# Patient Record
Sex: Female | Born: 1935
Health system: Southern US, Community
[De-identification: ages and names within clinical notes are randomized; demographics above are authoritative.]

## PROBLEM LIST (undated history)

## (undated) DIAGNOSIS — F329 Major depressive disorder, single episode, unspecified: Secondary | ICD-10-CM

## (undated) DIAGNOSIS — M1990 Unspecified osteoarthritis, unspecified site: Secondary | ICD-10-CM

## (undated) DIAGNOSIS — F32A Depression, unspecified: Secondary | ICD-10-CM

## (undated) HISTORY — DX: Depression, unspecified: F32.A

## (undated) HISTORY — PX: BREAST LUMPECTOMY: SHX2

## (undated) HISTORY — PX: ABDOMINAL HYSTERECTOMY: SHX81

## (undated) HISTORY — PX: OTHER SURGICAL HISTORY: SHX169

## (undated) HISTORY — DX: Major depressive disorder, single episode, unspecified: F32.9

## (undated) HISTORY — PX: EYE SURGERY: SHX253

---

## 2006-05-11 DIAGNOSIS — M47819 Spondylosis without myelopathy or radiculopathy, site unspecified: Secondary | ICD-10-CM | POA: Insufficient documentation

## 2007-04-04 ENCOUNTER — Ambulatory Visit: Payer: Self-pay | Admitting: Pain Medicine

## 2007-04-12 ENCOUNTER — Ambulatory Visit: Payer: Self-pay | Admitting: Pain Medicine

## 2007-08-26 ENCOUNTER — Ambulatory Visit: Payer: Self-pay | Admitting: Physician Assistant

## 2008-06-25 ENCOUNTER — Ambulatory Visit: Payer: Self-pay | Admitting: General Practice

## 2008-06-25 ENCOUNTER — Other Ambulatory Visit: Payer: Self-pay

## 2008-07-11 ENCOUNTER — Inpatient Hospital Stay: Payer: Self-pay | Admitting: General Practice

## 2008-07-18 ENCOUNTER — Ambulatory Visit: Payer: Self-pay | Admitting: General Practice

## 2009-03-26 ENCOUNTER — Ambulatory Visit: Payer: Self-pay | Admitting: General Practice

## 2009-04-08 ENCOUNTER — Inpatient Hospital Stay: Payer: Self-pay | Admitting: General Practice

## 2009-08-02 HISTORY — PX: KNEE SURGERY: SHX244

## 2009-12-19 ENCOUNTER — Ambulatory Visit: Payer: Self-pay | Admitting: Internal Medicine

## 2009-12-19 ENCOUNTER — Emergency Department: Payer: Self-pay | Admitting: Emergency Medicine

## 2009-12-25 ENCOUNTER — Ambulatory Visit: Payer: Self-pay | Admitting: Otolaryngology

## 2010-07-16 IMAGING — CT CT MAXILLOFACIAL WITHOUT CONTRAST
1 series · 16 of 30 positions shown, 20 images · non-contrast
Comparison: none

REASON FOR EXAM: s/p fall ecchymosis of right orbital area
COMMENTS:

[Series 2: facial 3.0 h60f · axial · 0.31mm/px · z∈[+1036,+1184]mm · 16 of 53 slices shown, 20 images]
[im 2/53  brain]
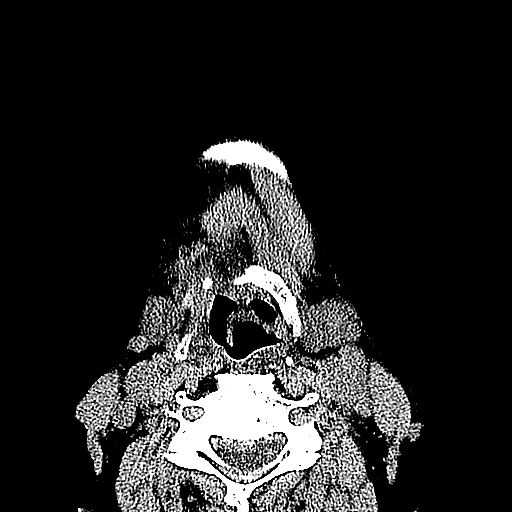
[im 2/53  bone]
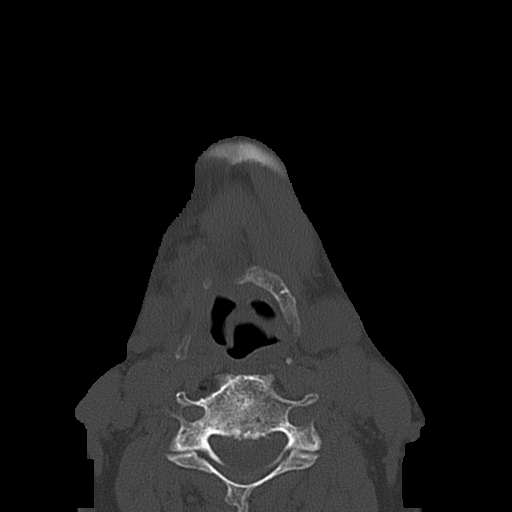
[im 6/53  bone]
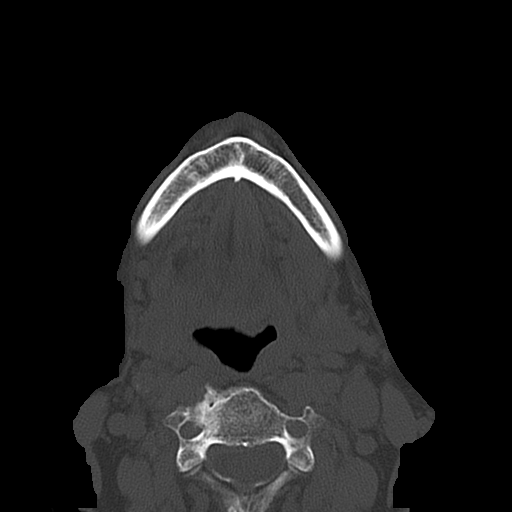
[im 9/53  bone]
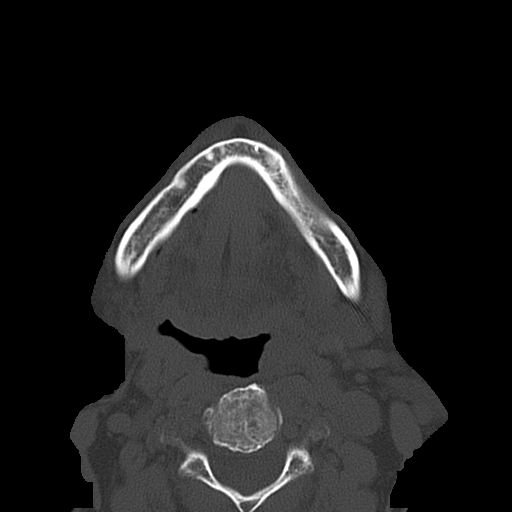
[im 13/53  bone]
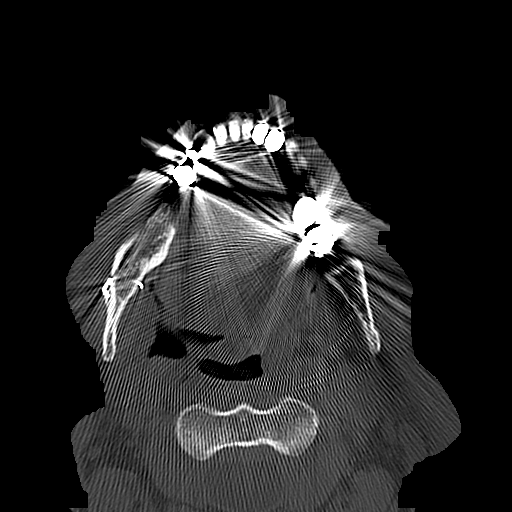
[im 15/53  brain]
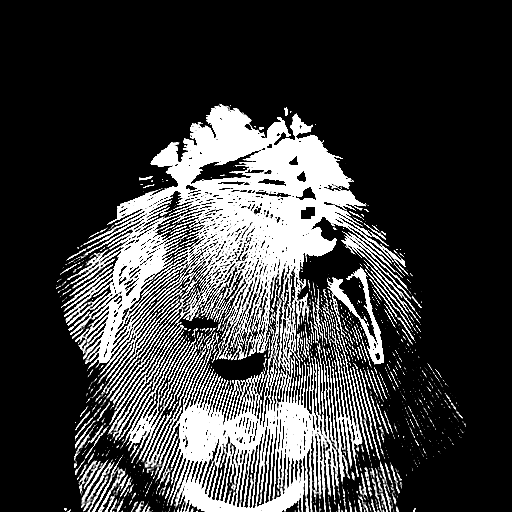
[im 15/53  bone]
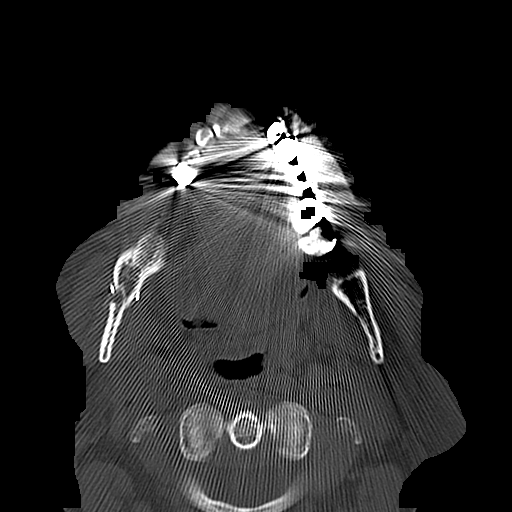
[im 18/53  bone]
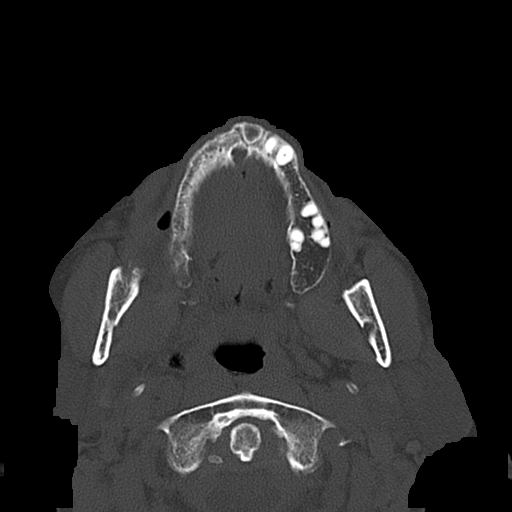
[im 22/53  bone]
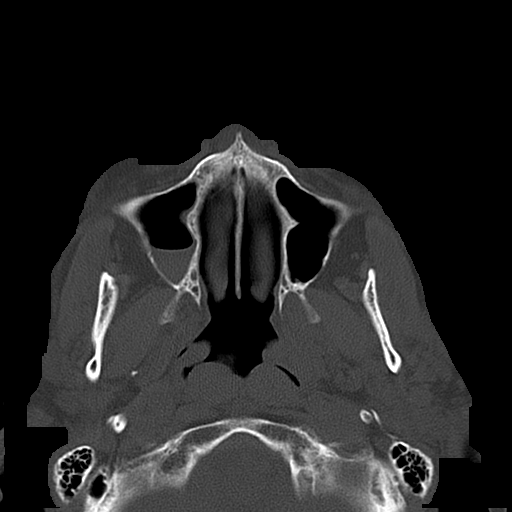
[im 26/53  bone]
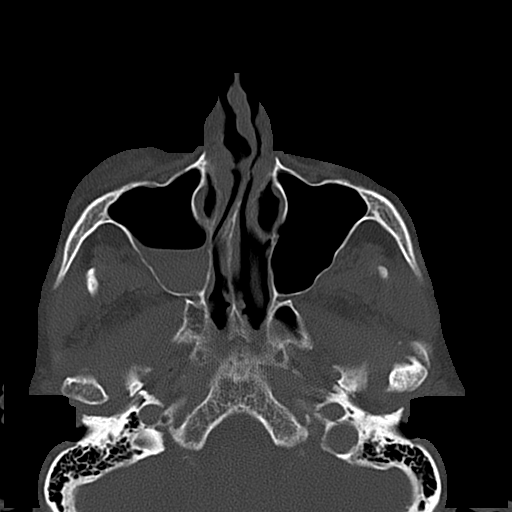
[im 27/53  brain]
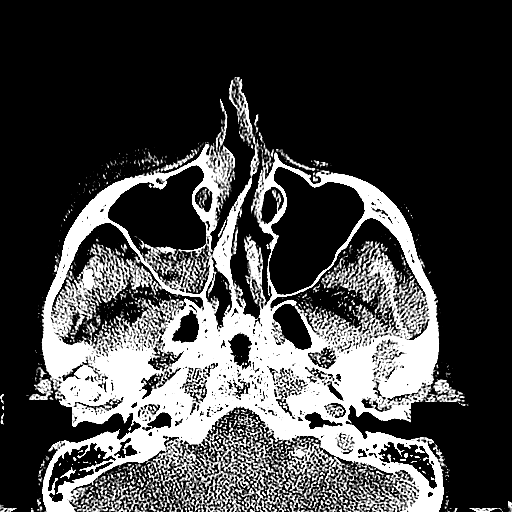
[im 27/53  bone]
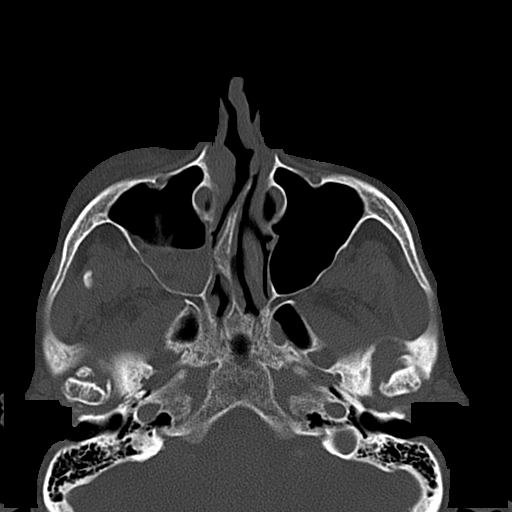
[im 31/53  bone]
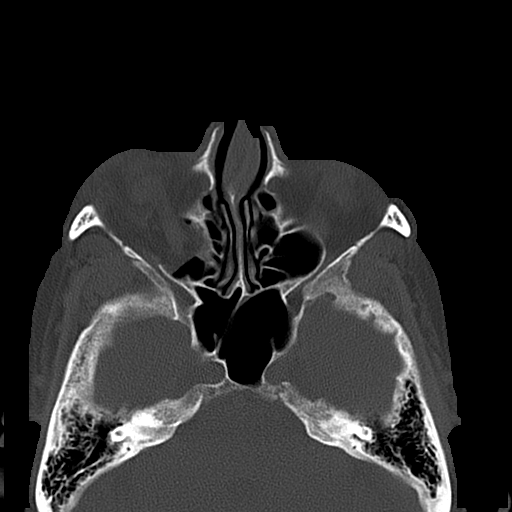
[im 35/53  bone]
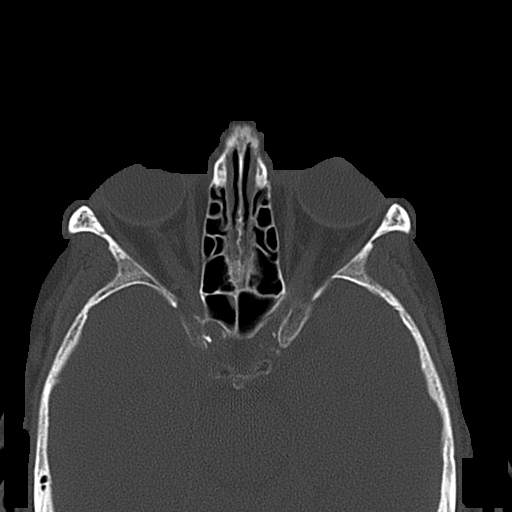
[im 38/53  bone]
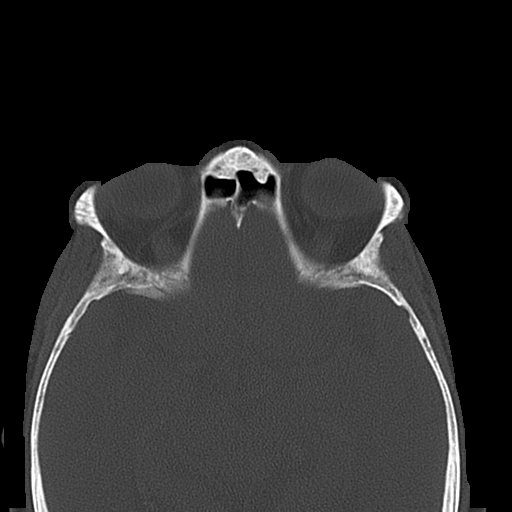
[im 40/53  brain]
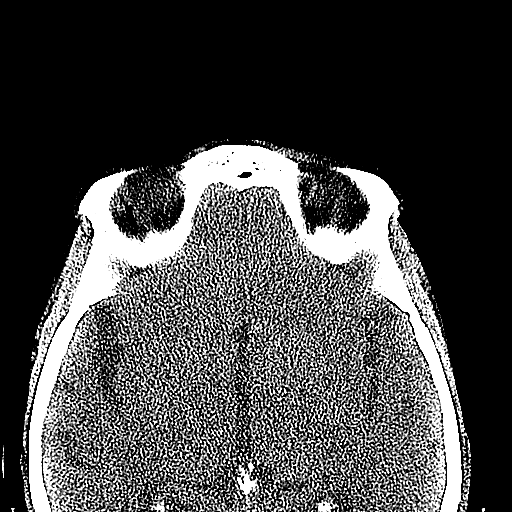
[im 40/53  bone]
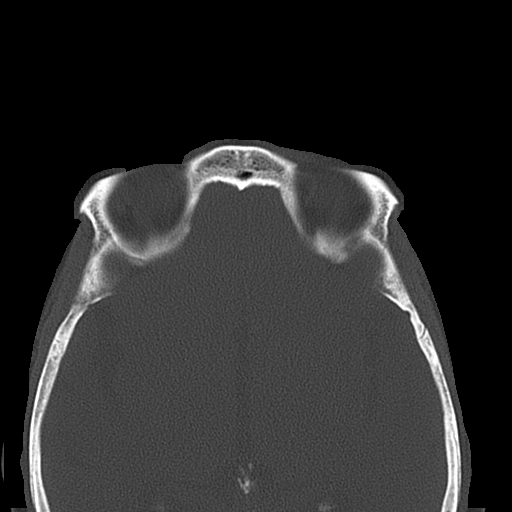
[im 44/53  bone]
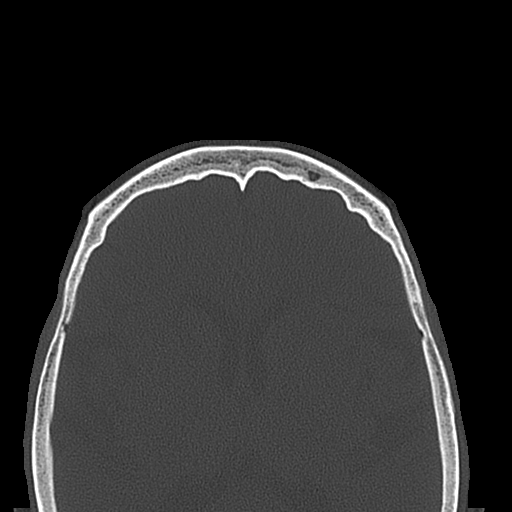
[im 47/53  bone]
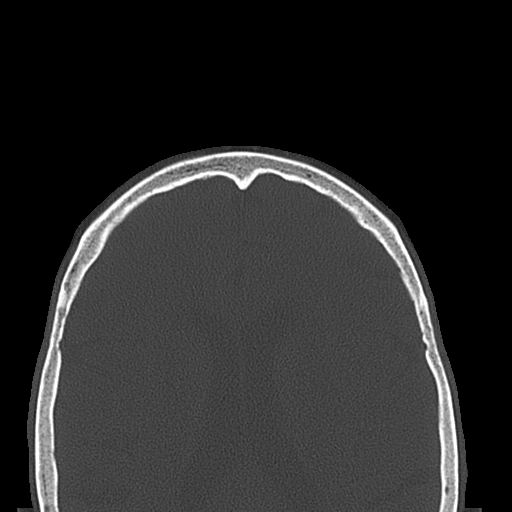
[im 51/53  bone]
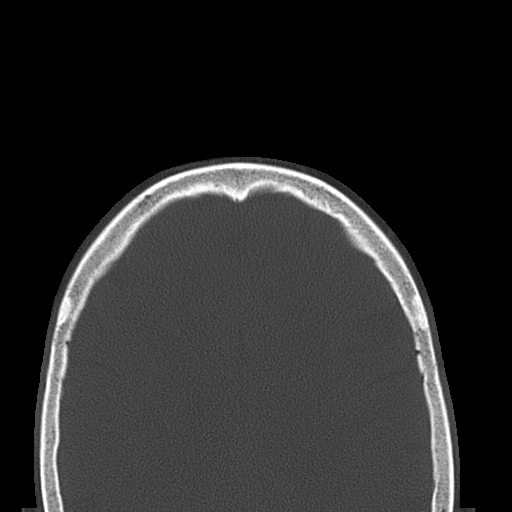

[16 of 30 positions shown; findings below may reference images not displayed]

PROCEDURE:     CT  - CT MAXILLOFACIAL AREA WO  - December 19, 2009  [DATE]

RESULT:     Multislice helical acquisition through the maxillofacial
structures is reconstructed in the axial and coronal planes at bone window
settings at 3 mm slice thickness. There is no previous exam for comparison.

There is an air-fluid level in the right maxillary sinus there is a
depressed orbital floor fracture on the right without evidence of
impingement. The orbital walls are otherwise intact. The sinuses are
otherwise normally formed. The pterygoid plates are intact. The mandible is
intact.
IMPRESSION: Right orbital floor fracture with depressed fragments. No
definite impingement is seen area air-fluid level is seen in the right
maxillary sinus.

## 2010-10-31 ENCOUNTER — Ambulatory Visit: Payer: Self-pay | Admitting: Internal Medicine

## 2010-11-19 ENCOUNTER — Ambulatory Visit: Payer: Self-pay | Admitting: Internal Medicine

## 2010-12-03 ENCOUNTER — Ambulatory Visit: Payer: Self-pay | Admitting: Internal Medicine

## 2011-01-01 ENCOUNTER — Ambulatory Visit: Payer: Self-pay | Admitting: Internal Medicine

## 2011-05-01 ENCOUNTER — Other Ambulatory Visit: Payer: Self-pay | Admitting: Orthopedic Surgery

## 2011-05-01 ENCOUNTER — Other Ambulatory Visit (HOSPITAL_COMMUNITY): Payer: Self-pay | Admitting: Orthopedic Surgery

## 2011-05-01 ENCOUNTER — Encounter (HOSPITAL_COMMUNITY): Payer: Medicare Other

## 2011-05-01 ENCOUNTER — Ambulatory Visit (HOSPITAL_COMMUNITY)
Admission: RE | Admit: 2011-05-01 | Discharge: 2011-05-01 | Disposition: A | Discharge status: Home / Self Care | Payer: Medicare Other | Source: Ambulatory Visit | Attending: Orthopedic Surgery | Admitting: Orthopedic Surgery

## 2011-05-01 DIAGNOSIS — M1610 Unilateral primary osteoarthritis, unspecified hip: Secondary | ICD-10-CM | POA: Insufficient documentation

## 2011-05-01 DIAGNOSIS — M25559 Pain in unspecified hip: Secondary | ICD-10-CM | POA: Insufficient documentation

## 2011-05-01 DIAGNOSIS — Z01818 Encounter for other preprocedural examination: Secondary | ICD-10-CM

## 2011-05-01 DIAGNOSIS — M169 Osteoarthritis of hip, unspecified: Secondary | ICD-10-CM | POA: Insufficient documentation

## 2011-05-01 DIAGNOSIS — Z01812 Encounter for preprocedural laboratory examination: Secondary | ICD-10-CM | POA: Insufficient documentation

## 2011-05-01 DIAGNOSIS — M79609 Pain in unspecified limb: Secondary | ICD-10-CM | POA: Insufficient documentation

## 2011-05-01 LAB — COMPREHENSIVE METABOLIC PANEL
AST: 17 U/L (ref 0–37)
Albumin: 3.9 g/dL (ref 3.5–5.2)
BUN: 18 mg/dL (ref 6–23)
Calcium: 9.1 mg/dL (ref 8.4–10.5)
Creatinine, Ser: 0.73 mg/dL (ref 0.50–1.10)
Total Bilirubin: 1.7 mg/dL — ABNORMAL HIGH (ref 0.3–1.2)
Total Protein: 6 g/dL (ref 6.0–8.3)

## 2011-05-01 LAB — URINALYSIS, ROUTINE W REFLEX MICROSCOPIC
Bilirubin Urine: NEGATIVE
Leukocytes, UA: NEGATIVE
Nitrite: NEGATIVE
Specific Gravity, Urine: 1.026 (ref 1.005–1.030)
Urobilinogen, UA: 0.2 mg/dL (ref 0.0–1.0)
pH: 6 (ref 5.0–8.0)

## 2011-05-01 LAB — PROTIME-INR
INR: 1.06 (ref 0.00–1.49)
Prothrombin Time: 14 seconds (ref 11.6–15.2)

## 2011-05-01 LAB — CBC
MCV: 98.7 fL (ref 78.0–100.0)
Platelets: 155 10*3/uL (ref 150–400)
RBC: 4.71 MIL/uL (ref 3.87–5.11)
RDW: 12.7 % (ref 11.5–15.5)
WBC: 3.8 10*3/uL — ABNORMAL LOW (ref 4.0–10.5)

## 2011-05-01 LAB — SURGICAL PCR SCREEN
MRSA, PCR: NEGATIVE
Staphylococcus aureus: POSITIVE — AB

## 2011-05-01 LAB — APTT: aPTT: 32 seconds (ref 24–37)

## 2011-05-13 ENCOUNTER — Inpatient Hospital Stay (HOSPITAL_COMMUNITY)
Admission: RE | Admit: 2011-05-13 | Discharge: 2011-05-16 | DRG: 470 | Disposition: A | Discharge status: Skilled Nursing Facility | Payer: Medicare Other | Source: Ambulatory Visit | Attending: Orthopedic Surgery | Admitting: Orthopedic Surgery

## 2011-05-13 DIAGNOSIS — M810 Age-related osteoporosis without current pathological fracture: Secondary | ICD-10-CM | POA: Diagnosis present

## 2011-05-13 DIAGNOSIS — Z96659 Presence of unspecified artificial knee joint: Secondary | ICD-10-CM

## 2011-05-13 DIAGNOSIS — H9190 Unspecified hearing loss, unspecified ear: Secondary | ICD-10-CM | POA: Diagnosis present

## 2011-05-13 DIAGNOSIS — K219 Gastro-esophageal reflux disease without esophagitis: Secondary | ICD-10-CM | POA: Diagnosis present

## 2011-05-13 DIAGNOSIS — M1610 Unilateral primary osteoarthritis, unspecified hip: Principal | ICD-10-CM | POA: Diagnosis present

## 2011-05-13 DIAGNOSIS — M169 Osteoarthritis of hip, unspecified: Principal | ICD-10-CM | POA: Diagnosis present

## 2011-05-13 DIAGNOSIS — Z01812 Encounter for preprocedural laboratory examination: Secondary | ICD-10-CM

## 2011-05-13 DIAGNOSIS — Z96649 Presence of unspecified artificial hip joint: Secondary | ICD-10-CM

## 2011-05-13 HISTORY — PX: HIP SURGERY: SHX245

## 2011-05-13 LAB — TYPE AND SCREEN
ABO/RH(D): O POS
Antibody Screen: NEGATIVE

## 2011-05-14 LAB — BASIC METABOLIC PANEL
CO2: 29 mEq/L (ref 19–32)
Calcium: 8.1 mg/dL — ABNORMAL LOW (ref 8.4–10.5)
Creatinine, Ser: 0.81 mg/dL (ref 0.50–1.10)
GFR calc Af Amer: >60 mL/min (ref >60)

## 2011-05-14 LAB — CBC
MCH: 33.4 pg (ref 26.0–34.0)
MCV: 98.7 fL (ref 78.0–100.0)
Platelets: 112 10*3/uL — ABNORMAL LOW (ref 150–400)
RDW: 12.5 % (ref 11.5–15.5)

## 2011-05-15 ENCOUNTER — Encounter: Payer: Self-pay | Admitting: Internal Medicine

## 2011-05-15 LAB — CBC
MCHC: 34.6 g/dL (ref 30.0–36.0)
MCV: 98.6 fL (ref 78.0–100.0)
Platelets: 114 10*3/uL — ABNORMAL LOW (ref 150–400)
RDW: 12.9 % (ref 11.5–15.5)
WBC: 3.2 10*3/uL — ABNORMAL LOW (ref 4.0–10.5)

## 2011-05-15 LAB — BASIC METABOLIC PANEL
Chloride: 107 mEq/L (ref 96–112)
Creatinine, Ser: 0.71 mg/dL (ref 0.50–1.10)
GFR calc Af Amer: >60 mL/min (ref >60)
GFR calc non Af Amer: >60 mL/min (ref >60)

## 2011-05-16 LAB — CBC
HCT: 28 % — ABNORMAL LOW (ref 36.0–46.0)
Platelets: 113 10*3/uL — ABNORMAL LOW (ref 150–400)
RDW: 12.9 % (ref 11.5–15.5)
WBC: 3.5 10*3/uL — ABNORMAL LOW (ref 4.0–10.5)

## 2011-05-16 LAB — BASIC METABOLIC PANEL
BUN: 11 mg/dL (ref 6–23)
Chloride: 108 mEq/L (ref 96–112)
GFR calc Af Amer: >60 mL/min (ref >60)
GFR calc non Af Amer: >60 mL/min (ref >60)
Potassium: 4.2 mEq/L (ref 3.5–5.1)
Sodium: 139 mEq/L (ref 135–145)

## 2011-05-18 NOTE — Discharge Summary (Signed)
Angela Young, Angela Young                ACCOUNT NO.:  [id number]  MEDICAL RECORD NO.:  [id number]  LOCATION:  1611                         FACILITY:  [facility]  PHYSICIAN:  Ollen Gross, M.D.    DATE OF BIRTH:  1931-07-05  DATE OF ADMISSION:  05/13/2011 DATE OF DISCHARGE:                        DISCHARGE SUMMARY - REFERRING   TENTATIVE DATE OF DISCHARGE:  May 16, 2011.  ADMITTING DIAGNOSES: 1. Osteoarthritis, right hip. 2. Impaired hearing. 3. Hiatal hernia. 4. Acid reflux. 5. Osteoporosis. 6. Postmenopausal.  DISCHARGE DIAGNOSES: 1. Osteoarthritis, right hip, status post right total hip replacement     arthroplasty. 2. Mild postop hypokalemia. 3. Impaired hearing. 4. Hiatal hernia. 5. Acid reflux. 6. Osteoporosis. 7. Postmenopausal.  PROCEDURE:  May 13, 2011, right total hip.  Surgeon, Dr. Lequita Halt. Assistant, Alexzandrew L. Perkins, P.A.C.  Surgery performed under anesthesia.  CONSULTS:  None.  BRIEF HISTORY:  [name] is a 77 year old female with end-stage arthritis of the right hip with progressive worsening pain and dysfunction.  She failed nonoperative management, now presents for total hip arthroplasty.  LABORATORY DATA:  CBC on admission showed hemoglobin of 15.3, hematocrit of 46.5 white cell count 30.8, platelets 155.  PT/INR 14.0 and 1.06 with PTT of 32.  Chem panel on admission, slightly elevated total bili of 1.7.  Remaining Chem panel all within normal limits.  Preop UA negative. Blood group type O+.  Nasal swabs were positive staph aureus but negative for MRSA.  Serial CBCs were followed.  Hemoglobin dropped down to 10.6 on postop day #2, last noted H and H was 10.1 and 29.2.  Please note, final CBC is pending on the tentative date of discharge.  Serial BMETs were followed for 48 hours.  Potassium dropped from 4.1 to 3.4. She was given oral potassium supplements.  Please note final BMET is pending and will be done on the tentative date of  discharge.  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  EKG, fax copy dated February 26, 2011, sinus rhythm, borderline, abnormal changes possibly due to myocardial ischemia.  HOSPITAL COURSE:  The patient was admitted to [facility], taken to the operating room, underwent the above-stated procedure without complications.  The patient tolerated the procedure well, later transferred to the recovery room on orthopedic floor, started on p.o. and IV analgesia pain control following surgery, 24 hours postop IV antibiotics.  She was started on Xarelto for DVT prophylaxis.  She was seen on rounds on the morning of day #1 and her daughter and granddaughter were both in the room with her.  She was doing pretty well.  She initially wanted look into a skilled facility, possibly [facility]	[place], so we got social work involved.  We started getting her up, partial weightbearing 25-50% to the right lower extremity.  Hemovac drain placed on the surgery was pulled.  Hemoglobin looked good.  She was diuresing fluids very well.  By day #2, she had already walked over 90 feet with therapy and progressing very well.  We had not heard about the bed yet but tentatively she might be ready to go on Saturday  which would be postop day #3 and on tomorrow but we would have to find out if the bed was available and if the facility would accept discharges over the weekend; if not, she would stay through the weekend receiving therapy each day which she was progressing very well with at this point and possibly either transferred out on Saturday or Monday.  TENTATIVE DISCHARGE PLAN: 1. Possible discharge tomorrow on Saturday, May 16, 2011, depending     upon bed availability and facility ability to accept transfers on     Saturday. 2. Discharge diagnoses, please see above. 3. Discharge meds.  Current medications at  time of dictation: 1. Xarelto 10 mg p.o. daily for 3 weeks, then discontinue the Xarelto. 2. Colace 100 mg p.o. b.i.d. 3. OxyIR 10 mg to 20 mg p.o. q.4-6 h. as needed for severe pain. 4. Robaxin 500 mg p.o. q.6 h. p.r.n. spasm. 5. Benadryl 25 mg p.o. every 6 hours p.r.n. itching. 6. Zofran 4 mg p.o. q.6 h p.r.n. nausea. 7. Tylenol 325 one or two every 4 to 6 hours as needed for mild pain,     temperature or headache. 8. Pepcid AC 10 mg p.o. daily p.r.n. acid reflux/heartburn.  DIET:  As tolerated.  ACTIVITY:  She is partial weightbearing, 25-50% to the right lower extremity.  Hip precautions, total hip protocol.  PT and OT for gait training, ambulation, ADLs, range of motion and strengthening exercises along with hip precautions.  FOLLOW-UP:  Two weeks in the office of Dr. Lequita Halt.  Please contact the office at [phone] to help arrange appointment follow-up care of this patient.  DISPOSITION:  Pending at time of dictation.  CONDITION ON DISCHARGE:  Pending at time of dictation but she will be transferred over either on Saturday or Monday to skilled facility if improved.     Alexzandrew L. Julien Girt, P.A.C.   ______________________________ Ollen Gross, M.D.    ALP/MEDQ  D:  05/15/2011  T:  05/15/2011  Job:  [id number]  Electronically Signed by Patrica Duel P.A.C. on 05/15/2011 09:38:24 AM Electronically Signed by Ollen Gross M.D. on 05/18/2011 06:51:59 AM

## 2011-05-18 NOTE — Op Note (Signed)
Angela Young, Angela Young                ACCOUNT NO.:  [id number]  MEDICAL RECORD NO.:  [id number]  LOCATION:  0004                         FACILITY:  [facility]  PHYSICIAN:  Ollen Gross, M.D.    DATE OF BIRTH:  January 19, 1931  DATE OF PROCEDURE:  05/13/2011 DATE OF DISCHARGE:                              OPERATIVE REPORT   PREOPERATIVE DIAGNOSIS:  Osteoarthritis, right hip.  POSTOPERATIVE DIAGNOSIS:  Osteoarthritis, right hip.  PROCEDURE:  Right total hip arthroplasty.  SURGEON:  Ollen Gross, M.D.  ASSISTANT:  Alexzandrew L. Perkins, P.A.C.  ANESTHESIA:  General.  ESTIMATED BLOOD LOSS:  300.  DRAIN:  Hemovac x1.  COMPLICATIONS:  None.  CONDITION:  Stable to recovery.  BRIEF CLINICAL NOTE:  Ms. Dieterich is a 77 year old female with end-stage arthritis of the right hip with progressively worsening pain and dysfunction.  She has failed postoperative management and presents for total hip arthroplasty.  PROCEDURE IN DETAIL:  After successful administration of general anesthetic, the patient was placed in the left lateral decubitus position with the right side up and held with the hip positioner.  Right lower extremity was isolated from perineum with plastic drapes and prepped and draped in the usual sterile fashion.  Short posterolateral incision was made with a 10 blade through subcutaneous tissue to the fascia lata which was incised in line with the skin incision.  Sciatic nerve was palpated and protected and short rotators and capsule isolated off the femur.  Hip was dislocated and the center of femoral head is marked.  Trial prosthesis was placed such that the center of the trial head corresponds to the center of native femoral head.  Osteotomy line was marked on femoral neck and osteotomy made with an oscillating saw.  The femoral retractors were placed around the proximal femur for access to the canal.  Canal finder was passed into the canal and the canal was thoroughly  irrigated to remove fatty contents.  The lateralizing reamer was placed and then we began broaching all the way up to size 7 for Summit cemented stem.  The calcar planer was then passed over the resection level to make a smooth transition.  The femur was then retracted anteriorly to gain acetabular exposure.  Acetabular retractors were placed and labrum and osteophytes removed.  Reaming was performed up to 55 mm for placement of 56 mm pinnacle acetabular shell.  The shell was impacted in anatomic position.  It had excellent purchase, we did not need any additional dome screws.  The apex hole eliminator was then placed and then the 36 mm neutral +4 marathon liner was placed.  The trial stem was placed which is the 7 and we started with a high offset neck and 36 +1.5 head.  With the high offset, the soft tissues were way too tight fit, so had to go with standard offset which was placed with the 36 +1.5 head.  The hip was reduced with excellent soft tissue tension and excellent stability.  She had full extension, full external rotation, 70 degrees flexion, 40 degrees adduction, 90 degrees internal rotation, 90 degrees of flexion and 70 degrees of internal rotation. Placing the right leg on top of the  left, leg lengths were equal.  Hip was then dislocated and trial was removed.  I sized with a cement restrictor and size 5 was most appropriate.  The size 5 cement restrictor was placed to appropriate depth in the femoral canal.  The canal was then prepared with pulsatile lavage.  While this was occurring, the cement was mixed.  Once ready for implantation the canal was dried out, the cement injected into the femoral canal and pressurized.  The size 7 standard offset Summit cemented stem was then impacted into the femoral canal and cemented around 15-20 degrees of anteversion, matching the patient's native anatomy.  Once cement had fully hardened, then the 36+ 1.5 head was placed on to the  femoral neck and the hip was reduced with the same stability parameters.  The wound was copiously irrigated with saline solution and the short rotators and capsule reattached to the femur through drill holes with Ethibond suture.  Fascia lata was closed over Hemovac drain with interrupted #1 Vicryl, subcu closed with #1 and #2-0 Vicryl and subcuticular running 4- 0 Monocryl.  The catheter for Marcaine pain pump was placed and the pump initiated.  The incisions were cleaned and dried and Steri-Strips and a bulky sterile dressing were applied.  She was placed into a knee immobilizer, awakened and transported to recovery in stable condition.     Ollen Gross, M.D.     FA/MEDQ  D:  05/13/2011  T:  05/13/2011  Job:  [id number]  Electronically Signed by Ollen Gross M.D. on 05/18/2011 06:51:57 AM

## 2011-05-18 NOTE — H&P (Signed)
NAMEWANIYA, HOGLUND                ACCOUNT NO.:  [id number]  MEDICAL RECORD NO.:  [id number]  LOCATION:                                 FACILITY:  PHYSICIAN:  Ollen Gross, M.D.    DATE OF BIRTH:  26-Feb-1931  DATE OF ADMISSION:  05/13/2011 DATE OF DISCHARGE:                             HISTORY & PHYSICAL   CHIEF COMPLAINT:  Right hip pain.  HISTORY OF PRESENT ILLNESS:  [name] is a 77 year old female who was seen in evaluation by Dr. Lequita Halt for ongoing right hip pain.  It started in the groin, it is radiating down towards the knee.  She has had progressive pain and limiting function due to the hip.  She has been seen in the office where x-rays showed advanced end-stage arthritis of the hip with some collapse of the femoral head, some bone-on-bone changes.  She does have a left hip with a well-placed AML stem.  It is felt she would benefit from undergoing right hip replacement.  She has been seen preoperatively by Dr. Lady Gary and felt to be at low-risk from cardiac standpoint for upcoming surgery.  ALLERGIES:  ETHER.  CURRENT MEDICATIONS:  Oxycodone and "energy vitamin."  PAST MEDICAL HISTORY: 1. Impaired hearing. 2. Hiatal hernia. 3. Acid reflux. 4. Osteoporosis. 5. Postmenopausal.  PAST SURGICAL HISTORY:  She has had left total hip in 2009, left total knee in 2010, hysterectomy, hemorrhoidectomy, cyst excision from breast, right eye surgery, and a tumor removal from her throat.  FAMILY HISTORY:  Father with bone cancer.  Mother with a history of hip fracture.  SOCIAL HISTORY:  Divorced, retired Associate Professor, past smoker.  No alcohol.  She lives alone.  She does want to look into a rehab facility.  REVIEW OF SYSTEMS:  GENERAL:  No fevers, chills or night sweats.  NEURO: Little bit of double vision and insomnia.  No seizures, syncope, or paralysis.  RESPIRATORY:  No shortness breath, productive cough, or hemoptysis.  CARDIOVASCULAR:  No chest pain, no orthopnea.   GI:  No nausea, vomiting, diarrhea, or constipation.  GU:  No dysuria, hematuria, or discharge.  MUSCULOSKELETAL: Hip pain.  PHYSICAL EXAMINATION:  VITAL SIGNS:  Pulse 68, respirations 14, blood pressure 132/78. GENERAL:  A 77 year old white female, well nourished, well developed, short stature, alert, oriented and cooperative.  She is accompanied by her granddaughter. HEENT:  Normocephalic, atraumatic.  Pupils round and reactive.  EOMs intact.  Never wore glasses.  Does have upper and lower dentures. NECK:  Supple. CHEST:  Clear. HEART:  Regular rate and rhythm without murmur. ABDOMEN:  Soft, nontender.  Bowel sounds present. RECTAL:  Not done, not pertinent to present illness. BREASTS:  Not done, not pertinent to present illness. GENITALIA:  Not done, not pertinent to present illness. EXTREMITIES:  Right hip flexion 90, 0 internal rotation, 20 degrees external rotation, about 20 degrees abduction.  IMPRESSION:  Osteoarthritis, right hip.  PLAN:  The patient will be admitted to [facility] to undergo right total hip replacement arthroplasty.  Surgery will be performed by Dr. Ollen Gross.     Alexzandrew L. Julien Girt, P.A.C.   ______________________________ Ollen Gross, M.D.    ALP/MEDQ  D:  05/10/2011  T:  05/10/2011  Job:  [id number]  cc:   Lorie Phenix, MD Fax: [phone]  Harold Hedge, MD Fax: [phone]  Electronically Signed by Patrica Duel P.A.C. on 05/14/2011 10:43:47 AM Electronically Signed by Ollen Gross M.D. on 05/18/2011 06:51:55 AM

## 2011-06-03 ENCOUNTER — Encounter: Payer: Self-pay | Admitting: Internal Medicine

## 2011-11-26 IMAGING — CR DG HIP COMPLETE 2+V*R*
3 series · 3 of 3 positions shown · non-contrast
Comparison: None.

CLINICAL DATA: Right hip and leg pain.  Preoperative evaluation.

RIGHT HIP - COMPLETE 2+ VIEW

[t pelvis a.p.]
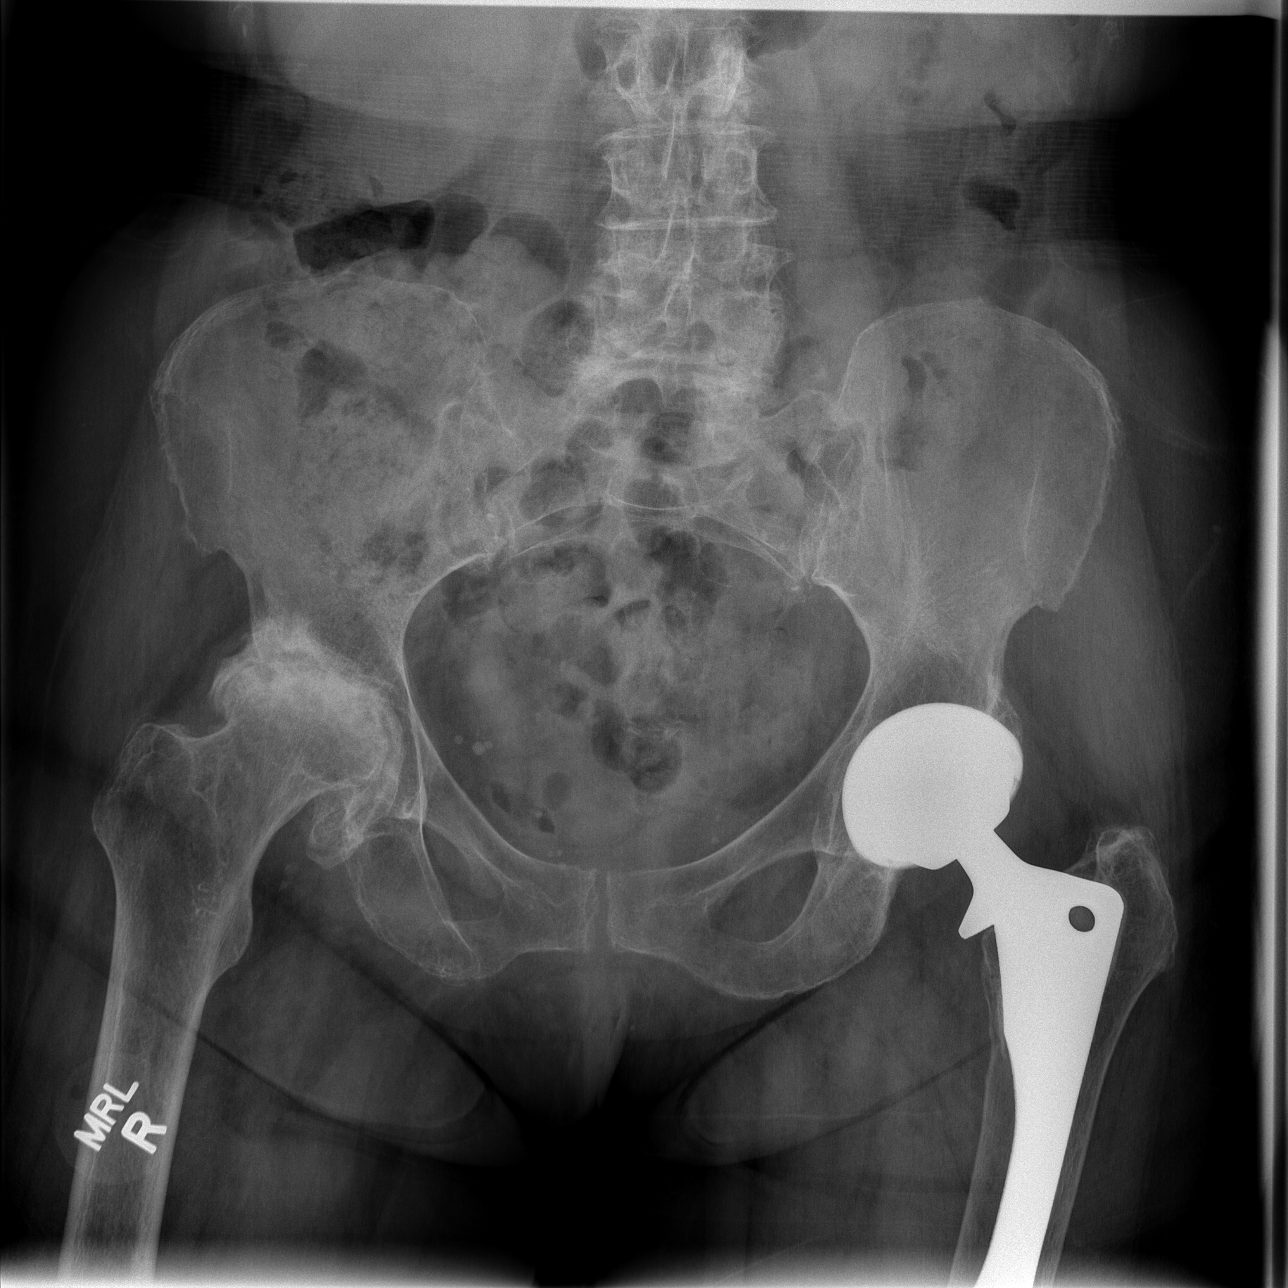

[t hip ap right]
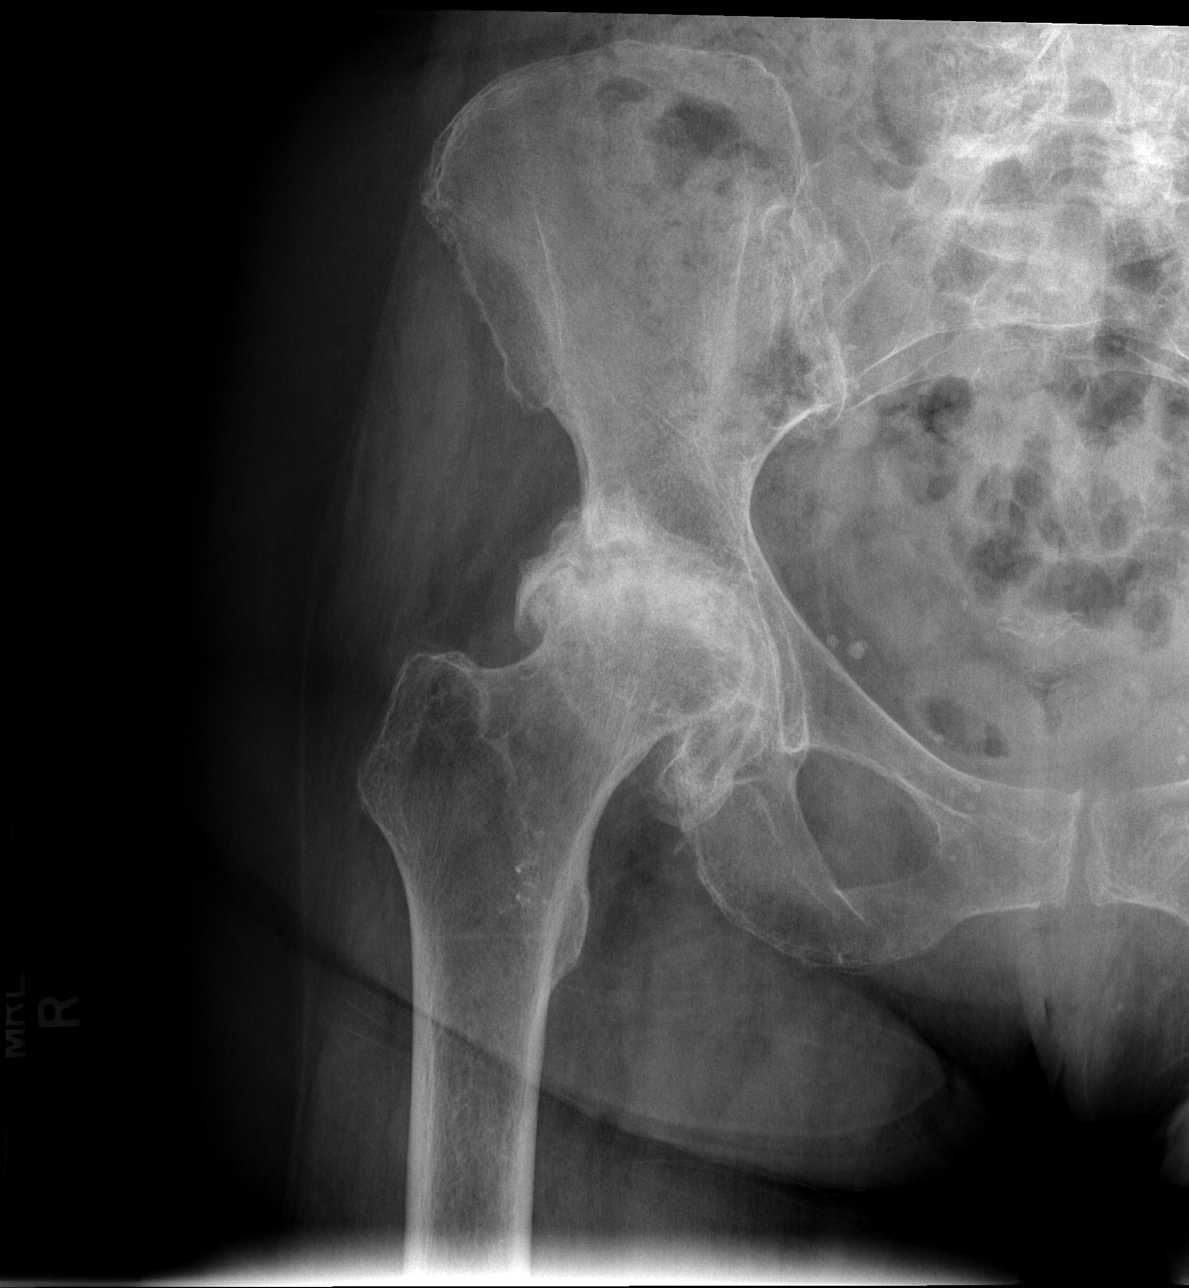

[t hip frog leg right]
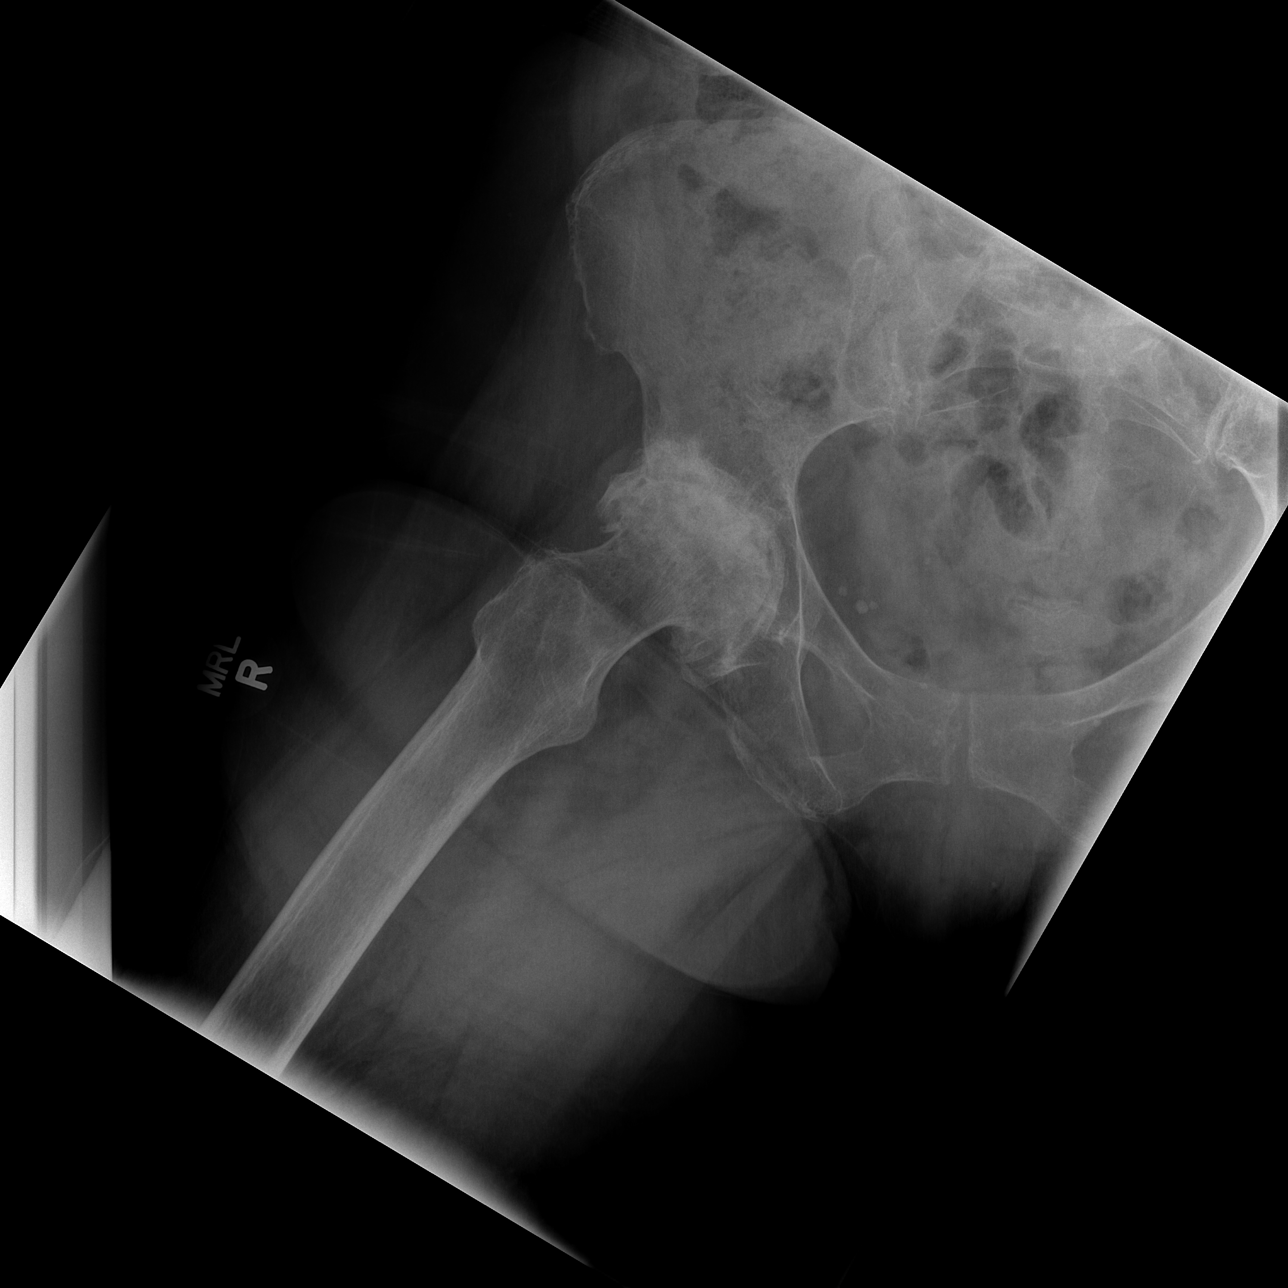

[3 of 3 positions shown; findings below may reference images not displayed]

FINDINGS: Severe right hip superior joint space narrowing with
extensive subarticular sclerosis and lucency with bony remodeling
and flattening of the femoral head. Extensive spur formation.  Left
total hip prosthesis in satisfactory position and alignment.  Lower
lumbar spine degenerative changes and mild scoliosis.
IMPRESSION: Severe right hip degenerative changes with probable underlying
avascular necrosis of the femoral head.

## 2015-04-30 ENCOUNTER — Other Ambulatory Visit: Payer: Self-pay | Admitting: Family Medicine

## 2015-04-30 DIAGNOSIS — M1990 Unspecified osteoarthritis, unspecified site: Secondary | ICD-10-CM | POA: Insufficient documentation

## 2015-04-30 MED ORDER — OXYCODONE HCL 10 MG PO TABS: ORAL_TABLET | ORAL | Status: DC

## 2015-04-30 MED ORDER — OXYCODONE HCL 10 MG PO TABS
ORAL_TABLET | ORAL | Status: DC
Start: 2015-04-30 — End: 2015-08-02

## 2015-04-30 NOTE — Telephone Encounter (Signed)
Patient advised to pick up prescriptions at front desk.

## 2015-04-30 NOTE — Telephone Encounter (Signed)
Pt contacted office for refill request on the following medications:  Oxycodone HCI . Pt is requesting a 90 day supply.  CB#[id number]

## 2015-08-02 ENCOUNTER — Other Ambulatory Visit: Payer: Self-pay | Admitting: Family Medicine

## 2015-08-02 DIAGNOSIS — M1990 Unspecified osteoarthritis, unspecified site: Secondary | ICD-10-CM

## 2015-08-02 MED ORDER — OXYCODONE HCL 10 MG PO TABS: ORAL_TABLET | ORAL | Status: DC

## 2015-08-02 NOTE — Telephone Encounter (Signed)
Pt needs refill on her Oxycodone HCl 10 MG TABS 04/30/15 -- Lorie Phenix, MD 1 tablet four times daily Notes: To be filled after 06/30/2015   Her call back is [phone]  Thanks teri

## 2015-09-09 ENCOUNTER — Encounter: Payer: Self-pay | Admitting: Family Medicine

## 2015-09-09 ENCOUNTER — Other Ambulatory Visit: Payer: Self-pay

## 2015-09-09 ENCOUNTER — Ambulatory Visit (INDEPENDENT_AMBULATORY_CARE_PROVIDER_SITE_OTHER): Payer: Medicare Other | Admitting: Family Medicine

## 2015-09-09 VITALS — BP 144/80 | HR 76 | Temp 97.8°F | Resp 16 | Ht 61.0 in | Wt 137.0 lb

## 2015-09-09 DIAGNOSIS — N6009 Solitary cyst of unspecified breast: Secondary | ICD-10-CM | POA: Insufficient documentation

## 2015-09-09 DIAGNOSIS — R9431 Abnormal electrocardiogram [ECG] [EKG]: Secondary | ICD-10-CM | POA: Insufficient documentation

## 2015-09-09 DIAGNOSIS — J309 Allergic rhinitis, unspecified: Secondary | ICD-10-CM | POA: Insufficient documentation

## 2015-09-09 DIAGNOSIS — Z6826 Body mass index (BMI) 26.0-26.9, adult: Secondary | ICD-10-CM | POA: Insufficient documentation

## 2015-09-09 DIAGNOSIS — F329 Major depressive disorder, single episode, unspecified: Secondary | ICD-10-CM | POA: Diagnosis not present

## 2015-09-09 DIAGNOSIS — IMO0002 Reserved for concepts with insufficient information to code with codable children: Secondary | ICD-10-CM | POA: Insufficient documentation

## 2015-09-09 DIAGNOSIS — K449 Diaphragmatic hernia without obstruction or gangrene: Secondary | ICD-10-CM | POA: Insufficient documentation

## 2015-09-09 DIAGNOSIS — M1990 Unspecified osteoarthritis, unspecified site: Secondary | ICD-10-CM

## 2015-09-09 DIAGNOSIS — E559 Vitamin D deficiency, unspecified: Secondary | ICD-10-CM | POA: Insufficient documentation

## 2015-09-09 DIAGNOSIS — R5382 Chronic fatigue, unspecified: Secondary | ICD-10-CM

## 2015-09-09 DIAGNOSIS — L309 Dermatitis, unspecified: Secondary | ICD-10-CM

## 2015-09-09 DIAGNOSIS — D8989 Other specified disorders involving the immune mechanism, not elsewhere classified: Secondary | ICD-10-CM | POA: Insufficient documentation

## 2015-09-09 DIAGNOSIS — Z862 Personal history of diseases of the blood and blood-forming organs and certain disorders involving the immune mechanism: Secondary | ICD-10-CM | POA: Insufficient documentation

## 2015-09-09 DIAGNOSIS — G9332 Myalgic encephalomyelitis/chronic fatigue syndrome: Secondary | ICD-10-CM | POA: Insufficient documentation

## 2015-09-09 DIAGNOSIS — K219 Gastro-esophageal reflux disease without esophagitis: Secondary | ICD-10-CM | POA: Insufficient documentation

## 2015-09-09 DIAGNOSIS — F32A Depression, unspecified: Secondary | ICD-10-CM

## 2015-09-09 MED ORDER — MIRTAZAPINE 15 MG PO TABS: 15.0000 mg | ORAL_TABLET | Freq: Every day | ORAL | Status: DC

## 2015-09-09 MED ORDER — OXYCODONE HCL 10 MG PO TABS: ORAL_TABLET | ORAL | Status: DC

## 2015-09-09 NOTE — Progress Notes (Signed)
Subjective:    Patient ID: Angela Young, female    DOB: 08/13/1931, 81 y.o.   MRN: [id number]  Joint/Muscle Pain: Patient complains of arthralgias for which has been present for several years. Pain is located in multiple joints, is described as aching, and is severe .  Associated symptoms include: decreased range of motion and instability.  The patient has tried oxycodone 10 mg for pain, with moderate relief.  Related to injury:   No.  Meds help enough for her to do her thing.    Depression        This is a chronic problem.The problem is unchanged.  Associated symptoms include fatigue, helplessness, insomnia, irritable, restlessness, appetite change, body aches and myalgias.  Associated symptoms include no decreased concentration, no hopelessness, no decreased interest, no headaches, no indigestion, not sad and no suicidal ideas.  Treatments tried: Celexa.  Previous treatment provided no relief relief.  Past medical history includes chronic pain.    Also has had to get off of gluten for a skin problem.  Is better but not gone. Dermatologist says she has dermatitis.  Using one percent cortisone over the counter.     Review of Systems  Constitutional: Positive for appetite change and fatigue.  HENT: Positive for trouble swallowing.   Musculoskeletal: Positive for myalgias, arthralgias and gait problem.  Neurological: Negative for headaches.  Psychiatric/Behavioral: Positive for depression. Negative for suicidal ideas and decreased concentration. The patient has insomnia.    BP 144/80 mmHg  Pulse 76  Temp(Src) 97.8 F (36.6 C) (Oral)  Resp 16  Ht  (1.549 m)  Wt 137 lb (62.143 kg)  BMI 25.90 kg/m2   Patient Active Problem List   Diagnosis Date Noted  . Abnormal ECG 09/09/2015  . Allergic rhinitis 09/09/2015  . Benign cyst of breast 09/09/2015  . Body mass index (BMI) of 26.0-26.9 in adult 09/09/2015  . CFIDS (chronic fatigue and immune dysfunction syndrome) 09/09/2015  .  Clinical depression 09/09/2015  . Bladder cystocele 09/09/2015  . Acid reflux 09/09/2015  . 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 Polycythemia 09/09/2015  . Avitaminosis D 09/09/2015  . Chronic osteoarthritis 04/30/2015  . Spondylosis without myelopathy 05/11/2006   No past medical history on file. Current Outpatient Prescriptions on File Prior to Visit  Medication Sig  . Oxycodone HCl 10 MG TABS 1 tablet four times daily   No current facility-administered medications on file prior to visit.   Allergies  Allergen Reactions  . Ether    Depression screen PHQ 2/9 09/09/2015  Decreased Interest 1  Down, Depressed, Hopeless 3  PHQ - 2 Score 4  Altered sleeping 1  Tired, decreased energy 3  Change in appetite 1  Feeling bad or failure about yourself  0  Trouble concentrating 0  Moving slowly or fidgety/restless 0  Suicidal thoughts 0  PHQ-9 Score 9       Objective:   Physical Exam  Constitutional: She is oriented to person, place, and time. She appears well-developed and well-nourished. She is irritable.  Cardiovascular: Normal rate and regular rhythm.   Pulmonary/Chest: Effort normal and breath sounds normal.  Neurological: She is alert and oriented to person, place, and time.  Psychiatric: She has a normal mood and affect. Her behavior is normal. Judgment and thought content normal.   BP 144/80 mmHg  Pulse 76  Temp(Src) 97.8 F (36.6 C) (Oral)  Resp 16  Ht  (1.549 m)  Wt 137 lb (62.143 kg)  BMI 25.90 kg/m2  Assessment & Plan:  1. Dermatitis Continue hydrocortisone as needed.   2. Clinical depression Not controlled but patient is doing the best she can. Will try Mirtazepine. Call if worsens or does not improve.  - mirtazapine (REMERON) 15 MG tablet; Take 1 tablet (15 mg total) by mouth at bedtime.  Dispense: 30 tablet; Refill: 5  3. Chronic osteoarthritis Stable. Continue current medication and plan of care.    - Oxycodone HCl 10 MG TABS; 1 tablet four  times daily  Dispense: 120 tablet; Refill: 0  Lorie PhenixNancy Darlene Bartelt, MD

## 2015-11-27 ENCOUNTER — Other Ambulatory Visit: Payer: Self-pay | Admitting: Family Medicine

## 2015-11-27 DIAGNOSIS — M1990 Unspecified osteoarthritis, unspecified site: Secondary | ICD-10-CM

## 2015-11-27 MED ORDER — OXYCODONE HCL 10 MG PO TABS: ORAL_TABLET | ORAL | Status: DC

## 2015-11-27 NOTE — Telephone Encounter (Signed)
LOV 09/09/2015. Med to be refilled after 12/02/2015. Allene Dillon, CMA

## 2015-11-27 NOTE — Telephone Encounter (Signed)
Pt needs refill Oxycodone HCl 10 MG TABS 09/09/15 -- Lorie Phenix, MD 1 tablet four times daily   Thanks Barth Kirks

## 2015-12-06 ENCOUNTER — Telehealth: Payer: Self-pay | Admitting: Family Medicine

## 2015-12-06 ENCOUNTER — Other Ambulatory Visit: Payer: Self-pay

## 2015-12-06 DIAGNOSIS — M1990 Unspecified osteoarthritis, unspecified site: Secondary | ICD-10-CM

## 2015-12-06 MED ORDER — OXYCODONE HCL 10 MG PO TABS: ORAL_TABLET | ORAL | Status: DC

## 2015-12-06 NOTE — Telephone Encounter (Signed)
Prescriptions are on your desk to sign.  Thanks,   -Vernona Rieger

## 2015-12-06 NOTE — Telephone Encounter (Signed)
Ok to print out two more months. Thanks.

## 2015-12-06 NOTE — Telephone Encounter (Signed)
Signed. Thanks.

## 2015-12-06 NOTE — Telephone Encounter (Signed)
Pt called to pick up her Rx for Oxycodone HCl 10 MG TABS 11/27/15 -- Malva Limes, MD 1 tablet four times daily.  She said she picks up three months at a time and there was only one month ready.  She wants to know if she can get the other two months written before she comes this afternoon to pick up.  Her call back is  [phone]  Thanks Barth Kirks

## 2015-12-13 ENCOUNTER — Ambulatory Visit (INDEPENDENT_AMBULATORY_CARE_PROVIDER_SITE_OTHER): Payer: Medicare Other | Admitting: Family Medicine

## 2015-12-13 ENCOUNTER — Ambulatory Visit: Payer: Medicare Other | Admitting: Family Medicine

## 2015-12-13 ENCOUNTER — Encounter: Payer: Self-pay | Admitting: Family Medicine

## 2015-12-13 VITALS — BP 140/86 | HR 60 | Temp 97.6°F | Resp 16

## 2015-12-13 DIAGNOSIS — R350 Frequency of micturition: Secondary | ICD-10-CM

## 2015-12-13 DIAGNOSIS — R202 Paresthesia of skin: Secondary | ICD-10-CM | POA: Insufficient documentation

## 2015-12-13 DIAGNOSIS — E559 Vitamin D deficiency, unspecified: Secondary | ICD-10-CM | POA: Diagnosis not present

## 2015-12-13 DIAGNOSIS — R5382 Chronic fatigue, unspecified: Secondary | ICD-10-CM | POA: Diagnosis not present

## 2015-12-13 DIAGNOSIS — H9193 Unspecified hearing loss, bilateral: Secondary | ICD-10-CM

## 2015-12-13 LAB — POCT URINALYSIS DIPSTICK
BILIRUBIN UA: NEGATIVE
Blood, UA: NEGATIVE
Glucose, UA: NEGATIVE
KETONES UA: NEGATIVE
Leukocytes, UA: NEGATIVE
Nitrite, UA: NEGATIVE
PH UA: 6
PROTEIN UA: NEGATIVE
SPEC GRAV UA: 1.02
Urobilinogen, UA: 0.2

## 2015-12-13 NOTE — Patient Instructions (Addendum)
Please try over the counter Debrox for ear wax. Try over the counter Loratadine 10 mg one tablet daily for allergies.

## 2015-12-13 NOTE — Progress Notes (Signed)
   Subjective:    Patient ID: Angela Young, female    DOB: 04-06-1931, 82 y.o.   MRN: [id number]  HPI Fatigue: Patient complains of fatigue. Symptoms began several years ago. Sentinal symptom the patient feels fatigue began with: none. Symptoms of her fatigue have been general malaise and lack of interest in usual activities. Patient describes the following psychologic symptoms: depression.  Patient denies fever. Symptoms have gradually worsened. Severity has been struggles to carry out day to day responsibilities.. Previous visits for this problem: none.  Patient reports that several family members have abnormal thyroid levels.  Frequent urination: Patient reports her symptoms are worse when she drinks sweet tea. Patient reports that she does have a prolapse bladder. Patient reports that she also has some foul odor to her urine.   Review of Systems  Constitutional: Positive for activity change, appetite change and fatigue.  Endocrine: Positive for cold intolerance and heat intolerance.  Genitourinary: Positive for frequency.      Blood pressure 140/86, pulse 60, temperature 97.6 F (36.4 C), temperature source Oral, resp. rate 16, SpO2 97 %.  Objective:   Physical Exam  Constitutional: She appears well-developed and well-nourished.  HENT:  Head: Normocephalic and atraumatic.  Right Ear: External ear normal.  Left Ear: External ear normal.  Cardiovascular: Normal rate, regular rhythm, normal heart sounds and intact distal pulses.   Pulmonary/Chest: Effort normal and breath sounds normal.      Assessment & Plan:  1. Chronic fatigue Worsening. F/U pending lab report. - Thyroid Panel With TSH - CBC with Differential/Platelet  2. Paresthesia - Vitamin B12  3. Avitaminosis D - VITAMIN D 25 Hydroxy (Vit-D Deficiency, Fractures)  4. Hard of hearing, bilateral  5. Frequent urination - POCT urinalysis dipstick     Patient seen and examined by Dr. Leo Grosser, and  note scribed by Liz Beach. Dimas, CMA.  I have reviewed the document for accuracy and completeness and I agree with above. Leo Grosser, MD   Lorie Phenix, MD

## 2015-12-14 LAB — CBC WITH DIFFERENTIAL/PLATELET
Basophils Absolute: 0 10*3/uL (ref 0.0–0.2)
Basos: 1 %
EOS (ABSOLUTE): 0 10*3/uL (ref 0.0–0.4)
EOS: 1 %
HEMATOCRIT: 45.2 % (ref 34.0–46.6)
HEMOGLOBIN: 15.3 g/dL (ref 11.1–15.9)
IMMATURE GRANULOCYTES: 0 %
Immature Grans (Abs): 0 10*3/uL (ref 0.0–0.1)
LYMPHS ABS: 1.1 10*3/uL (ref 0.7–3.1)
Lymphs: 32 %
MCH: 32.6 pg (ref 26.6–33.0)
MCHC: 33.8 g/dL (ref 31.5–35.7)
MCV: 96 fL (ref 79–97)
MONOCYTES: 7 %
Monocytes Absolute: 0.3 10*3/uL (ref 0.1–0.9)
NEUTROS PCT: 59 %
Neutrophils Absolute: 2.1 10*3/uL (ref 1.4–7.0)
Platelets: 160 10*3/uL (ref 150–379)
RBC: 4.69 x10E6/uL (ref 3.77–5.28)
RDW: 13.9 % (ref 12.3–15.4)
WBC: 3.6 10*3/uL (ref 3.4–10.8)

## 2015-12-14 LAB — THYROID PANEL WITH TSH
Free Thyroxine Index: 2.5 (ref 1.2–4.9)
T3 Uptake Ratio: 38 % (ref 24–39)
T4, Total: 6.5 ug/dL (ref 4.5–12.0)
TSH: 1.36 u[IU]/mL (ref 0.450–4.500)

## 2015-12-14 LAB — VITAMIN D 25 HYDROXY (VIT D DEFICIENCY, FRACTURES): Vit D, 25-Hydroxy: 18.7 ng/mL — ABNORMAL LOW (ref 30.0–100.0)

## 2015-12-14 LAB — VITAMIN B12: Vitamin B-12: 580 pg/mL (ref 211–946)

## 2015-12-16 ENCOUNTER — Telehealth: Payer: Self-pay

## 2015-12-16 NOTE — Telephone Encounter (Signed)
-----   Message from Lorie Phenix, MD sent at 12/14/2015  7:40 AM EST ----- All labs can back normal except Vitamin D.  May be cause of some of fatigue. Start Vitamin D 2000 iu daily and see if helps. Thanks.

## 2015-12-16 NOTE — Telephone Encounter (Signed)
Pt advised as directed below.   Thanks,   -Kari Montero  

## 2016-02-26 ENCOUNTER — Telehealth: Payer: Self-pay | Admitting: Family Medicine

## 2016-02-26 ENCOUNTER — Other Ambulatory Visit: Payer: Self-pay | Admitting: Family Medicine

## 2016-02-26 DIAGNOSIS — M1990 Unspecified osteoarthritis, unspecified site: Secondary | ICD-10-CM

## 2016-02-26 MED ORDER — OXYCODONE HCL 10 MG PO TABS: ORAL_TABLET | ORAL | Status: DC

## 2016-02-26 NOTE — Telephone Encounter (Signed)
Called but no answer-aa

## 2016-02-26 NOTE — Telephone Encounter (Signed)
Printed out 3 months. Recommend follow up with Dr. Sherrie MustacheFisher in July for refills. Thanks.

## 2016-02-26 NOTE — Telephone Encounter (Signed)
Pt contacted office for refill request on the following medications: Oxycodone HCl 10 MG TABS. Pt would like to pick up 3 hard scripts so it will last for 3 months. Pt stated that she isn't sure when it is due but she doesn't want to run out. Pt stated that she doesn't know when she will run out of the medication. Thanks TNP

## 2016-02-27 NOTE — Telephone Encounter (Signed)
Pt informed. Angela Young, CMA  

## 2016-05-22 ENCOUNTER — Other Ambulatory Visit: Payer: Self-pay | Admitting: Family Medicine

## 2016-05-22 ENCOUNTER — Ambulatory Visit: Payer: Medicare Other | Admitting: Family Medicine

## 2016-05-22 ENCOUNTER — Other Ambulatory Visit: Payer: Self-pay

## 2016-05-22 DIAGNOSIS — M1990 Unspecified osteoarthritis, unspecified site: Secondary | ICD-10-CM

## 2016-05-22 MED ORDER — PREDNISONE 10 MG PO TABS: ORAL_TABLET | ORAL | Status: DC

## 2016-05-22 MED ORDER — OXYCODONE HCL 10 MG PO TABS: ORAL_TABLET | ORAL | Status: DC

## 2016-05-22 NOTE — Telephone Encounter (Signed)
Pt reports she is going to the beach the first weekend in August.  She is going to need a refill before she goes.  I advised her she would need a follow up visit to establish with Dr. Sherrie MustacheFisher she agreed but it looks like is schedule booked.  Please advise.  Thanks,   -Vernona RiegerLaura

## 2016-06-15 ENCOUNTER — Ambulatory Visit (INDEPENDENT_AMBULATORY_CARE_PROVIDER_SITE_OTHER): Payer: Medicare Other | Admitting: Family Medicine

## 2016-06-15 ENCOUNTER — Encounter: Payer: Self-pay | Admitting: Family Medicine

## 2016-06-15 VITALS — BP 128/78 | HR 74 | Temp 97.4°F | Resp 16 | Ht 61.75 in | Wt 134.0 lb

## 2016-06-15 DIAGNOSIS — D8989 Other specified disorders involving the immune mechanism, not elsewhere classified: Secondary | ICD-10-CM

## 2016-06-15 DIAGNOSIS — G9332 Myalgic encephalomyelitis/chronic fatigue syndrome: Secondary | ICD-10-CM

## 2016-06-15 DIAGNOSIS — M1990 Unspecified osteoarthritis, unspecified site: Secondary | ICD-10-CM | POA: Diagnosis not present

## 2016-06-15 DIAGNOSIS — E559 Vitamin D deficiency, unspecified: Secondary | ICD-10-CM | POA: Diagnosis not present

## 2016-06-15 DIAGNOSIS — R5382 Chronic fatigue, unspecified: Secondary | ICD-10-CM | POA: Diagnosis not present

## 2016-06-15 MED ORDER — OXYCODONE HCL 10 MG PO TABS: ORAL_TABLET | ORAL | 0 refills | Status: DC

## 2016-06-15 NOTE — Progress Notes (Signed)
       Patient: Angela DexterGlenda M Young Female    DOB: July 13, 1931   82 y.o.   MRN: [id number] Visit Date: 06/15/2016  Today's Provider: Mila Merryonald Fisher, MD   Chief Complaint  Patient presents with  . Follow-up  . Depression  . Osteoarthritis   Subjective:    HPI  Clinical depression: From 09/09/2015-started mirtazapine (REMERON) 15 MG Take 1 tablet (15 mg total) by mouth at bedtime.   Chronic osteoarthritis: From 09/09/2015-no changes, Continue current medication and plan of care.    Avitaminosis D: From 12/13/2015-started VITAMIN D 2000 units qd.    Allergies  Allergen Reactions  . Ether    No outpatient prescriptions have been marked as taking for the 06/15/16 encounter (Office Visit) with Malva Limesonald E Fisher, MD.    Review of Systems  Constitutional: Negative for appetite change, chills, fatigue and fever.  Respiratory: Negative for chest tightness and shortness of breath.   Cardiovascular: Negative for chest pain and palpitations.  Gastrointestinal: Negative for abdominal pain, nausea and vomiting.  Neurological: Negative for dizziness and weakness.    Social History  Substance Use Topics  . Smoking status: Former Games developermoker  . Smokeless tobacco: Never Used  . Alcohol use No   Objective:   There were no vitals taken for this visit.  Physical Exam      Assessment & Plan:           Mila Merryonald Fisher, MD  [facility]

## 2016-06-15 NOTE — Progress Notes (Signed)
Patient: Angela Young Female    DOB: 1931/10/11   82 y.o.   MRN: [id number] Visit Date: 06/15/2016  Today's Provider: Mila Merryonald Fisher, MD   Chief Complaint  Patient presents with  . Follow-up    Vitamin D deficiency  . Osteoarthritis   Subjective:    HPI   This is a previous patient of Dr. Elease HashimotoMaloney present today as new patient to me to establish care and follow up on chronic medical problems.   Follow up Chronic Osteoarthritis: Patient was last seen for this problem 9 months ago and no changes were made. Patient reports good compliance with treatment, good tolerance and good symptom control.    Follow up Vitamin D Deficiency: Patient was last seen for this problem 6 months ago. Changes made during that visit includes starting Vitamin D 2,000 units daily. Patient comes in today stating she is not taking Vitamin D supplements right now because she feels that she does not need them.  She thinks she may have had a bit more energy when she was taking it.     Allergies  Allergen Reactions  . Ether    Current Meds  Medication Sig  . caffeine 200 MG TABS tablet Take 200 mg by mouth every 4 (four) hours as needed.  . magnesium 30 MG tablet Take 30 mg by mouth 2 (two) times daily.  . Multiple Vitamins-Minerals (ENERGY BOOSTER) PACK Take by mouth.  . Oxycodone HCl 10 MG TABS 1 tablet four times daily    Review of Systems  Constitutional: Positive for fatigue. Negative for appetite change, chills and fever.  Respiratory: Negative for chest tightness and shortness of breath.   Cardiovascular: Negative for chest pain and palpitations.  Gastrointestinal: Negative for abdominal pain, nausea and vomiting.  Musculoskeletal: Positive for arthralgias.  Neurological: Negative for dizziness and weakness.  Psychiatric/Behavioral: Positive for dysphoric mood.    Social History  Substance Use Topics  . Smoking status: Former Games developermoker  . Smokeless tobacco: Never Used  . Alcohol use No     Objective:   BP 128/78 (BP Location: Left Arm, Patient Position: Sitting, Cuff Size: Small)   Pulse 74   Temp 97.4 F (36.3 C) (Oral)   Resp 16   Ht 5' 1.75" (1.568 m)   Wt 134 lb (60.8 kg)   SpO2 96% Comment: room air  BMI 24.71 kg/m   Physical Exam   General Appearance:    Alert, cooperative, no distress  Eyes:    PERRL, conjunctiva/corneas clear, EOM's intact       Lungs:     Clear to auscultation bilaterally, respirations unlabored  Heart:    Regular rate and rhythm  Neurologic:   Awake, alert, oriented x 3. No apparent focal neurological           defect.           Assessment & Plan:     1. Chronic osteoarthritis She feels pain is well controlled current regiment. Refilled x 3 today. She requires quad cane for ambulation, completed Handicap parking forms today.  - Oxycodone HCl 10 MG TABS; 1 tablet four times daily  Dispense: 120 tablet; Refill: 0  2. CFIDS (chronic fatigue and immune dysfunction syndrome) Stable   3. Vitamin D deficiency Is going to start back on supplement vitamin D. Counseled on benfits of BMD for osteoporosis screening, but she declined to have done at this time. Will discuss further at follow up visit.   Follow  up 3 months. Check vit d level at follow up.        Mila Merryonald Fisher, MD  [facility]

## 2016-09-16 ENCOUNTER — Ambulatory Visit: Payer: Medicare Other | Admitting: Family Medicine

## 2016-09-23 ENCOUNTER — Other Ambulatory Visit: Payer: Self-pay | Admitting: Family Medicine

## 2016-09-23 DIAGNOSIS — M1990 Unspecified osteoarthritis, unspecified site: Secondary | ICD-10-CM

## 2016-09-23 NOTE — Telephone Encounter (Signed)
Pt contacted office for refill request on the following medications: Oxycodone HCl 10 MG TABS Pt stated she would like to pick this up the first of the week. Please advise. Thanks TNP

## 2016-09-28 ENCOUNTER — Other Ambulatory Visit: Payer: Self-pay | Admitting: Family Medicine

## 2016-09-28 DIAGNOSIS — M1990 Unspecified osteoarthritis, unspecified site: Secondary | ICD-10-CM

## 2016-09-28 MED ORDER — OXYCODONE HCL 10 MG PO TABS: ORAL_TABLET | ORAL | 0 refills | Status: DC

## 2016-11-09 ENCOUNTER — Ambulatory Visit: Payer: Medicare Other | Admitting: Family Medicine

## 2016-12-23 ENCOUNTER — Other Ambulatory Visit: Payer: Self-pay | Admitting: Family Medicine

## 2016-12-23 DIAGNOSIS — M1990 Unspecified osteoarthritis, unspecified site: Secondary | ICD-10-CM

## 2016-12-23 MED ORDER — OXYCODONE HCL 10 MG PO TABS: ORAL_TABLET | ORAL | 0 refills | Status: DC

## 2016-12-23 NOTE — Telephone Encounter (Signed)
Pt contacted office for refill request on the following medications: Oxycodone HCl 10 MG TABS  Please advise. Thanks TNP

## 2016-12-24 ENCOUNTER — Other Ambulatory Visit: Payer: Self-pay | Admitting: Family Medicine

## 2016-12-24 DIAGNOSIS — M1990 Unspecified osteoarthritis, unspecified site: Secondary | ICD-10-CM

## 2016-12-24 MED ORDER — OXYCODONE HCL 10 MG PO TABS: ORAL_TABLET | ORAL | 0 refills | Status: DC

## 2017-03-02 ENCOUNTER — Ambulatory Visit: Payer: Medicare Other | Admitting: Family Medicine

## 2017-03-03 ENCOUNTER — Encounter: Payer: Medicare Other | Admitting: Family Medicine

## 2017-03-03 ENCOUNTER — Ambulatory Visit: Payer: Medicare Other

## 2017-03-05 ENCOUNTER — Telehealth: Payer: Self-pay | Admitting: Family Medicine

## 2017-03-05 NOTE — Telephone Encounter (Signed)
Called Pt to schedule AWV with [facility] - tah

## 2017-03-26 ENCOUNTER — Other Ambulatory Visit: Payer: Self-pay | Admitting: Family Medicine

## 2017-03-26 DIAGNOSIS — M1990 Unspecified osteoarthritis, unspecified site: Secondary | ICD-10-CM

## 2017-03-26 MED ORDER — OXYCODONE HCL 10 MG PO TABS: ORAL_TABLET | ORAL | 0 refills | Status: DC

## 2017-03-26 NOTE — Telephone Encounter (Signed)
Last fill 12/24/16-aa

## 2017-03-26 NOTE — Telephone Encounter (Signed)
Pt contacted office for refill request on the following medications:  Oxycodone HCl 10 MG TABS.  CB#[phone]/MW

## 2017-03-30 ENCOUNTER — Telehealth: Payer: Self-pay | Admitting: Family Medicine

## 2017-03-30 NOTE — Telephone Encounter (Signed)
Tried calling pt, line was busy. Rx's at front desk.

## 2017-03-30 NOTE — Telephone Encounter (Signed)
Please review. KW 

## 2017-03-30 NOTE — Telephone Encounter (Signed)
These were printed on 03-26-2017

## 2017-03-30 NOTE — Telephone Encounter (Signed)
Pt needs refill   Oxycodone HCl 10 MG TABS   03/26/17 -- Malva LimesFisher, Donald E, MD    1 tablet four times daily     Pt requested 3 month seperate orders.  She needs by tomorrow.  teri

## 2017-03-31 ENCOUNTER — Encounter: Payer: Self-pay | Admitting: Family Medicine

## 2017-04-07 ENCOUNTER — Ambulatory Visit (INDEPENDENT_AMBULATORY_CARE_PROVIDER_SITE_OTHER): Payer: Medicare Other | Admitting: Family Medicine

## 2017-04-07 ENCOUNTER — Encounter: Payer: Self-pay | Admitting: Family Medicine

## 2017-04-07 VITALS — BP 160/90 | HR 82 | Temp 97.4°F | Resp 16 | Ht 61.5 in | Wt 138.0 lb

## 2017-04-07 DIAGNOSIS — Z0000 Encounter for general adult medical examination without abnormal findings: Secondary | ICD-10-CM | POA: Diagnosis not present

## 2017-04-07 DIAGNOSIS — M1990 Unspecified osteoarthritis, unspecified site: Secondary | ICD-10-CM

## 2017-04-07 DIAGNOSIS — E559 Vitamin D deficiency, unspecified: Secondary | ICD-10-CM

## 2017-04-07 DIAGNOSIS — M47819 Spondylosis without myelopathy or radiculopathy, site unspecified: Secondary | ICD-10-CM

## 2017-04-07 DIAGNOSIS — D649 Anemia, unspecified: Secondary | ICD-10-CM

## 2017-04-07 MED ORDER — OXYCODONE HCL 10 MG PO TABS: ORAL_TABLET | ORAL | 0 refills | Status: DC

## 2017-04-07 NOTE — Patient Instructions (Signed)
. .  I recommend that you get the Prevnar-13 vaccine to protect yourself from certain strains of pneumonia. Please call our office at [phone] at your earliest convenience to schedule this vaccine.    

## 2017-04-07 NOTE — Progress Notes (Signed)
Patient: Angela Young, Female    DOB: [birth year], 83 y.o.   MRN: [id number] Visit Date: 04/07/2017  Today's Provider: Mila Merry, MD   Chief Complaint  Patient presents with  . Medicare Wellness  . Osteoarthritis  . Depression   Subjective:    Annual wellness visit Angela Young is a 83 y.o. female. She feels fairly well. She reports exercising daily. She reports she is sleeping poorly.  ----------------------------------------------------------- Follow up of Chronic Osteoarthritis:  Patient was last seen for this problem 10 months ago and no changes were made.   Follow up of Chronic Fatigue syndrome:  Patient was last seen for this problem 10 months ago and no changes were made.   Follow up of Depression:  Patient was last seen for this problem more than 1 year ago by Dr. Elease Hashimoto. Management during that visit includes starting Mirtazepine.   Follow up of Vitamin D Deficiency:  Patient was last seen for this problem 10 months ago. Management during that visit includes starting Vitamin D supplements.   Review of Systems  Constitutional: Positive for fatigue. Negative for appetite change, chills and fever.  HENT: Positive for congestion, dental problem and hearing loss.   Respiratory: Negative for chest tightness and shortness of breath.   Cardiovascular: Negative for chest pain and palpitations.  Gastrointestinal: Positive for constipation. Negative for abdominal pain, nausea and vomiting.  Endocrine: Positive for cold intolerance.  Musculoskeletal: Positive for arthralgias, gait problem and myalgias.  Allergic/Immunologic: Positive for environmental allergies and food allergies.  Neurological: Negative for dizziness and weakness.  Psychiatric/Behavioral: Positive for agitation.    Social History   Social History  . Marital status: Divorced    Spouse name: N/A  . Number of children: N/A  . Years of education: N/A   Occupational History  . Not on file.    Social History Main Topics  . Smoking status: Former Games developer  . Smokeless tobacco: Never Used  . Alcohol use No  . Drug use: No  . Sexual activity: Not on file   Other Topics Concern  . Not on file   Social History Narrative  . No narrative on file    Past Medical History:  Diagnosis Date  . Depression      Patient Active Problem List   Diagnosis Date Noted  . Paresthesia 12/13/2015  . Abnormal ECG 09/09/2015  . Allergic rhinitis 09/09/2015  . Benign cyst of breast 09/09/2015  . Body mass index (BMI) of 26.0-26.9 in adult 09/09/2015  . CFIDS (chronic fatigue and immune dysfunction syndrome) (HCC) 09/09/2015  . Clinical depression 09/09/2015  . Bladder cystocele 09/09/2015  . Acid reflux 09/09/2015  . 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 Polycythemia 09/09/2015  . Vitamin D deficiency 09/09/2015  . Dermatitis 09/09/2015  . Chronic osteoarthritis 04/30/2015  . Spondylosis without myelopathy 05/11/2006    Past Surgical History:  Procedure Laterality Date  . ABDOMINAL HYSTERECTOMY    . BREAST LUMPECTOMY    . EYE SURGERY     due to trauma  . HIP SURGERY Right 05/13/11    total hip replacement dr Despina Hick; left 07/11/08 Dr Ernest Pine  . KNEE SURGERY Left 10/10  . resection of right carotid tumor     benign per pt    Her family history includes Anemia in her mother; Arthritis in her brother, mother, and sister; Heart disease in her sister; Stroke in her mother.      Current Outpatient Prescriptions:  .  caffeine  200 MG TABS tablet, Take 200 mg by mouth every 4 (four) hours as needed., Disp: , Rfl:  .  magnesium 30 MG tablet, Take 30 mg by mouth 2 (two) times daily., Disp: , Rfl:  .  Multiple Vitamins-Minerals (ENERGY BOOSTER) PACK, Take by mouth., Disp: , Rfl:  .  Oxycodone HCl 10 MG TABS, 1 tablet four times daily, Disp: 120 tablet, Rfl: 0 .  Cholecalciferol (VITAMIN D) 2000 UNITS CAPS, Take by mouth., Disp: , Rfl:   Patient Care Team: Malva LimesFisher, Rekisha Welling E, MD as PCP -  General (Family Medicine)     Objective:   Vitals: BP (!) 160/90 (BP Location: Left Arm, Patient Position: Sitting, Cuff Size: Normal)   Pulse 82   Temp 97.4 F (36.3 C) (Oral)   Resp 16   Ht 5' 1.5" (1.562 m)   Wt 138 lb (62.6 kg)   SpO2 95% Comment: room air  BMI 25.65 kg/m   Physical Exam  Activities of Daily Living In your present state of health, do you have any difficulty performing the following activities: 04/07/2017  Hearing? Y  Vision? N  Difficulty concentrating or making decisions? N  Walking or climbing stairs? Y  Dressing or bathing? N  Doing errands, shopping? N  Some recent data might be hidden    Fall Risk Assessment Fall Risk  04/07/2017 09/09/2015  Falls in the past year? No No     Depression Screen PHQ 2/9 Scores 04/07/2017 09/09/2015  PHQ - 2 Score 0 4  PHQ- 9 Score 3 9    Cognitive Testing - 6-CIT  Correct? Score   What year is it? yes 0 0 or 4  What month is it? yes 0 0 or 3  Memorize:    Floyde ParkinsJohn,  [place],  44,  [address],  [place],      What time is it? (within 1 hour) yes 0 0 or 3  Count backwards from 20 yes 0 0, 2, or 4  Name the months of the year yes 0 0, 2, or 4  Repeat name & address above yes 0 0, 2, 4, 6, 8, or 10       TOTAL SCORE  0/28   Interpretation:  Normal  Normal (0-7) Abnormal (8-28)    Audit-C Alcohol Use Screening  Question Answer Points  How often do you have alcoholic drink? never 0  On days you do drink alcohol, how many drinks do you typically consume? n/a 0  How oftey will you drink 6 or more in a total? never 0  Total Score:  0   A score of 3 or more in women, and 4 or more in men indicates increased risk for alcohol abuse, EXCEPT if all of the points are from question 1.   Current Exercise Habits: Home exercise routine, Type of exercise: Other - see comments (yardwork), Time (Minutes): 60, Frequency (Times/Week): 6, Weekly Exercise (Minutes/Week): 360, Intensity: Mild Exercise limited by: Other - see  comments   Assessment & Plan:     Annual Wellness Visit  Reviewed patient's Family Medical History Reviewed and updated list of patient's medical providers Assessment of cognitive impairment was done Assessed patient's functional ability Established a written schedule for health screening services Health Risk Assessent Completed and Reviewed  Exercise Activities and Dietary recommendations Goals    None       There is no immunization history on file for this patient.  Health Maintenance  Topic Date Due  . DEXA SCAN  08/24/1998  . PNA vac Low Risk Adult (2 of 2 - PPSV23) 09/20/2015  . INFLUENZA VACCINE  06/02/2017  . TETANUS/TDAP  09/19/2024     Discussed health benefits of physical activity, and encouraged her to engage in regular exercise appropriate for her age and condition.    ------------------------------------------------------------------------------------------------------------ 1. Annual wellness vist She refuses BMD, refuses breast exam, Refuses mammogram Refuses pneumovax.   2. Anemia, unspecified type  - CBC - Renal function panel - Folate - Ferritin  3. Chronic osteoarthritis She thinks her joint disease are related  - Oxycodone HCl 10 MG TABS; One tablet every 4-6 hours, up to 5 tablets a day  Dispense: 150 tablet; Refill: 0  4. Spondylosis without myelopathy Oxycodone is effective, but wearing off over night, she requests to add a fifth dose of oxycodone so she can one before she goes to bed at night.   5. Vitamin D deficiency Not currently taking vitamin D supplement.  - VITAMIN D 25 Hydroxy (Vit-D Deficiency, Fractures)  6. Rash She thinks she is having allergy reaction to skin products and is interested in seeing dermtologist. She will call back if she decides to have this done.  \    Mila Merry, MD  [facility]

## 2017-04-08 LAB — FOLATE

## 2017-04-08 LAB — RENAL FUNCTION PANEL
ALBUMIN: 4.3 g/dL (ref 3.5–4.7)
BUN/Creatinine Ratio: 18 (ref 12–28)
BUN: 16 mg/dL (ref 8–27)
CHLORIDE: 103 mmol/L (ref 96–106)
CO2: 24 mmol/L (ref 18–29)
Calcium: 9.1 mg/dL (ref 8.7–10.3)
Creatinine, Ser: 0.88 mg/dL (ref 0.57–1.00)
GFR calc Af Amer: 70 mL/min/{1.73_m2} (ref >59)
GFR calc non Af Amer: 61 mL/min/{1.73_m2} (ref >59)
GLUCOSE: 87 mg/dL (ref 65–99)
PHOSPHORUS: 3.6 mg/dL (ref 2.5–4.5)
POTASSIUM: 4.3 mmol/L (ref 3.5–5.2)
Sodium: 141 mmol/L (ref 134–144)

## 2017-04-08 LAB — CBC
HEMATOCRIT: 45.7 % (ref 34.0–46.6)
HEMOGLOBIN: 14.7 g/dL (ref 11.1–15.9)
MCH: 32.2 pg (ref 26.6–33.0)
MCHC: 32.2 g/dL (ref 31.5–35.7)
MCV: 100 fL — ABNORMAL HIGH (ref 79–97)
Platelets: 163 10*3/uL (ref 150–379)
RBC: 4.56 x10E6/uL (ref 3.77–5.28)
RDW: 14.3 % (ref 12.3–15.4)
WBC: 5.1 10*3/uL (ref 3.4–10.8)

## 2017-04-08 LAB — VITAMIN D 25 HYDROXY (VIT D DEFICIENCY, FRACTURES): VIT D 25 HYDROXY: 23.2 ng/mL — AB (ref 30.0–100.0)

## 2017-04-08 LAB — FERRITIN: FERRITIN: 41 ng/mL (ref 15–150)

## 2017-07-19 ENCOUNTER — Other Ambulatory Visit: Payer: Self-pay

## 2017-07-19 DIAGNOSIS — M1990 Unspecified osteoarthritis, unspecified site: Secondary | ICD-10-CM

## 2017-07-20 MED ORDER — OXYCODONE HCL 10 MG PO TABS: ORAL_TABLET | ORAL | 0 refills | Status: DC

## 2017-07-20 NOTE — Addendum Note (Signed)
Addended by: Malva Limes on: 07/20/2017 09:05 AM   Modules accepted: Orders

## 2017-08-19 ENCOUNTER — Other Ambulatory Visit: Payer: Self-pay | Admitting: Physician Assistant

## 2017-08-19 DIAGNOSIS — M1990 Unspecified osteoarthritis, unspecified site: Secondary | ICD-10-CM

## 2017-08-19 MED ORDER — OXYCODONE HCL 10 MG PO TABS: ORAL_TABLET | ORAL | 0 refills | Status: DC

## 2017-08-19 NOTE — Progress Notes (Signed)
Patient came in and was having [facility] [address]. She is [facility] for future refills. Dr. Sherrie MustacheFisher has already left for the day (his admin day) thus I will give her one Rx for October (as I only personally refill one month at a time) and she can get new Rx in Nov from Dr. Sherrie MustacheFisher for the 3 months as she has been doing.   NCCSR was reviewed. There have been no early fills or fills from other offices. She has always previously received from [facility] [address] until as stated above. There was no Rx noted for fill in September but she did say she received one.   Dr. Sherrie MustacheFisher I am routing to you as an FYI.

## 2017-09-28 ENCOUNTER — Telehealth: Payer: Self-pay | Admitting: Family Medicine

## 2017-09-28 NOTE — Telephone Encounter (Signed)
Error/MW °

## 2017-09-29 ENCOUNTER — Ambulatory Visit (INDEPENDENT_AMBULATORY_CARE_PROVIDER_SITE_OTHER): Payer: Medicare Other | Admitting: Family Medicine

## 2017-09-29 ENCOUNTER — Encounter: Payer: Self-pay | Admitting: Family Medicine

## 2017-09-29 DIAGNOSIS — M19041 Primary osteoarthritis, right hand: Secondary | ICD-10-CM

## 2017-09-29 DIAGNOSIS — M19042 Primary osteoarthritis, left hand: Secondary | ICD-10-CM

## 2017-09-29 MED ORDER — TRAMADOL HCL 50 MG PO TABS: 50.0000 mg | ORAL_TABLET | Freq: Three times a day (TID) | ORAL | 3 refills | Status: DC | PRN

## 2017-09-29 NOTE — Progress Notes (Signed)
       Patient: Angela Young Female    DOB: 08-02-1931   83 y.o.   MRN: [id number] Visit Date: 09/29/2017  Today's Provider: Mila Merryonald Fisher, MD   Chief Complaint  Patient presents with  . Hand Pain   Subjective:    Hand Pain   Incident onset: several months ago. There was no injury mechanism. Pain location: both hands. The quality of the pain is described as aching (and tingling feeling). Associated symptoms include numbness (in hands). Pertinent negatives include no chest pain. Treatments tried: Oxycodone. The treatment provided mild relief.  Patient [facility] but had to switch pharmacies and was given a different manufactured brand of Oxycodone. Patient says that since then she has been having more problems with numbness and pain in her hands. She would like to try taking Tramadol for hand pain.      Allergies  Allergen Reactions  . Ether      Current Outpatient Medications:  .  caffeine 200 MG TABS tablet, Take 200 mg by mouth every 4 (four) hours as needed., Disp: , Rfl:  .  magnesium 30 MG tablet, Take 30 mg by mouth 2 (two) times daily., Disp: , Rfl:  .  Multiple Vitamins-Minerals (ENERGY BOOSTER) PACK, Take by mouth., Disp: , Rfl:  .  Oxycodone HCl 10 MG TABS, One tablet every 4-6 hours, up to 5 tablets a day, Disp: 150 tablet, Rfl: 0 .  Cholecalciferol (VITAMIN D) 2000 UNITS CAPS, Take by mouth., Disp: , Rfl:   Review of Systems  Constitutional: Negative for appetite change, chills, fatigue and fever.  Respiratory: Negative for chest tightness and shortness of breath.   Cardiovascular: Negative for chest pain and palpitations.  Gastrointestinal: Negative for abdominal pain, nausea and vomiting.  Musculoskeletal: Positive for arthralgias (in hands).  Neurological: Positive for numbness (in hands). Negative for dizziness and weakness.    Social History   Tobacco Use  . Smoking status: Former Games developermoker  . Smokeless  tobacco: Never Used  Substance Use Topics  . Alcohol use: No   Objective:   BP (!) 142/80 (BP Location: Left Arm, Patient Position: Sitting, Cuff Size: Large)   Pulse 78   Temp 97.6 F (36.4 C) (Oral)   Resp 16   Wt 136 lb (61.7 kg)   SpO2 95% Comment: room air  BMI 25.28 kg/m  There were no vitals filed for this visit.   Physical Exam  General appearance: alert, well developed, well nourished, cooperative and in no distress Head: Normocephalic, without obvious abnormality, atraumatic Respiratory: Respirations even and unlabored, normal respiratory rate Extremities: Moderate arthritic changes of both hands. No erythema.      Assessment & Plan:     1. Primary osteoarthritis of both hands Recommend trial of topical NSAID but she doesn't want to try. Agreed prescription to try  traMADol (ULTRAM) 50 MG tablet; Take 1 tablet (50 mg total) by mouth every 8 (eight) hours as needed.  Dispense: 90 tablet; Refill: 3 in place of oxycodone.   Recommended, but patient refuse, flu vaccine.       Mila Merryonald Fisher, MD  [facility]

## 2017-09-29 NOTE — Patient Instructions (Addendum)
You may want to try diclofenac (Voltaren) topical to help with arthritis pain if tramadol doesn't help   Diclofenac sodium topical solution What is this medicine? DICLOFENAC (dye KLOE fen ak) is a non-steroidal anti-inflammatory drug (NSAID). It is used to treat osteoarthritis This medicine may be used for other purposes; ask your health care provider or pharmacist if you have questions. COMMON BRAND NAME(S): INFLAMMA-K, PENNSAID, VOPAC MDS What should I tell my health care provider before I take this medicine? They need to know if you have any of these conditions: -asthma -bleeding problems -coronary artery bypass graft (CABG) surgery within the past 2 weeks -heart disease -high blood pressure -if you frequently drink alcohol containing drinks -kidney disease -liver disease -open or infected skin -stomach problems -an unusual or allergic reaction to diclofenac, aspirin, other NSAIDs, other medicines, foods, dyes, or preservatives -pregnant or trying to get pregnant -breast-feeding How should I use this medicine? This medicine is for external use only. Follow the directions on the prescription label. Wash hands before and after use. Apply to the clean, dry skin of the knee. Rub around front, back, and sides of the knee. Do not apply to open wounds, infections, swelling, or areas of exfoliative dermatitis. Allow medicine to dry before using any other lotion or medicine on the same place. Do not get this medicine in your mouth or eyes. If this medicine gets in your eye, rinse out with plenty of cool tap water. Protect treatment area from sunlight and sun lamps. Use your doses at regular intervals. Do not use it more often than directed. A special MedGuide will be given to you by the pharmacist with each prescription and refill. Be sure to read this information carefully each time. Talk to your pediatrician regarding the use of this medicine in children. Special care may be needed. Overdosage:  If you think you have taken too much of this medicine contact a poison control center or emergency room at once. NOTE: This medicine is only for you. Do not share this medicine with others. What if I miss a dose? If you miss a dose, use it as soon as you can. If it is almost time for your next dose, use only that dose. Do not use double or extra doses. What may interact with this medicine? Do not take this medicine with any of the following medications: -cidofovir -ketorolac -methotrexate This medicine may also interact with the following medications: -aspirin -cyclosporine -lithium -medicines for blood pressure -medicines that treat or prevent blood clots like warfarin, enoxaparin, and dalteparin -NSAIDs, medicines for pain and inflammation, like ibuprofen or naproxen -other products used on the skin -steroid medicines like prednisone or cortisone This list may not describe all possible interactions. Give your health care provider a list of all the medicines, herbs, non-prescription drugs, or dietary supplements you use. Also tell them if you smoke, drink alcohol, or use illegal drugs. Some items may interact with your medicine. What should I watch for while using this medicine? Tell your doctor or healthcare professional if your symptoms do not start to get better or if they get worse.   2018 Elsevier/Gold Standard (2015-11-21 09:11:01)

## 2017-10-01 DIAGNOSIS — M19042 Primary osteoarthritis, left hand: Secondary | ICD-10-CM

## 2017-10-01 DIAGNOSIS — M19041 Primary osteoarthritis, right hand: Secondary | ICD-10-CM | POA: Insufficient documentation

## 2017-10-19 ENCOUNTER — Other Ambulatory Visit: Payer: Self-pay | Admitting: Family Medicine

## 2017-10-19 DIAGNOSIS — M1990 Unspecified osteoarthritis, unspecified site: Secondary | ICD-10-CM

## 2017-10-19 MED ORDER — OXYCODONE HCL 10 MG PO TABS: ORAL_TABLET | ORAL | 0 refills | Status: DC

## 2017-10-19 NOTE — Telephone Encounter (Signed)
Please advise [facility]

## 2017-10-19 NOTE — Telephone Encounter (Signed)
Pt contacted office for refill request on the following medications:  Oxycodone HCl 10 MG TABS  [facility]  Pt stated that traMADol (ULTRAM) 50 MG tablet didn't help and she would like to go back to the Oxycodone. Pt stated that she wanted to gi ahead and request the medication so she won't run out over Christmas. Please advise. Thanks TNP

## 2017-10-19 NOTE — Telephone Encounter (Signed)
Please advise 

## 2017-10-19 NOTE — Telephone Encounter (Signed)
Pt advised.   Thanks,   -Laura  

## 2017-10-22 ENCOUNTER — Other Ambulatory Visit: Payer: Self-pay | Admitting: Family Medicine

## 2017-10-22 DIAGNOSIS — M1990 Unspecified osteoarthritis, unspecified site: Secondary | ICD-10-CM

## 2017-10-22 MED ORDER — OXYCODONE HCL 10 MG PO TABS: ORAL_TABLET | ORAL | 0 refills | Status: DC

## 2017-11-19 ENCOUNTER — Other Ambulatory Visit: Payer: Self-pay | Admitting: Family Medicine

## 2017-11-19 DIAGNOSIS — M1990 Unspecified osteoarthritis, unspecified site: Secondary | ICD-10-CM

## 2017-11-19 NOTE — Telephone Encounter (Signed)
Please review. Thanks!  

## 2017-11-19 NOTE — Telephone Encounter (Signed)
Patient is requesting a refill on the following medication  Oxycodone HCl 10 MG TABS   [facility]

## 2017-11-21 MED ORDER — OXYCODONE HCL 10 MG PO TABS: ORAL_TABLET | ORAL | 0 refills | Status: DC

## 2017-12-21 ENCOUNTER — Other Ambulatory Visit: Payer: Self-pay

## 2017-12-21 DIAGNOSIS — M1990 Unspecified osteoarthritis, unspecified site: Secondary | ICD-10-CM

## 2017-12-21 MED ORDER — OXYCODONE HCL 10 MG PO TABS: ORAL_TABLET | ORAL | 0 refills | Status: DC

## 2017-12-21 NOTE — Telephone Encounter (Signed)
Patient called office requesting refill on Oxycodone 10mg  patient states that she would like [facility] [address], I advised patient that she would have to pick up script, patient would like a call back when script is available for pick up. KW

## 2017-12-21 NOTE — Telephone Encounter (Signed)
Please advise [facility]

## 2017-12-22 NOTE — Telephone Encounter (Signed)
Tried calling patient and her VMbox was full. Unable to leave a message.

## 2018-01-21 ENCOUNTER — Other Ambulatory Visit: Payer: Self-pay | Admitting: Family Medicine

## 2018-01-21 DIAGNOSIS — M1990 Unspecified osteoarthritis, unspecified site: Secondary | ICD-10-CM

## 2018-01-21 MED ORDER — OXYCODONE HCL 10 MG PO TABS: ORAL_TABLET | ORAL | 0 refills | Status: DC

## 2018-01-21 NOTE — Telephone Encounter (Signed)
Patient is requesting a refill on the following medication  Oxycodone HCl 10 MG TABS, 150 tablets  [facility] [address].

## 2018-02-16 ENCOUNTER — Other Ambulatory Visit: Payer: Self-pay | Admitting: Family Medicine

## 2018-02-16 DIAGNOSIS — M1990 Unspecified osteoarthritis, unspecified site: Secondary | ICD-10-CM

## 2018-02-16 MED ORDER — OXYCODONE HCL 10 MG PO TABS: ORAL_TABLET | ORAL | 0 refills | Status: DC

## 2018-02-16 NOTE — Telephone Encounter (Signed)
Patient is requesting a refill on the following medication  Oxycodone HCl 10 MG TABS  She uses [facility] [address] [place]

## 2018-03-07 ENCOUNTER — Telehealth: Payer: Self-pay | Admitting: Family Medicine

## 2018-03-07 NOTE — Telephone Encounter (Signed)
Pt is requesting prednisone be called into [facility].She states her osteoarthritis has flared up and she is having a lot of pain in her shoulders.She does not want any generics and would like this delivered

## 2018-03-07 NOTE — Telephone Encounter (Signed)
Please advise 

## 2018-03-08 MED ORDER — PREDNISONE 10 MG PO TABS
ORAL_TABLET | ORAL | 0 refills | Status: DC
Start: 2018-03-08 — End: 2019-02-20

## 2018-03-22 ENCOUNTER — Other Ambulatory Visit: Payer: Self-pay | Admitting: Family Medicine

## 2018-03-22 DIAGNOSIS — M1990 Unspecified osteoarthritis, unspecified site: Secondary | ICD-10-CM

## 2018-03-22 MED ORDER — OXYCODONE HCL 10 MG PO TABS: ORAL_TABLET | ORAL | 0 refills | Status: DC

## 2018-03-22 NOTE — Telephone Encounter (Signed)
Needs refill oxycodone   #150   [facility] [address]

## 2018-04-08 ENCOUNTER — Ambulatory Visit (INDEPENDENT_AMBULATORY_CARE_PROVIDER_SITE_OTHER): Payer: Medicare Other

## 2018-04-08 ENCOUNTER — Ambulatory Visit (INDEPENDENT_AMBULATORY_CARE_PROVIDER_SITE_OTHER): Payer: Medicare Other | Admitting: Family Medicine

## 2018-04-08 VITALS — BP 140/60 | HR 94 | Temp 98.9°F | Ht 62.0 in | Wt 135.2 lb

## 2018-04-08 DIAGNOSIS — M19041 Primary osteoarthritis, right hand: Secondary | ICD-10-CM

## 2018-04-08 DIAGNOSIS — Z23 Encounter for immunization: Secondary | ICD-10-CM

## 2018-04-08 DIAGNOSIS — D649 Anemia, unspecified: Secondary | ICD-10-CM | POA: Diagnosis not present

## 2018-04-08 DIAGNOSIS — Z0000 Encounter for general adult medical examination without abnormal findings: Secondary | ICD-10-CM | POA: Diagnosis not present

## 2018-04-08 DIAGNOSIS — M19042 Primary osteoarthritis, left hand: Secondary | ICD-10-CM | POA: Diagnosis not present

## 2018-04-08 DIAGNOSIS — E559 Vitamin D deficiency, unspecified: Secondary | ICD-10-CM | POA: Diagnosis not present

## 2018-04-08 MED ORDER — DOXYCYCLINE HYCLATE 100 MG PO TABS: 100.0000 mg | ORAL_TABLET | Freq: Two times a day (BID) | ORAL | 0 refills | Status: DC

## 2018-04-08 NOTE — Progress Notes (Signed)
Patient: Angela Young, Female    DOB: 01-Dec-1930, 84 y.o.   MRN: [id number] Visit Date: 04/08/2018  Today's Provider: Mila Merry, MD   Chief Complaint  Patient presents with  . Annual Exam  . Osteoarthritis  . Anemia  . Vitamin D deficiency   Subjective:     Complete Physical Angela Young is a 84 y.o. female. She feels well. She reports exercising not regularly. However, she does stay active. She reports she is sleeping fairly well.   Primary osteoarthritis of both hands From 09/29/2017-changed to  traMADol (ULTRAM) 50 MG tablet. Changes since last ov on 10/19/2017, changed back to oxycodone due to tramadol not helping.  Anemia, unspecified type From 04/07/2017-labs checked, no changes.     Vitamin D deficiency From 04/07/2017-labs checked, no changes. Vitamin d  25-OH was 18.7 and advised to to take 2000 units a day.     Review of Systems  Constitutional: Negative.   HENT: Negative.   Eyes: Negative.   Respiratory: Negative.   Cardiovascular: Negative.   Gastrointestinal: Negative.   Endocrine: Positive for cold intolerance.  Genitourinary: Negative.   Musculoskeletal: Negative.   Skin: Negative.   Allergic/Immunologic: Negative.   Neurological: Negative.   Psychiatric/Behavioral: Negative.     Social History   Socioeconomic History  . Marital status: Divorced    Spouse name: Not on file  . Number of children: 3  . Years of education: Not on file  . Highest education level: 12th grade  Occupational History  . Not on file  Social Needs  . Financial resource strain: Not hard at all  . Food insecurity:    Worry: Never true    Inability: Not on file  . Transportation needs:    Medical: No    Non-medical: No  Tobacco Use  . Smoking status: Former Smoker    Types: Cigarettes  . Smokeless tobacco: Never Used  . Tobacco comment: >15 years ago  Substance and Sexual Activity  . Alcohol use: No  . Drug use: No  . Sexual activity: Not on  file  Lifestyle  . Physical activity:    Days per week: Not on file    Minutes per session: Not on file  . Stress: Rather much  Relationships  . Social connections:    Talks on phone: Not on file    Gets together: Not on file    Attends religious service: Not on file    Active member of club or organization: Not on file    Attends meetings of clubs or organizations: Not on file    Relationship status: Not on file  . Intimate partner violence:    Fear of current or ex partner: Not on file    Emotionally abused: Not on file    Physically abused: Not on file    Forced sexual activity: Not on file  Other Topics Concern  . Not on file  Social History Narrative  . Not on file    Past Medical History:  Diagnosis Date  . Depression      Patient Active Problem List   Diagnosis Date Noted  . Osteoarthritis of both hands 10/01/2017  . Anemia 04/07/2017  . Paresthesia 12/13/2015  . Abnormal ECG 09/09/2015  . Allergic rhinitis 09/09/2015  . Benign cyst of breast 09/09/2015  . Body mass index (BMI) of 26.0-26.9 in adult 09/09/2015  . CFIDS (chronic fatigue and immune dysfunction syndrome) (HCC) 09/09/2015  . Clinical depression 09/09/2015  .  Bladder cystocele 09/09/2015  . Acid reflux 09/09/2015  . 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 History of polycythemia 09/09/2015  . Vitamin D deficiency 09/09/2015  . Dermatitis 09/09/2015  . Chronic osteoarthritis 04/30/2015  . Spondylosis without myelopathy 05/11/2006    Past Surgical History:  Procedure Laterality Date  . ABDOMINAL HYSTERECTOMY    . BREAST LUMPECTOMY    . EYE SURGERY     due to trauma  . HIP SURGERY Right 05/13/11    total hip replacement dr Despina Hick; left 07/11/08 Dr Ernest Pine  . KNEE SURGERY Left 10/10  . resection of right carotid tumor     benign per pt    Her family history includes Anemia in her mother; Arthritis in her brother, mother, and sister; Heart disease in her sister; Stroke in her mother.      Current  Outpatient Medications:  [place] Ginseng 100 MG CAPS, Take 2 capsules by mouth daily., Disp: , Rfl:  .  Oxycodone HCl 10 MG TABS, One tablet every 4-6 hours, up to 5 tablets a day, Disp: 150 tablet, Rfl: 0 .  caffeine 200 MG TABS tablet, Take 200 mg by mouth every 4 (four) hours as needed., Disp: , Rfl:  .  Cholecalciferol (VITAMIN D) 2000 UNITS CAPS, Take by mouth., Disp: , Rfl:  .  magnesium 30 MG tablet, Take 30 mg by mouth 2 (two) times daily., Disp: , Rfl:  .  Multiple Vitamins-Minerals (ENERGY BOOSTER) PACK, Take by mouth., Disp: , Rfl:   Patient Care Team: Malva Limes, MD as PCP - General (Family Medicine)     Objective:   Vitals: BP 140/60 (BP Location: Right Arm)   Pulse 94   Temp 98.9 F (37.2 C) (Oral)   Ht 5\' 2"  (1.575 m)   Wt 135 lb 3.2 oz (61.3 kg)   BMI 24.73 kg/m   Body mass index is 24.73 kg/m.   Physical Exam   General Appearance:    Alert, cooperative, no distress, appears stated age  Head:    Normocephalic, without obvious abnormality, atraumatic  Eyes:    PERRL, conjunctiva/corneas clear, EOM's intact, fundi    benign, both eyes  Ears:    Normal TM's and external ear canals, both ears  Nose:   Nares normal, septum midline, mucosa normal, no drainage    or sinus tenderness  Throat:   Lips, mucosa, and tongue normal; teeth and gums normal  Neck:   Supple, symmetrical, trachea midline, no adenopathy;    thyroid:  no enlargement/tenderness/nodules; no carotid   bruit or JVD  Back:     Symmetric, no curvature, ROM normal, no CVA tenderness  Lungs:     Clear to auscultation bilaterally, respirations unlabored  Chest Wall:    No tenderness or deformity   Heart:    Regular rate and rhythm, S1 and S2 normal, no murmur, rub   or gallop  Breast Exam:    deferred  Abdomen:     Soft, non-tender, bowel sounds active all four quadrants,    no masses, no organomegaly  Pelvic:    deferred  Extremities:   Extremities normal, atraumatic, no cyanosis or edema    Pulses:   2+ and symmetric all extremities  Skin:   Skin color, texture, turgor normal, no rashes or lesions  Lymph nodes:   Cervical, supraclavicular, and axillary nodes normal  Neurologic:   CNII-XII intact, normal strength, sensation and reflexes    throughout    Activities of Daily Living In  your present state of health, do you have any difficulty performing the following activities: 04/08/2018  Hearing? Y  Comment Has hearing loss.   Vision? N  Difficulty concentrating or making decisions? Y  Walking or climbing stairs? Y  Comment Due to balance issues and needing ADs.  Dressing or bathing? N  Doing errands, shopping? N  Preparing Food and eating ? N  Using the Toilet? N  In the past six months, have you accidently leaked urine? Y  Comment Only when drinks caffeine.   Do you have problems with loss of bowel control? N  Managing your Medications? N  Managing your Finances? N  Housekeeping or managing your Housekeeping? N  Some recent data might be hidden    Fall Risk Assessment Fall Risk  04/08/2018 04/07/2017 09/09/2015  Falls in the past year? Yes No No  Number falls in past yr: 1 - -  Follow up Falls prevention discussed - -     Depression Screen PHQ 2/9 Scores 04/08/2018 04/07/2017 09/09/2015  PHQ - 2 Score 6 0 4  PHQ- 9 Score 9 3 9       Assessment & Plan:    Annual Physical Reviewed patient's Family Medical History Reviewed and updated list of patient's medical providers Assessment of cognitive impairment was done Assessed patient's functional ability Established a written schedule for health screening services Health Risk Assessent Completed and Reviewed  Exercise Activities and Dietary recommendations Goals    . DIET - INCREASE WATER INTAKE     Recommend increasing water intake to 4 glasses a day.        There is no immunization history on file for this patient.  Health Maintenance  Topic Date Due  . DEXA SCAN  08/24/1998  . PNA vac Low Risk Adult  (2 of 2 - PPSV23) 09/20/2015  . INFLUENZA VACCINE  06/02/2018  . TETANUS/TDAP  09/19/2024     Discussed health benefits of physical activity, and encouraged her to engage in regular exercise appropriate for her age and condition.    ------------------------------------------------------------------------------------------------------------  1. Annual physical exam Generally doing well. Unremarkable exam for age.  2. Anemia, unspecified type  - CBC - Ferritin  3. Vitamin D deficiency  VITAMIN D 25 Hydroxy (Vit-D Deficiency, Fractures)  4. Primary osteoarthritis of both hands   5. Need for pneumococcal vaccination  - Pneumococcal conjugate vaccine 13-valent   Mila Merryonald Fisher, MD  [facility]

## 2018-04-08 NOTE — Progress Notes (Signed)
Subjective:   Angela Young is a 84 y.o. female who presents for Medicare Annual (Subsequent) preventive examination.  Review of Systems:  N/A Cardiac Risk Factors include: advanced age (>30men, >25 women)     Objective:     Vitals: BP 140/60 (BP Location: Right Arm)   Pulse 94   Temp 98.9 F (37.2 C) (Oral)   Ht 5\' 2"  (1.575 m)   Wt 135 lb 3.2 oz (61.3 kg)   BMI 24.73 kg/m   Body mass index is 24.73 kg/m.  Advanced Directives 04/08/2018 09/09/2015  Does Patient Have a Medical Advance Directive? No No  Would patient like information on creating a medical advance directive? No - Patient declined -    Tobacco Social History   Tobacco Use  Smoking Status Former Smoker  . Types: Cigarettes  Smokeless Tobacco Never Used  Tobacco Comment   >15 years ago     Counseling given: Not Answered Comment: >15 years ago   Clinical Intake:  Pre-visit preparation completed: Yes  Pain : No/denies pain Pain Score: 0-No pain     Nutritional Status: BMI of 19-24  Normal Nutritional Risks: None Diabetes: No  How often do you need to have someone help you when you read instructions, pamphlets, or other written materials from your doctor or pharmacy?: 1 - Never  Interpreter Needed?: No  Information entered by :: [facility], LPN  Past Medical History:  Diagnosis Date  . Depression    Past Surgical History:  Procedure Laterality Date  . ABDOMINAL HYSTERECTOMY    . BREAST LUMPECTOMY    . EYE SURGERY     due to trauma  . HIP SURGERY Right 05/13/11    total hip replacement dr Despina Hick; left 07/11/08 Dr Ernest Pine  . KNEE SURGERY Left 10/10  . resection of right carotid tumor     benign per pt   Family History  Problem Relation Age of Onset  . Arthritis Mother   . Anemia Mother   . Stroke Mother   . Arthritis Sister   . Heart disease Sister   . Arthritis Brother    Social History   Socioeconomic History  . Marital status: Divorced    Spouse name: Not on file  .  Number of children: 3  . Years of education: Not on file  . Highest education level: 12th grade  Occupational History  . Not on file  Social Needs  . Financial resource strain: Not hard at all  . Food insecurity:    Worry: Never true    Inability: Not on file  . Transportation needs:    Medical: No    Non-medical: No  Tobacco Use  . Smoking status: Former Smoker    Types: Cigarettes  . Smokeless tobacco: Never Used  . Tobacco comment: >15 years ago  Substance and Sexual Activity  . Alcohol use: No  . Drug use: No  . Sexual activity: Not on file  Lifestyle  . Physical activity:    Days per week: Not on file    Minutes per session: Not on file  . Stress: Rather much  Relationships  . Social connections:    Talks on phone: Not on file    Gets together: Not on file    Attends religious service: Not on file    Active member of club or organization: Not on file    Attends meetings of clubs or organizations: Not on file    Relationship status: Not on file  Other  Topics Concern  . Not on file  Social History Narrative  . Not on file    Outpatient Encounter Medications as of 04/08/2018  Medication Sig  . [place] Ginseng 100 MG CAPS Take 2 capsules by mouth daily.  . Oxycodone HCl 10 MG TABS One tablet every 4-6 hours, up to 5 tablets a day  . caffeine 200 MG TABS tablet Take 200 mg by mouth every 4 (four) hours as needed.  . Cholecalciferol (VITAMIN D) 2000 UNITS CAPS Take by mouth.  . magnesium 30 MG tablet Take 30 mg by mouth 2 (two) times daily.  . Multiple Vitamins-Minerals (ENERGY BOOSTER) PACK Take by mouth.   No facility-administered encounter medications on file as of 04/08/2018.     Activities of Daily Living In your present state of health, do you have any difficulty performing the following activities: 04/08/2018  Hearing? Y  Comment Has hearing loss.   Vision? N  Difficulty concentrating or making decisions? Y  Walking or climbing stairs? Y  Comment Due to  balance issues and needing ADs.  Dressing or bathing? N  Doing errands, shopping? N  Preparing Food and eating ? N  Using the Toilet? N  In the past six months, have you accidently leaked urine? Y  Comment Only when drinks caffeine.   Do you have problems with loss of bowel control? N  Managing your Medications? N  Managing your Finances? N  Housekeeping or managing your Housekeeping? N  Some recent data might be hidden    Patient Care Team: Malva Limes, MD as PCP - General (Family Medicine)    Assessment:   This is a routine wellness examination for Angela Young.  Exercise Activities and Dietary recommendations Current Exercise Habits: The patient does not participate in regular exercise at present, Exercise limited by: orthopedic condition(s)  Goals    . DIET - INCREASE WATER INTAKE     Recommend increasing water intake to 4 glasses a day.       Fall Risk Fall Risk  04/08/2018 04/07/2017 09/09/2015  Falls in the past year? Yes No No  Number falls in past yr: 1 - -  Follow up Falls prevention discussed - -   Is the patient's home free of loose throw rugs in walkways, pet beds, electrical cords, etc?   yes      Grab bars in the bathroom? no      Handrails on the stairs?   yes      Adequate lighting?   yes  Timed Get Up and Go performed: N/A  Depression Screen PHQ 2/9 Scores 04/08/2018 04/07/2017 09/09/2015  PHQ - 2 Score 6 0 4  PHQ- 9 Score 9 3 9      Cognitive Function     6CIT Screen 04/08/2018  What Year? 0 points  What month? 0 points  What time? 0 points  Count back from 20 0 points  Months in reverse 0 points  Repeat phrase 0 points  Total Score 0     There is no immunization history on file for this patient.  Qualifies for Shingles Vaccine? Due for Shingles vaccine. Declined my offer to administer today. Education has been provided regarding the importance of this vaccine. Pt has been advised to call her insurance company to determine her out of pocket  expense. Advised she may also receive this vaccine at her local pharmacy or Health Dept. Verbalized acceptance and understanding.  Screening Tests Health Maintenance  Topic Date Due  . DEXA SCAN  08/24/1998  . PNA vac Low Risk Adult (2 of 2 - PPSV23) 09/20/2015  . INFLUENZA VACCINE  06/02/2018  . TETANUS/TDAP  09/19/2024    Cancer Screenings: Lung: Low Dose CT Chest recommended if Age 32-80 years, 30 pack-year currently smoking OR have quit w/in 15years. Patient does not qualify. Breast:  Up to date on Mammogram? Yes   Up to date of Bone Density/Dexa? No, pt declines order today.  Colorectal: N/A  Additional Screenings:  Hepatitis C Screening: N/A     Plan:  I have personally reviewed and addressed the Medicare Annual Wellness questionnaire and have noted the following in the patient's chart:  A. Medical and social history B. Use of alcohol, tobacco or illicit drugs  C. Current medications and supplements D. Functional ability and status E.  Nutritional status F.  Physical activity G. Advance directives H. List of other physicians I.  Hospitalizations, surgeries, and ER visits in previous 12 months J.  Vitals K. Screenings such as hearing and vision if needed, cognitive and depression L. Referrals and appointments - none  In addition, I have reviewed and discussed with patient certain preventive protocols, quality metrics, and best practice recommendations. A written personalized care plan for preventive services as well as general preventive health recommendations were provided to patient.  See attached scanned questionnaire for additional information.   Signed,  Hyacinth MeekerMckenzie Gaylord Seydel, LPN Nurse Health Advisor   Nurse Recommendations: Pt declined the DEXA scan order and the Pneumovax 23 vaccine today.

## 2018-04-08 NOTE — Patient Instructions (Signed)
[name] , Thank you for taking time to come for your Medicare Wellness Visit. I appreciate your ongoing commitment to your health goals. Please review the following plan we discussed and let me know if I can assist you in the future.   Screening recommendations/referrals: Colonoscopy: N/A Mammogram: N/A Bone Density: Pt declines today.  Recommended yearly ophthalmology/optometry visit for glaucoma screening and checkup Recommended yearly dental visit for hygiene and checkup  Vaccinations: Influenza vaccine: N/A Pneumococcal vaccine: Up to date on Prevnar 13, pt declined the Pneumovax 23 vaccine today.  Tdap vaccine: Up to date Shingles vaccine: Pt declines today.     Advanced directives: Advance directive discussed with you today. Even though you declined this today please call our office should you change your mind and we can give you the proper paperwork for you to fill out.  Conditions/risks identified: Fall risk prevention ; recommend increasing water intake to 4 glasses a day.  Next appointment: 1:40 PM today wit Dr Sherrie MustacheFisher.   Preventive Care 2565 Years and Older, Female Preventive care refers to lifestyle choices and visits with your health care provider that can promote health and wellness. What does preventive care include?  A yearly physical exam. This is also called an annual well check.  Dental exams once or twice a year.  Routine eye exams. Ask your health care provider how often you should have your eyes checked.  Personal lifestyle choices, including:  Daily care of your teeth and gums.  Regular physical activity.  Eating a healthy diet.  Avoiding tobacco and drug use.  Limiting alcohol use.  Practicing safe sex.  Taking low-dose aspirin every day.  Taking vitamin and mineral supplements as recommended by your health care provider. What happens during an annual well check? The services and screenings done by your health care provider during your annual  well check will depend on your age, overall health, lifestyle risk factors, and family history of disease. Counseling  Your health care provider may ask you questions about your:  Alcohol use.  Tobacco use.  Drug use.  Emotional well-being.  Home and relationship well-being.  Sexual activity.  Eating habits.  History of falls.  Memory and ability to understand (cognition).  Work and work Astronomerenvironment.  Reproductive health. Screening  You may have the following tests or measurements:  Height, weight, and BMI.  Blood pressure.  Lipid and cholesterol levels. These may be checked every 5 years, or more frequently if you are over 82 years old.  Skin check.  Lung cancer screening. You may have this screening every year starting at age 82 if you have a 30-pack-year history of smoking and currently smoke or have quit within the past 15 years.  Fecal occult blood test (FOBT) of the stool. You may have this test every year starting at age 82.  Flexible sigmoidoscopy or colonoscopy. You may have a sigmoidoscopy every 5 years or a colonoscopy every 10 years starting at age 82.  Hepatitis C blood test.  Hepatitis B blood test.  Sexually transmitted disease (STD) testing.  Diabetes screening. This is done by checking your blood sugar (glucose) after you have not eaten for a while (fasting). You may have this done every 1-3 years.  Bone density scan. This is done to screen for osteoporosis. You may have this done starting at age 465.  Mammogram. This may be done every 1-2 years. Talk to your health care provider about how often you should have regular mammograms. Talk with your health  care provider about your test results, treatment options, and if necessary, the need for more tests. Vaccines  Your health care provider may recommend certain vaccines, such as:  Influenza vaccine. This is recommended every year.  Tetanus, diphtheria, and acellular pertussis (Tdap, Td) vaccine.  You may need a Td booster every 10 years.  Zoster vaccine. You may need this after age 36.  Pneumococcal 13-valent conjugate (PCV13) vaccine. One dose is recommended after age 61.  Pneumococcal polysaccharide (PPSV23) vaccine. One dose is recommended after age 1. Talk to your health care provider about which screenings and vaccines you need and how often you need them. This information is not intended to replace advice given to you by your health care provider. Make sure you discuss any questions you have with your health care provider. Document Released: 11/15/2015 Document Revised: 07/08/2016 Document Reviewed: 08/20/2015 Elsevier Interactive Patient Education  2017 [place] Prevention in the Home Falls can cause injuries. They can happen to people of all ages. There are many things you can do to make your home safe and to help prevent falls. What can I do on the outside of my home?  Regularly fix the edges of walkways and driveways and fix any cracks.  Remove anything that might make you trip as you walk through a door, such as a raised step or threshold.  Trim any bushes or trees on the path to your home.  Use bright outdoor lighting.  Clear any walking paths of anything that might make someone trip, such as rocks or tools.  Regularly check to see if handrails are loose or broken. Make sure that both sides of any steps have handrails.  Any raised decks and porches should have guardrails on the edges.  Have any leaves, snow, or ice cleared regularly.  Use sand or salt on walking paths during winter.  Clean up any spills in your garage right away. This includes oil or grease spills. What can I do in the bathroom?  Use night lights.  Install grab bars by the toilet and in the tub and shower. Do not use towel bars as grab bars.  Use non-skid mats or decals in the tub or shower.  If you need to sit down in the shower, use a plastic, non-slip stool.  Keep the  floor dry. Clean up any water that spills on the floor as soon as it happens.  Remove soap buildup in the tub or shower regularly.  Attach bath mats securely with double-sided non-slip rug tape.  Do not have throw rugs and other things on the floor that can make you trip. What can I do in the bedroom?  Use night lights.  Make sure that you have a light by your bed that is easy to reach.  Do not use any sheets or blankets that are too big for your bed. They should not hang down onto the floor.  Have a firm chair that has side arms. You can use this for support while you get dressed.  Do not have throw rugs and other things on the floor that can make you trip. What can I do in the kitchen?  Clean up any spills right away.  Avoid walking on wet floors.  Keep items that you use a lot in easy-to-reach places.  If you need to reach something above you, use a strong step stool that has a grab bar.  Keep electrical cords out of the way.  Do not use floor  polish or wax that makes floors slippery. If you must use wax, use non-skid floor wax.  Do not have throw rugs and other things on the floor that can make you trip. What can I do with my stairs?  Do not leave any items on the stairs.  Make sure that there are handrails on both sides of the stairs and use them. Fix handrails that are broken or loose. Make sure that handrails are as long as the stairways.  Check any carpeting to make sure that it is firmly attached to the stairs. Fix any carpet that is loose or worn.  Avoid having throw rugs at the top or bottom of the stairs. If you do have throw rugs, attach them to the floor with carpet tape.  Make sure that you have a light switch at the top of the stairs and the bottom of the stairs. If you do not have them, ask someone to add them for you. What else can I do to help prevent falls?  Wear shoes that:  Do not have high heels.  Have rubber bottoms.  Are comfortable and fit  you well.  Are closed at the toe. Do not wear sandals.  If you use a stepladder:  Make sure that it is fully opened. Do not climb a closed stepladder.  Make sure that both sides of the stepladder are locked into place.  Ask someone to hold it for you, if possible.  Clearly mark and make sure that you can see:  Any grab bars or handrails.  First and last steps.  Where the edge of each step is.  Use tools that help you move around (mobility aids) if they are needed. These include:  Canes.  Walkers.  Scooters.  Crutches.  Turn on the lights when you go into a dark area. Replace any light bulbs as soon as they burn out.  Set up your furniture so you have a clear path. Avoid moving your furniture around.  If any of your floors are uneven, fix them.  If there are any pets around you, be aware of where they are.  Review your medicines with your doctor. Some medicines can make you feel dizzy. This can increase your chance of falling. Ask your doctor what other things that you can do to help prevent falls. This information is not intended to replace advice given to you by your health care provider. Make sure you discuss any questions you have with your health care provider. Document Released: 08/15/2009 Document Revised: 03/26/2016 Document Reviewed: 11/23/2014 Elsevier Interactive Patient Education  2017 [employer].

## 2018-04-08 NOTE — Patient Instructions (Signed)
   The CDC recommends two doses of Shingrix (the shingles vaccine) separated by 2 to 6 months for adults age 82 years and older. I recommend checking with your insurance plan regarding coverage for this vaccine.   

## 2018-04-09 LAB — CBC
HEMATOCRIT: 44.4 % (ref 34.0–46.6)
HEMOGLOBIN: 15.1 g/dL (ref 11.1–15.9)
MCH: 33.6 pg — ABNORMAL HIGH (ref 26.6–33.0)
MCHC: 34 g/dL (ref 31.5–35.7)
MCV: 99 fL — ABNORMAL HIGH (ref 79–97)
Platelets: 169 10*3/uL (ref 150–450)
RBC: 4.49 x10E6/uL (ref 3.77–5.28)
RDW: 14.5 % (ref 12.3–15.4)
WBC: 3.7 10*3/uL (ref 3.4–10.8)

## 2018-04-09 LAB — FERRITIN: Ferritin: 55 ng/mL (ref 15–150)

## 2018-04-09 LAB — VITAMIN D 25 HYDROXY (VIT D DEFICIENCY, FRACTURES): VIT D 25 HYDROXY: 17.2 ng/mL — AB (ref 30.0–100.0)

## 2018-04-11 ENCOUNTER — Telehealth: Payer: Self-pay

## 2018-04-11 NOTE — Telephone Encounter (Signed)
Tried calling no answer. 04/11/2018  Thanks,   -Vernona RiegerLaura

## 2018-04-11 NOTE — Telephone Encounter (Signed)
-----   Message from Malva Limesonald E Fisher, MD sent at 04/11/2018  7:48 AM EDT ----- Vitamin d levels are low. Iron and blood count are good.if not taking vitamin d3 then need to start taking 2000 units a day. If she is taking then need to double dose. Check levels yearly.

## 2018-04-13 NOTE — Telephone Encounter (Signed)
Tried calling patient. No answer. Unable to leave message due to no voice message system. 

## 2018-04-14 NOTE — Telephone Encounter (Signed)
Patient advised of results and agrees to start taking Vitamin D3 2,000units daily.

## 2018-04-22 ENCOUNTER — Other Ambulatory Visit: Payer: Self-pay

## 2018-04-22 DIAGNOSIS — M1990 Unspecified osteoarthritis, unspecified site: Secondary | ICD-10-CM

## 2018-04-23 MED ORDER — OXYCODONE HCL 10 MG PO TABS: ORAL_TABLET | ORAL | 0 refills | Status: DC

## 2018-05-24 ENCOUNTER — Other Ambulatory Visit: Payer: Self-pay | Admitting: Family Medicine

## 2018-05-24 DIAGNOSIS — M1990 Unspecified osteoarthritis, unspecified site: Secondary | ICD-10-CM

## 2018-05-24 MED ORDER — OXYCODONE HCL 10 MG PO TABS: ORAL_TABLET | ORAL | 0 refills | Status: DC

## 2018-05-24 NOTE — Telephone Encounter (Signed)
Pt contacted office for refill request on the following medications:  Oxycodone HCl 10 MG TABS   [facility] [place]  Last Rx: 04/23/18 LOV: 04/08/18 Please advise. Thanks TNP

## 2018-06-21 ENCOUNTER — Other Ambulatory Visit: Payer: Self-pay | Admitting: Family Medicine

## 2018-06-21 DIAGNOSIS — M1990 Unspecified osteoarthritis, unspecified site: Secondary | ICD-10-CM

## 2018-06-21 MED ORDER — OXYCODONE HCL 10 MG PO TABS: ORAL_TABLET | ORAL | 0 refills | Status: DC

## 2018-06-21 NOTE — Telephone Encounter (Signed)
Pt needs refill on her oxycodone 10 mg  [facility] [place]  Thanks teri

## 2018-06-24 ENCOUNTER — Telehealth: Payer: Self-pay | Admitting: Family Medicine

## 2018-06-24 NOTE — Telephone Encounter (Signed)
[facility]

## 2018-06-24 NOTE — Telephone Encounter (Signed)
Rx was canceled.  

## 2018-06-24 NOTE — Telephone Encounter (Signed)
Heather with [facility] stated pt is there trying to get her Oxycodone HCl 10 MG TABS filled and they are unable to release the medication to pt at this time due to systems outage and are no able to process pt picking up her medication. Herbert SetaHeather stated that pt is there [facility] and we send a new Rx [facility]. Please advise. Thanks TNP

## 2018-06-24 NOTE — Telephone Encounter (Signed)
Pt called about having the Rx sent to [facility]. Pt was advised a message has been sent to Dr. Sherrie MustacheFisher requesting a new Rx for Oxycodone HCl 10 MG TABS be sent to [facility]. Pt stated that she needs to be able to get this medication today. Please advise. Thanks TNP

## 2018-06-24 NOTE — Telephone Encounter (Signed)
Please advise 

## 2018-06-25 ENCOUNTER — Other Ambulatory Visit: Payer: Self-pay | Admitting: Physician Assistant

## 2018-06-25 DIAGNOSIS — M1990 Unspecified osteoarthritis, unspecified site: Secondary | ICD-10-CM

## 2018-06-25 MED ORDER — OXYCODONE HCL 10 MG PO TABS: ORAL_TABLET | ORAL | 0 refills | Status: DC

## 2018-06-25 NOTE — Progress Notes (Signed)
[facility]. [facility], their systems were down.

## 2018-07-18 ENCOUNTER — Telehealth: Payer: Self-pay | Admitting: Family Medicine

## 2018-07-18 DIAGNOSIS — M1990 Unspecified osteoarthritis, unspecified site: Secondary | ICD-10-CM

## 2018-07-18 NOTE — Telephone Encounter (Signed)
Please advise 

## 2018-07-18 NOTE — Telephone Encounter (Signed)
Pt called saying she does not want to use the generic Oxycodone 10 mg.  She wants the name brand of the Oxycodone.  She talked to her insurance and they told her they would pay for it if it was ordered and approved by her provider.  She wont be due for a refill for another week.   [facility]  Pt's CB# [phone]  Thanks  Barth Kirksteri

## 2018-07-18 NOTE — Telephone Encounter (Signed)
There is no such thing as name brand oxycodone. Oxycodone is generic.

## 2018-07-19 ENCOUNTER — Other Ambulatory Visit: Payer: Self-pay | Admitting: Family Medicine

## 2018-07-19 DIAGNOSIS — M1990 Unspecified osteoarthritis, unspecified site: Secondary | ICD-10-CM

## 2018-07-19 MED ORDER — OXYCODONE HCL 10 MG PO TABS: ORAL_TABLET | ORAL | 0 refills | Status: DC

## 2018-07-19 NOTE — Addendum Note (Signed)
Addended by: Mila MerryFISHER, DONALD E on: 07/19/2018 03:07 PM   Modules accepted: Orders

## 2018-07-19 NOTE — Telephone Encounter (Signed)
[facility] [place]?

## 2018-07-19 NOTE — Telephone Encounter (Signed)
[facility] [place]

## 2018-07-19 NOTE — Telephone Encounter (Signed)
Patient is [facility] to [facility] [place].

## 2018-07-21 NOTE — Progress Notes (Signed)
error 

## 2018-08-22 ENCOUNTER — Other Ambulatory Visit: Payer: Self-pay | Admitting: Family Medicine

## 2018-08-22 DIAGNOSIS — M1990 Unspecified osteoarthritis, unspecified site: Secondary | ICD-10-CM

## 2018-08-22 MED ORDER — OXYCODONE HCL 10 MG PO TABS: ORAL_TABLET | ORAL | 0 refills | Status: DC

## 2018-08-22 NOTE — Telephone Encounter (Signed)
Pt needs a refill on her  Oxycodone 10 mg  #150  [facility]  Not [place] [employer]

## 2018-09-20 ENCOUNTER — Other Ambulatory Visit: Payer: Self-pay | Admitting: Family Medicine

## 2018-09-20 DIAGNOSIS — M1990 Unspecified osteoarthritis, unspecified site: Secondary | ICD-10-CM

## 2018-09-20 MED ORDER — OXYCODONE HCL 10 MG PO TABS: ORAL_TABLET | ORAL | 0 refills | Status: DC

## 2018-09-20 NOTE — Telephone Encounter (Signed)
Pt needing a refill on: ° °Oxycodone HCl 10 MG TABS ° ° °Please fill at: ° °[id number] [place], [place] - [address] [place] [address] [phone] (Phone) [phone] (Fax)  ° °Thanks, °[facility] °

## 2018-10-20 ENCOUNTER — Other Ambulatory Visit: Payer: Self-pay

## 2018-10-20 DIAGNOSIS — M1990 Unspecified osteoarthritis, unspecified site: Secondary | ICD-10-CM

## 2018-10-20 MED ORDER — OXYCODONE HCL 10 MG PO TABS: ORAL_TABLET | ORAL | 0 refills | Status: DC

## 2018-10-20 NOTE — Telephone Encounter (Signed)
Patient called office requesting refill on Oxycodone, [facility] [place], patient reports that she will be out of medication by Sunday. KW

## 2018-11-21 ENCOUNTER — Other Ambulatory Visit: Payer: Self-pay

## 2018-11-21 DIAGNOSIS — M1990 Unspecified osteoarthritis, unspecified site: Secondary | ICD-10-CM

## 2018-11-21 MED ORDER — OXYCODONE HCL 10 MG PO TABS: ORAL_TABLET | ORAL | 0 refills | Status: DC

## 2018-12-15 ENCOUNTER — Other Ambulatory Visit: Payer: Self-pay

## 2018-12-15 DIAGNOSIS — M1990 Unspecified osteoarthritis, unspecified site: Secondary | ICD-10-CM

## 2018-12-15 NOTE — Telephone Encounter (Signed)
Last dispense 30 day supply 11-21-2018

## 2018-12-19 MED ORDER — OXYCODONE HCL 10 MG PO TABS: ORAL_TABLET | ORAL | 0 refills | Status: DC

## 2019-01-18 ENCOUNTER — Other Ambulatory Visit: Payer: Self-pay | Admitting: Family Medicine

## 2019-01-18 DIAGNOSIS — M1990 Unspecified osteoarthritis, unspecified site: Secondary | ICD-10-CM

## 2019-01-18 MED ORDER — OXYCODONE HCL 10 MG PO TABS: ORAL_TABLET | ORAL | 0 refills | Status: DC

## 2019-01-18 NOTE — Telephone Encounter (Signed)
Pt needing a refill on: Oxycodone HCl 10 MG TABS  Please call into:  [facility] [address], [place] - [address] [phone] ([phone] (Fax)   Thanks, [employer]

## 2019-02-16 ENCOUNTER — Other Ambulatory Visit: Payer: Self-pay

## 2019-02-16 DIAGNOSIS — M1990 Unspecified osteoarthritis, unspecified site: Secondary | ICD-10-CM

## 2019-02-16 MED ORDER — OXYCODONE HCL 10 MG PO TABS: ORAL_TABLET | ORAL | 0 refills | Status: DC

## 2019-02-16 NOTE — Telephone Encounter (Signed)
Patient request refill

## 2019-02-20 ENCOUNTER — Telehealth: Payer: Self-pay | Admitting: *Deleted

## 2019-02-20 MED ORDER — PREDNISONE 10 MG PO TABS: ORAL_TABLET | ORAL | 0 refills | Status: AC

## 2019-02-20 NOTE — Telephone Encounter (Signed)
Patient is requesting an rx for prednisone. Patient states her arthritis has flared up. [facility] [place]. Please advise?

## 2019-03-20 ENCOUNTER — Other Ambulatory Visit: Payer: Self-pay | Admitting: Family Medicine

## 2019-03-20 DIAGNOSIS — M1990 Unspecified osteoarthritis, unspecified site: Secondary | ICD-10-CM

## 2019-03-20 MED ORDER — OXYCODONE HCL 10 MG PO TABS: ORAL_TABLET | ORAL | 0 refills | Status: DC

## 2019-03-20 NOTE — Telephone Encounter (Signed)
Pt needs refill   Oxycodone 10 mg  [facility] [place]

## 2019-04-13 ENCOUNTER — Ambulatory Visit (INDEPENDENT_AMBULATORY_CARE_PROVIDER_SITE_OTHER): Payer: Medicare Other

## 2019-04-13 ENCOUNTER — Ambulatory Visit (INDEPENDENT_AMBULATORY_CARE_PROVIDER_SITE_OTHER): Payer: Medicare Other | Admitting: Family Medicine

## 2019-04-13 ENCOUNTER — Other Ambulatory Visit: Payer: Self-pay

## 2019-04-13 VITALS — BP 158/80 | HR 82 | Temp 98.7°F | Ht 62.0 in

## 2019-04-13 DIAGNOSIS — Z0000 Encounter for general adult medical examination without abnormal findings: Secondary | ICD-10-CM

## 2019-04-13 DIAGNOSIS — M1990 Unspecified osteoarthritis, unspecified site: Secondary | ICD-10-CM | POA: Diagnosis not present

## 2019-04-13 DIAGNOSIS — Z23 Encounter for immunization: Secondary | ICD-10-CM | POA: Diagnosis not present

## 2019-04-13 DIAGNOSIS — H9193 Unspecified hearing loss, bilateral: Secondary | ICD-10-CM

## 2019-04-13 DIAGNOSIS — G894 Chronic pain syndrome: Secondary | ICD-10-CM | POA: Diagnosis not present

## 2019-04-13 DIAGNOSIS — F331 Major depressive disorder, recurrent, moderate: Secondary | ICD-10-CM | POA: Diagnosis not present

## 2019-04-13 DIAGNOSIS — D649 Anemia, unspecified: Secondary | ICD-10-CM

## 2019-04-13 DIAGNOSIS — H9190 Unspecified hearing loss, unspecified ear: Secondary | ICD-10-CM | POA: Insufficient documentation

## 2019-04-13 MED ORDER — DULOXETINE HCL 20 MG PO CPEP: 20.0000 mg | ORAL_CAPSULE | Freq: Every day | ORAL | 3 refills | Status: DC

## 2019-04-13 MED ORDER — OXYCODONE HCL 10 MG PO TABS: ORAL_TABLET | ORAL | 0 refills | Status: DC

## 2019-04-13 NOTE — Assessment & Plan Note (Signed)
Patient with significant difficulty hearing during the visit today This is worsened by the use of masks in the setting of the COVID-19 pandemic She knows that she needs hearing aids, but has difficulty affording these and does not know what resources are available to her Hoping that [facility] will be able to assist in helping her get hearing aids

## 2019-04-13 NOTE — Patient Instructions (Signed)
Angela Young , Thank you for taking time to come for your Medicare Wellness Visit. I appreciate your ongoing commitment to your health goals. Please review the following plan we discussed and let me know if I can assist you in the future.   Screening recommendations/referrals: Colonoscopy: No longer required.  Mammogram: No longer required.  Bone Density: Pt declined referral today.  Recommended yearly ophthalmology/optometry visit for glaucoma screening and checkup Recommended yearly dental visit for hygiene and checkup  Vaccinations: Influenza vaccine: Due fall 2020 Pneumococcal vaccine: Completed series Tdap vaccine: Up to date, due 09/2024 Shingles vaccine: Pt declines today.     Advanced directives: Advance directive discussed with you today. Even though you declined this today please call our office should you change your mind and we can give you the proper paperwork for you to fill out.  Conditions/risks identified: Recommend to increase water intake to 6-8 8 oz glasses a day.   Next appointment: 2:20 PM today with Dr Brita Romp.    Preventive Care 41 Years and Older, Female Preventive care refers to lifestyle choices and visits with your health care provider that can promote health and wellness. What does preventive care include?  A yearly physical exam. This is also called an annual well check.  Dental exams once or twice a year.  Routine eye exams. Ask your health care provider how often you should have your eyes checked.  Personal lifestyle choices, including:  Daily care of your teeth and gums.  Regular physical activity.  Eating a healthy diet.  Avoiding tobacco and drug use.  Limiting alcohol use.  Practicing safe sex.  Taking low-dose aspirin every day.  Taking vitamin and mineral supplements as recommended by your health care provider. What happens during an annual well check? The services and screenings done by your health care provider during your  annual well check will depend on your age, overall health, lifestyle risk factors, and family history of disease. Counseling  Your health care provider may ask you questions about your:  Alcohol use.  Tobacco use.  Drug use.  Emotional well-being.  Home and relationship well-being.  Sexual activity.  Eating habits.  History of falls.  Memory and ability to understand (cognition).  Work and work Statistician.  Reproductive health. Screening  You may have the following tests or measurements:  Height, weight, and BMI.  Blood pressure.  Lipid and cholesterol levels. These may be checked every 5 years, or more frequently if you are over 95 years old.  Skin check.  Lung cancer screening. You may have this screening every year starting at age 56 if you have a 30-pack-year history of smoking and currently smoke or have quit within the past 15 years.  Fecal occult blood test (FOBT) of the stool. You may have this test every year starting at age 85.  Flexible sigmoidoscopy or colonoscopy. You may have a sigmoidoscopy every 5 years or a colonoscopy every 10 years starting at age 54.  Hepatitis C blood test.  Hepatitis B blood test.  Sexually transmitted disease (STD) testing.  Diabetes screening. This is done by checking your blood sugar (glucose) after you have not eaten for a while (fasting). You may have this done every 1-3 years.  Bone density scan. This is done to screen for osteoporosis. You may have this done starting at age 13.  Mammogram. This may be done every 1-2 years. Talk to your health care provider about how often you should have regular mammograms. Talk with your health  care provider about your test results, treatment options, and if necessary, the need for more tests. Vaccines  Your health care provider may recommend certain vaccines, such as:  Influenza vaccine. This is recommended every year.  Tetanus, diphtheria, and acellular pertussis (Tdap, Td)  vaccine. You may need a Td booster every 10 years.  Zoster vaccine. You may need this after age 34.  Pneumococcal 13-valent conjugate (PCV13) vaccine. One dose is recommended after age 69.  Pneumococcal polysaccharide (PPSV23) vaccine. One dose is recommended after age 12. Talk to your health care provider about which screenings and vaccines you need and how often you need them. This information is not intended to replace advice given to you by your health care provider. Make sure you discuss any questions you have with your health care provider. Document Released: 11/15/2015 Document Revised: 07/08/2016 Document Reviewed: 08/20/2015 Elsevier Interactive Patient Education  2017 [place] Prevention in the Home Falls can cause injuries. They can happen to people of all ages. There are many things you can do to make your home safe and to help prevent falls. What can I do on the outside of my home?  Regularly fix the edges of walkways and driveways and fix any cracks.  Remove anything that might make you trip as you walk through a door, such as a raised step or threshold.  Trim any bushes or trees on the path to your home.  Use bright outdoor lighting.  Clear any walking paths of anything that might make someone trip, such as rocks or tools.  Regularly check to see if handrails are loose or broken. Make sure that both sides of any steps have handrails.  Any raised decks and porches should have guardrails on the edges.  Have any leaves, snow, or ice cleared regularly.  Use sand or salt on walking paths during winter.  Clean up any spills in your garage right away. This includes oil or grease spills. What can I do in the bathroom?  Use night lights.  Install grab bars by the toilet and in the tub and shower. Do not use towel bars as grab bars.  Use non-skid mats or decals in the tub or shower.  If you need to sit down in the shower, use a plastic, non-slip stool.   Keep the floor dry. Clean up any water that spills on the floor as soon as it happens.  Remove soap buildup in the tub or shower regularly.  Attach bath mats securely with double-sided non-slip rug tape.  Do not have throw rugs and other things on the floor that can make you trip. What can I do in the bedroom?  Use night lights.  Make sure that you have a light by your bed that is easy to reach.  Do not use any sheets or blankets that are too big for your bed. They should not hang down onto the floor.  Have a firm chair that has side arms. You can use this for support while you get dressed.  Do not have throw rugs and other things on the floor that can make you trip. What can I do in the kitchen?  Clean up any spills right away.  Avoid walking on wet floors.  Keep items that you use a lot in easy-to-reach places.  If you need to reach something above you, use a strong step stool that has a grab bar.  Keep electrical cords out of the way.  Do not use floor  polish or wax that makes floors slippery. If you must use wax, use non-skid floor wax.  Do not have throw rugs and other things on the floor that can make you trip. What can I do with my stairs?  Do not leave any items on the stairs.  Make sure that there are handrails on both sides of the stairs and use them. Fix handrails that are broken or loose. Make sure that handrails are as long as the stairways.  Check any carpeting to make sure that it is firmly attached to the stairs. Fix any carpet that is loose or worn.  Avoid having throw rugs at the top or bottom of the stairs. If you do have throw rugs, attach them to the floor with carpet tape.  Make sure that you have a light switch at the top of the stairs and the bottom of the stairs. If you do not have them, ask someone to add them for you. What else can I do to help prevent falls?  Wear shoes that:  Do not have high heels.  Have rubber bottoms.  Are  comfortable and fit you well.  Are closed at the toe. Do not wear sandals.  If you use a stepladder:  Make sure that it is fully opened. Do not climb a closed stepladder.  Make sure that both sides of the stepladder are locked into place.  Ask someone to hold it for you, if possible.  Clearly mark and make sure that you can see:  Any grab bars or handrails.  First and last steps.  Where the edge of each step is.  Use tools that help you move around (mobility aids) if they are needed. These include:  Canes.  Walkers.  Scooters.  Crutches.  Turn on the lights when you go into a dark area. Replace any light bulbs as soon as they burn out.  Set up your furniture so you have a clear path. Avoid moving your furniture around.  If any of your floors are uneven, fix them.  If there are any pets around you, be aware of where they are.  Review your medicines with your doctor. Some medicines can make you feel dizzy. This can increase your chance of falling. Ask your doctor what other things that you can do to help prevent falls. This information is not intended to replace advice given to you by your health care provider. Make sure you discuss any questions you have with your health care provider. Document Released: 08/15/2009 Document Revised: 03/26/2016 Document Reviewed: 11/23/2014 Elsevier Interactive Patient Education  2017 [employer].

## 2019-04-13 NOTE — Assessment & Plan Note (Signed)
History of Asymptomatic Recheck CBC

## 2019-04-13 NOTE — Progress Notes (Signed)
Patient: Angela DexterGlenda M Gracy, Female    DOB: 1931/01/13, 85 y.o.   MRN: [id number] Visit Date: 04/13/2019  Today's Provider: Shirlee LatchAngela Bacigalupo, MD   Chief Complaint  Patient presents with  . Annual Exam   Subjective:   Patient had a AWE with [facility] prior to appointment.    Annual physical exam Angela Young is a 85 y.o. female who presents today for health maintenance and complete physical. She feels fairly well. She reports exercising doing yardwork. She reports she is sleeping fairly well.  ----------------------------------------------------------------- Depression Feels like oxycodone dulls her energy and appetite It does help her sleep Has been on oxycodone for many years Finds that now she only gets 1-2 hours of relief from each dose.   Feels like she wore out Camphor, OTC analgesics, shots    Review of Systems  Constitutional: Negative.   HENT: Negative.   Eyes: Negative.   Respiratory: Negative.   Cardiovascular: Negative.   Gastrointestinal: Negative.   Endocrine: Negative.   Genitourinary: Negative.   Musculoskeletal: Negative.   Skin: Negative.   Allergic/Immunologic: Negative.   Neurological: Negative.   Hematological: Negative.   Psychiatric/Behavioral: Negative.     Social History      She  reports that she has quit smoking. Her smoking use included cigarettes. She has never used smokeless tobacco. She reports that she does not drink alcohol or use drugs.       Social History   Socioeconomic History  . Marital status: Divorced    Spouse name: Not on file  . Number of children: 3  . Years of education: Not on file  . Highest education level: 12th grade  Occupational History  . Not on file  Social Needs  . Financial resource strain: Not hard at all  . Food insecurity    Worry: Never true    Inability: Never true  . Transportation needs    Medical: No    Non-medical: No  Tobacco Use  . Smoking status: Former Smoker    Types:  Cigarettes  . Smokeless tobacco: Never Used  . Tobacco comment: >15 years ago  Substance and Sexual Activity  . Alcohol use: No  . Drug use: No  . Sexual activity: Not on file  Lifestyle  . Physical activity    Days per week: 0 days    Minutes per session: 0 min  . Stress: Rather much  Relationships  . Social Musicianconnections    Talks on phone: Patient refused    Gets together: Patient refused    Attends religious service: Patient refused    Active member of club or organization: Patient refused    Attends meetings of clubs or organizations: Patient refused    Relationship status: Patient refused  Other Topics Concern  . Not on file  Social History Narrative  . Not on file    Past Medical History:  Diagnosis Date  . Depression      Patient Active Problem List   Diagnosis Date Noted  . Chronic pain syndrome 04/13/2019  . Hard of hearing 04/13/2019  . Osteoarthritis of both hands 10/01/2017  . Anemia 04/07/2017  . Paresthesia 12/13/2015  . Abnormal ECG 09/09/2015  . Allergic rhinitis 09/09/2015  . Benign cyst of breast 09/09/2015  . Body mass index (BMI) of 26.0-26.9 in adult 09/09/2015  . CFIDS (chronic fatigue and immune dysfunction syndrome) (HCC) 09/09/2015  . MDD (major depressive disorder) 09/09/2015  . Bladder cystocele 09/09/2015  . Acid  reflux 09/09/2015  . 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 History of polycythemia 09/09/2015  . Vitamin D deficiency 09/09/2015  . Dermatitis 09/09/2015  . Chronic osteoarthritis 04/30/2015  . Spondylosis without myelopathy 05/11/2006    Past Surgical History:  Procedure Laterality Date  . ABDOMINAL HYSTERECTOMY    . BREAST LUMPECTOMY    . EYE SURGERY     due to trauma  . HIP SURGERY Right 05/13/11    total hip replacement dr Maureen Ralphs; left 07/11/08 Dr Marry Guan  . KNEE SURGERY Left 10/10  . resection of right carotid tumor     benign per pt    Family History        Family Status  Relation Name Status  . Mother  Deceased  at age 9  . Father  Deceased at age 19       bone cancer  . Sister  Alive  . Brother  Alive  . Sister  Alive  . Daughter  Alive  . Son  Alive  . Son  Alive        Her family history includes Anemia in her mother; Arthritis in her brother, mother, and sister; Heart disease in her sister; Stroke in her mother.      Allergies  Allergen Reactions  . Ether      Current Outpatient Medications:  .  caffeine 200 MG TABS tablet, Take 200 mg by mouth every 4 (four) hours as needed., Disp: , Rfl:  .  Cholecalciferol (VITAMIN D) 2000 UNITS CAPS, Take by mouth., Disp: , Rfl:  .  Cranberry 1000 MG CAPS, Take by mouth. occsaionally, Disp: , Rfl:  .  DULoxetine (CYMBALTA) 20 MG capsule, Take 1 capsule (20 mg total) by mouth daily., Disp: 30 capsule, Rfl: 3 .  [place] Ginseng 100 MG CAPS, Take 2 capsules by mouth daily., Disp: , Rfl:  .  Magnesium 250 MG TABS, Take 250 mg by mouth at bedtime. As needed, Disp: , Rfl:  .  magnesium 30 MG tablet, Take 30 mg by mouth 2 (two) times daily., Disp: , Rfl:  .  Multiple Vitamins-Minerals (ENERGY BOOSTER) PACK, Take by mouth. occasionally, Disp: , Rfl:  .  [START ON 04/20/2019] Oxycodone HCl 10 MG TABS, One tablet every 4-6 hours, up to 5 tablets a day, Disp: 150 tablet, Rfl: 0   Patient Care Team: Virginia Crews, MD as PCP - General (Family Medicine)    Objective:    Vitals: BP (!) 158/80 (BP Location: Right Arm, Patient Position: Sitting, Cuff Size: Normal)   Pulse 82   Temp 98.7 F (37.1 C) (Oral)   Ht 5\' 2"  (1.575 m)   BMI 24.73 kg/m    Vitals:   04/13/19 1340  BP: (!) 158/80  Pulse: 82  Temp: 98.7 F (37.1 C)  TempSrc: Oral  Height: 5\' 2"  (1.575 m)     Physical Exam Vitals signs reviewed.  Constitutional:      General: She is not in acute distress.    Appearance: Normal appearance. She is well-developed. She is not diaphoretic.  HENT:     Head: Normocephalic and atraumatic.     Right Ear: Tympanic membrane, ear canal and  external ear normal.     Left Ear: Tympanic membrane, ear canal and external ear normal.  Eyes:     General: No scleral icterus.    Conjunctiva/sclera: Conjunctivae normal.     Pupils: Pupils are equal, round, and reactive to light.  Neck:     Musculoskeletal: Neck  supple.     Thyroid: No thyromegaly.  Cardiovascular:     Rate and Rhythm: Normal rate and regular rhythm.     Heart sounds: Normal heart sounds. No murmur.  Pulmonary:     Effort: Pulmonary effort is normal. No respiratory distress.     Breath sounds: Normal breath sounds. No wheezing or rales.  Abdominal:     General: There is no distension.     Palpations: Abdomen is soft.     Tenderness: There is no abdominal tenderness.  Musculoskeletal:     Right lower leg: No edema.     Left lower leg: No edema.     Comments: Walks with 4 pronged cane Antalgic gait  Lymphadenopathy:     Cervical: No cervical adenopathy.  Skin:    General: Skin is warm and dry.     Capillary Refill: Capillary refill takes less than 2 seconds.     Findings: No rash.  Neurological:     Mental Status: She is alert and oriented to person, place, and time.  Psychiatric:        Attention and Perception: Attention normal.        Mood and Affect: Mood is depressed. Affect is flat.        Speech: Speech normal.        Behavior: Behavior normal.        Thought Content: Thought content does not include homicidal or suicidal ideation.      Depression Screen PHQ 2/9 Scores 04/13/2019 04/08/2018 04/07/2017 09/09/2015  PHQ - 2 Score 6 6 0 4  PHQ- 9 Score 15 9 3 9        Assessment & Plan:     Routine Health Maintenance and Physical Exam  Exercise Activities and Dietary recommendations Goals    . DIET - INCREASE WATER INTAKE     Recommend increasing water intake to 4 glasses a day.       Immunization History  Administered Date(s) Administered  . Pneumococcal Conjugate-13 04/08/2018  . Pneumococcal Polysaccharide-23 04/13/2019    Health  Maintenance  Topic Date Due  . DEXA SCAN  08/24/1998  . INFLUENZA VACCINE  06/03/2019  . TETANUS/TDAP  09/19/2024  . PNA vac Low Risk Adult  Completed     Discussed health benefits of physical activity, and encouraged her to engage in regular exercise appropriate for her age and condition.    Patient declines DEXA as she would not take medications for this even if + for osteoporosis --------------------------------------------------------------------  Problem List Items Addressed This Visit      Nervous and Auditory   Hard of hearing    Patient with significant difficulty hearing during the visit today This is worsened by the use of masks in the setting of the COVID-19 pandemic She knows that she needs hearing aids, but has difficulty affording these and does not know what resources are available to her Hoping that CCM will be able to assist in helping her get hearing aids        Musculoskeletal and Integument   Chronic osteoarthritis    As below, patient with chronic pain syndrome related to chronic osteoarthritis She is status post multiple joint replacements We will continue her chronic pain medication, but also start Cymbalta as below      Relevant Medications   Oxycodone HCl 10 MG TABS (Start on 04/20/2019)   Other Relevant Orders   Referral to Chronic Care Management Services     Other   MDD (major depressive  disorder)    New problem Worsened by stress [facility] Also worsened by her chronic pain that is uncontrolled She has not taken a medication in the past for depression or anxiety, but would like to do so now We will start Cymbalta 20 mg daily Discussed that it can take 6 to 8 weeks to reach full efficacy Discussed potential side effects, including decreased libido, initial increase in anxiety, and GI upset Contracted for safety, no SI/HI Referral to CCM for possible counseling resources Follow-up in  6 weeks and consider dose titration Repeat PHQ 9 and GAD 7 at next visit      Relevant Medications   DULoxetine (CYMBALTA) 20 MG capsule   Other Relevant Orders   CBC   Referral to Chronic Care Management Services   TSH   Anemia    History of Asymptomatic Recheck CBC      Relevant Orders   CBC   Referral to Chronic Care Management Services   Chronic pain syndrome    Chronic and uncontrolled Related to osteoarthritis of multiple joints She has been taking oxycodone 10 mg every 4-6 hours for many years We discussed chronic narcotic therapy and how this can actually lead to hyperalgesia Given that she is having depression symptoms as well as ongoing chronic pain, we will start Cymbalta 20 mg daily and titrate dose as able She hopes to be able to down titrate her oxycodone in future if possible      Relevant Medications   DULoxetine (CYMBALTA) 20 MG capsule   Oxycodone HCl 10 MG TABS (Start on 04/20/2019)   Other Relevant Orders   Referral to Chronic Care Management Services    Other Visit Diagnoses    Encounter for annual physical exam    -  Primary   Relevant Orders   Lipid panel   Comprehensive metabolic panel   CBC   TSH   Need for pneumococcal vaccination       Relevant Orders   Pneumococcal polysaccharide vaccine 23-valent greater than or equal to 2yo subcutaneous/IM (Completed)       Return in about 6 weeks (around 05/25/2019) for MDD and chronic pain .   The entirety of the information documented in the History of Present Illness, Review of Systems and Physical Exam were personally obtained by me. Portions of this information were initially documented by Presley RaddleNikki Walston, CMA and reviewed by me for thoroughness and accuracy.    Bacigalupo, Marzella SchleinAngela M, MD MPH [facility]

## 2019-04-13 NOTE — Patient Instructions (Signed)
Preventive Care 83 Years and Older, Female Preventive care refers to lifestyle choices and visits with your health care provider that can promote health and wellness. What does preventive care include?  A yearly physical exam. This is also called an annual well check.  Dental exams once or twice a year.  Routine eye exams. Ask your health care provider how often you should have your eyes checked.  Personal lifestyle choices, including: ? Daily care of your teeth and gums. ? Regular physical activity. ? Eating a healthy diet. ? Avoiding tobacco and drug use. ? Limiting alcohol use. ? Practicing safe sex. ? Taking low-dose aspirin every day. ? Taking vitamin and mineral supplements as recommended by your health care provider. What happens during an annual well check? The services and screenings done by your health care provider during your annual well check will depend on your age, overall health, lifestyle risk factors, and family history of disease. Counseling Your health care provider may ask you questions about your:  Alcohol use.  Tobacco use.  Drug use.  Emotional well-being.  Home and relationship well-being.  Sexual activity.  Eating habits.  History of falls.  Memory and ability to understand (cognition).  Work and work Statistician.  Reproductive health.  Screening You may have the following tests or measurements:  Height, weight, and BMI.  Blood pressure.  Lipid and cholesterol levels. These may be checked every 5 years, or more frequently if you are over 30 years old.  Skin check.  Lung cancer screening. You may have this screening every year starting at age 27 if you have a 30-pack-year history of smoking and currently smoke or have quit within the past 15 years.  Colorectal cancer screening. All adults should have this screening starting at age 33 and continuing until age 46. You will have tests every 1-10 years, depending on your results and the  type of screening test. People at increased risk should start screening at an earlier age. Screening tests may include: ? Guaiac-based fecal occult blood testing. ? Fecal immunochemical test (FIT). ? Stool DNA test. ? Virtual colonoscopy. ? Sigmoidoscopy. During this test, a flexible tube with a tiny camera (sigmoidoscope) is used to examine your rectum and lower colon. The sigmoidoscope is inserted through your anus into your rectum and lower colon. ? Colonoscopy. During this test, a long, thin, flexible tube with a tiny camera (colonoscope) is used to examine your entire colon and rectum.  Hepatitis C blood test.  Hepatitis B blood test.  Sexually transmitted disease (STD) testing.  Diabetes screening. This is done by checking your blood sugar (glucose) after you have not eaten for a while (fasting). You may have this done every 1-3 years.  Bone density scan. This is done to screen for osteoporosis. You may have this done starting at age 37.  Mammogram. This may be done every 1-2 years. Talk to your health care provider about how often you should have regular mammograms. Talk with your health care provider about your test results, treatment options, and if necessary, the need for more tests. Vaccines Your health care provider may recommend certain vaccines, such as:  Influenza vaccine. This is recommended every year.  Tetanus, diphtheria, and acellular pertussis (Tdap, Td) vaccine. You may need a Td booster every 10 years.  Varicella vaccine. You may need this if you have not been vaccinated.  Zoster vaccine. You may need this after age 38.  Measles, mumps, and rubella (MMR) vaccine. You may need at least  one dose of MMR if you were born in 1957 or later. You may also need a second dose.  Pneumococcal 13-valent conjugate (PCV13) vaccine. One dose is recommended after age 24.  Pneumococcal polysaccharide (PPSV23) vaccine. One dose is recommended after age 24.  Meningococcal  vaccine. You may need this if you have certain conditions.  Hepatitis A vaccine. You may need this if you have certain conditions or if you travel or work in places where you may be exposed to hepatitis A.  Hepatitis B vaccine. You may need this if you have certain conditions or if you travel or work in places where you may be exposed to hepatitis B.  Haemophilus influenzae type b (Hib) vaccine. You may need this if you have certain conditions. Talk to your health care provider about which screenings and vaccines you need and how often you need them. This information is not intended to replace advice given to you by your health care provider. Make sure you discuss any questions you have with your health care provider. Document Released: 11/15/2015 Document Revised: 12/09/2017 Document Reviewed: 08/20/2015 Elsevier Interactive Patient Education  2019 [employer].

## 2019-04-13 NOTE — Progress Notes (Signed)
Subjective:   Angela Young is a 85 y.o. female who presents for Medicare Annual (Subsequent) preventive examination.  Review of Systems:  N/A  Cardiac Risk Factors include: advanced age (>1055men, 4>65 women)     Objective:     Vitals: BP (!) 158/80 (BP Location: Right Arm)   Pulse 82   Temp 98.7 F (37.1 C) (Oral)   Ht 5\' 2"  (1.575 m)   BMI 24.73 kg/m   Body mass index is 24.73 kg/m.  Advanced Directives 04/13/2019 04/08/2018 09/09/2015  Does Patient Have a Medical Advance Directive? No No No  Would patient like information on creating a medical advance directive? No - Patient declined No - Patient declined -    Tobacco Social History   Tobacco Use  Smoking Status Former Smoker  . Types: Cigarettes  Smokeless Tobacco Never Used  Tobacco Comment   >15 years ago     Counseling given: Not Answered Comment: >15 years ago   Clinical Intake:  Pre-visit preparation completed: Yes  Pain : No/denies pain Pain Score: 0-No pain     Nutritional Status: BMI of 19-24  Normal Nutritional Risks: None Diabetes: No  How often do you need to have someone help you when you read instructions, pamphlets, or other written materials from your doctor or pharmacy?: 1 - Never  Interpreter Needed?: No  Information entered by :: [facility], LPN  Past Medical History:  Diagnosis Date  . Depression    Past Surgical History:  Procedure Laterality Date  . ABDOMINAL HYSTERECTOMY    . BREAST LUMPECTOMY    . EYE SURGERY     due to trauma  . HIP SURGERY Right 05/13/11    total hip replacement dr Despina HickAlusio; left 07/11/08 Dr Ernest Pinehooten  . KNEE SURGERY Left 10/10  . resection of right carotid tumor     benign per pt   Family History  Problem Relation Age of Onset  . Arthritis Mother   . Anemia Mother   . Stroke Mother   . Arthritis Sister   . Heart disease Sister   . Arthritis Brother    Social History   Socioeconomic History  . Marital status: Divorced    Spouse name: Not on  file  . Number of children: 3  . Years of education: Not on file  . Highest education level: 12th grade  Occupational History  . Not on file  Social Needs  . Financial resource strain: Not hard at all  . Food insecurity    Worry: Never true    Inability: Never true  . Transportation needs    Medical: No    Non-medical: No  Tobacco Use  . Smoking status: Former Smoker    Types: Cigarettes  . Smokeless tobacco: Never Used  . Tobacco comment: >15 years ago  Substance and Sexual Activity  . Alcohol use: No  . Drug use: No  . Sexual activity: Not on file  Lifestyle  . Physical activity    Days per week: 0 days    Minutes per session: 0 min  . Stress: Rather much  Relationships  . Social Musicianconnections    Talks on phone: Patient refused    Gets together: Patient refused    Attends religious service: Patient refused    Active member of club or organization: Patient refused    Attends meetings of clubs or organizations: Patient refused    Relationship status: Patient refused  Other Topics Concern  . Not on file  Social History  Narrative  . Not on file    Outpatient Encounter Medications as of 04/13/2019  Medication Sig  . caffeine 200 MG TABS tablet Take 200 mg by mouth every 4 (four) hours as needed.  . Cranberry 1000 MG CAPS Take by mouth. occsaionally  . Magnesium 250 MG TABS Take 250 mg by mouth at bedtime. As needed  . magnesium 30 MG tablet Take 30 mg by mouth 2 (two) times daily.  . Multiple Vitamins-Minerals (ENERGY BOOSTER) PACK Take by mouth. occasionally  . Oxycodone HCl 10 MG TABS One tablet every 4-6 hours, up to 5 tablets a day  . Cholecalciferol (VITAMIN D) 2000 UNITS CAPS Take by mouth.  Tedra Coupe. Korean Ginseng 100 MG CAPS Take 2 capsules by mouth daily.   No facility-administered encounter medications on file as of 04/13/2019.     Activities of Daily Living In your present state of health, do you have any difficulty performing the following activities: 04/13/2019   Hearing? Y  Comment Interested in getting hearing aids.  Vision? Y  Comment Needs a new eye glass prescription.  Difficulty concentrating or making decisions? Y  Walking or climbing stairs? Y  Comment Due to "pains all over."  Dressing or bathing? N  Doing errands, shopping? N  Preparing Food and eating ? N  Using the Toilet? N  In the past six months, have you accidently leaked urine? Y  Comment Occasionally if drinking caffeine.  Do you have problems with loss of bowel control? N  Managing your Medications? N  Managing your Finances? N  Housekeeping or managing your Housekeeping? N  Some recent data might be hidden    Patient Care Team: Erasmo DownerBacigalupo, Angela M, MD as PCP - General (Family Medicine)    Assessment:   This is a routine wellness examination for Angela Young.  Exercise Activities and Dietary recommendations Current Exercise Habits: The patient does not participate in regular exercise at present, Exercise limited by: orthopedic condition(s)  Goals    . DIET - INCREASE WATER INTAKE     Recommend increasing water intake to 4 glasses a day.       Fall Risk: Fall Risk  04/13/2019 04/08/2018 04/07/2017 09/09/2015  Falls in the past year? 0 Yes No No  Number falls in past yr: - 1 - -  Follow up - Falls prevention discussed - -    FALL RISK PREVENTION PERTAINING TO THE HOME:  Any stairs in or around the home? Yes  If so, are there any without handrails? No   Home free of loose throw rugs in walkways, pet beds, electrical cords, etc? Yes  Adequate lighting in your home to reduce risk of falls? Yes   ASSISTIVE DEVICES UTILIZED TO PREVENT FALLS:  Life alert? No  Use of a cane, walker or w/c? Yes  Grab bars in the bathroom? No  Shower chair or bench in shower? No  Elevated toilet seat or a handicapped toilet? No    TIMED UP AND GO:  Was the test performed? No .    Depression Screen PHQ 2/9 Scores 04/13/2019 04/08/2018 04/07/2017 09/09/2015  PHQ - 2 Score 6 6 0 4   PHQ- 9 Score 15 9 3 9      Cognitive Function: Declined today.      6CIT Screen 04/08/2018  What Year? 0 points  What month? 0 points  What time? 0 points  Count back from 20 0 points  Months in reverse 0 points  Repeat phrase 0 points  Total Score  0    Immunization History  Administered Date(s) Administered  . Pneumococcal Conjugate-13 04/08/2018    Qualifies for Shingles Vaccine? Yes . Due for Shingrix. Education has been provided regarding the importance of this vaccine. Pt has been advised to call insurance company to determine out of pocket expense. Advised may also receive vaccine at local pharmacy or Health Dept. Verbalized acceptance and understanding.  Tdap: Up to date  Flu Vaccine: Due fall 2020  Pneumococcal Vaccine: Completed series  Screening Tests Health Maintenance  Topic Date Due  . DEXA SCAN  08/24/1998  . INFLUENZA VACCINE  06/03/2019  . TETANUS/TDAP  09/19/2024  . PNA vac Low Risk Adult  Completed    Cancer Screenings:  Colorectal Screening: No longer required.   Mammogram: No longer required.   Bone Density: Completed years ago per pt. Unsure of results. Declined referral today.   Lung Cancer Screening: (Low Dose CT Chest recommended if Age 63-80 years, 30 pack-year currently smoking OR have quit w/in 15years.) does not qualify.   Additional Screening:  Vision Screening: Recommended annual ophthalmology exams for early detection of glaucoma and other disorders of the eye.  Dental Screening: Recommended annual dental exams for proper oral hygiene  Community Resource Referral:  CRR required this visit?  No       Plan:  I have personally reviewed and addressed the Medicare Annual Wellness questionnaire and have noted the following in the patient's chart:  A. Medical and social history B. Use of alcohol, tobacco or illicit drugs  C. Current medications and supplements D. Functional ability and status E.  Nutritional status F.  Physical  activity G. Advance directives H. List of other physicians I.  Hospitalizations, surgeries, and ER visits in previous 12 months J.  [place] such as hearing and vision if needed, cognitive and depression L. Referrals and appointments   In addition, I have reviewed and discussed with patient certain preventive protocols, quality metrics, and best practice recommendations. A written personalized care plan for preventive services as well as general preventive health recommendations were provided to patient. Nurse Health Advisor  Signed,    Quentin Strebel [place], [place]  0/01/4916 Nurse Health Advisor   Nurse Notes: Declined a referral for a DEXA scan.

## 2019-04-13 NOTE — Assessment & Plan Note (Signed)
Chronic and uncontrolled Related to osteoarthritis of multiple joints She has been taking oxycodone 10 mg every 4-6 hours for many years We discussed chronic narcotic therapy and how this can actually lead to hyperalgesia Given that she is having depression symptoms as well as ongoing chronic pain, we will start Cymbalta 20 mg daily and titrate dose as able She hopes to be able to down titrate her oxycodone in future if possible

## 2019-04-13 NOTE — Assessment & Plan Note (Signed)
New problem Worsened by stress of her son being at [facility] when they have a COVID-19 outbreak Also worsened by her chronic pain that is uncontrolled She has not taken a medication in the past for depression or anxiety, but would like to do so now We will start Cymbalta 20 mg daily Discussed that it can take 6 to 8 weeks to reach full efficacy Discussed potential side effects, including decreased libido, initial increase in anxiety, and GI upset Contracted for safety, no SI/HI Referral to CCM for possible counseling resources Follow-up in 6 weeks and consider dose titration Repeat PHQ 9 and GAD 7 at next visit

## 2019-04-13 NOTE — Assessment & Plan Note (Signed)
As below, patient with chronic pain syndrome related to chronic osteoarthritis She is status post multiple joint replacements We will continue her chronic pain medication, but also start Cymbalta as below

## 2019-04-14 ENCOUNTER — Ambulatory Visit: Payer: Self-pay | Admitting: Pharmacist

## 2019-04-14 DIAGNOSIS — F331 Major depressive disorder, recurrent, moderate: Secondary | ICD-10-CM

## 2019-04-14 DIAGNOSIS — G894 Chronic pain syndrome: Secondary | ICD-10-CM

## 2019-04-14 DIAGNOSIS — M1990 Unspecified osteoarthritis, unspecified site: Secondary | ICD-10-CM

## 2019-04-14 LAB — COMPREHENSIVE METABOLIC PANEL
ALT: 20 IU/L (ref 0–32)
AST: 23 IU/L (ref 0–40)
Albumin/Globulin Ratio: 2.5 — ABNORMAL HIGH (ref 1.2–2.2)
Albumin: 4.2 g/dL (ref 3.6–4.6)
Alkaline Phosphatase: 66 IU/L (ref 39–117)
BUN/Creatinine Ratio: 22 (ref 12–28)
BUN: 20 mg/dL (ref 8–27)
Bilirubin Total: 1.6 mg/dL — ABNORMAL HIGH (ref 0.0–1.2)
CO2: 22 mmol/L (ref 20–29)
Calcium: 9.2 mg/dL (ref 8.7–10.3)
Chloride: 105 mmol/L (ref 96–106)
Creatinine, Ser: 0.91 mg/dL (ref 0.57–1.00)
GFR calc Af Amer: 67 mL/min/{1.73_m2} (ref >59)
GFR calc non Af Amer: 58 mL/min/{1.73_m2} — ABNORMAL LOW (ref >59)
Globulin, Total: 1.7 g/dL (ref 1.5–4.5)
Glucose: 85 mg/dL (ref 65–99)
Potassium: 4.1 mmol/L (ref 3.5–5.2)
Sodium: 140 mmol/L (ref 134–144)
Total Protein: 5.9 g/dL — ABNORMAL LOW (ref 6.0–8.5)

## 2019-04-14 LAB — LIPID PANEL
Chol/HDL Ratio: 2.9 ratio (ref 0.0–4.4)
Cholesterol, Total: 200 mg/dL — ABNORMAL HIGH (ref 100–199)
HDL: 70 mg/dL (ref >39)
LDL Calculated: 115 mg/dL — ABNORMAL HIGH (ref 0–99)
Triglycerides: 74 mg/dL (ref 0–149)
VLDL Cholesterol Cal: 15 mg/dL (ref 5–40)

## 2019-04-14 LAB — CBC
Hematocrit: 43.4 % (ref 34.0–46.6)
Hemoglobin: 14.7 g/dL (ref 11.1–15.9)
MCH: 32.9 pg (ref 26.6–33.0)
MCHC: 33.9 g/dL (ref 31.5–35.7)
MCV: 97 fL (ref 79–97)
Platelets: 147 10*3/uL — ABNORMAL LOW (ref 150–450)
RBC: 4.47 x10E6/uL (ref 3.77–5.28)
RDW: 13.1 % (ref 11.7–15.4)
WBC: 3.9 10*3/uL (ref 3.4–10.8)

## 2019-04-14 LAB — TSH: TSH: 0.955 u[IU]/mL (ref 0.450–4.500)

## 2019-04-17 ENCOUNTER — Telehealth: Payer: Self-pay

## 2019-04-17 NOTE — Telephone Encounter (Signed)
Patient called into report that her left arm is red and swollen. Patient reports that she got a pneumonia vaccine. Patient reports that the nurse "pinched her muscle" and is concerned that vaccine did not distrubitted as it should due to pinching of muscle.

## 2019-04-17 NOTE — Chronic Care Management (AMB) (Signed)
  Chronic Care Management   Note  04/17/2019 Name: Angela Young MRN: [id number] DOB: 06-01-1931  Angela Young is a 85 y.o. year old female who sees Bacigalupo, Dionne Bucy, MD for primary care. Dr. Brita Romp asked the CCM team to consult the patient for assistance with chronic disease management related to needs for hearing aids, glasses, and dental work. Referral was placed 04/13/19 Telephone outreach to patient today to introduce CCM services.   Plan: Angela Young agreed to services and verbal consent obtained. I have inbasketed LCSW Chrystal Land a copy of this note for outreach as Angela Young is anxious to discuss a private family issue in addition to her needs for hearing aids, glasses, and dental work.    Angela Young was given information about Chronic Care Management services today including:  1. CCM service includes personalized support from designated clinical staff supervised by her physician, including individualized plan of care and coordination with other care providers 2. 24/7 contact phone numbers for assistance for urgent and routine care needs. 3. Service will only be billed when office clinical staff spend 20 minutes or more in a month to coordinate care. 4. Only one practitioner may furnish and bill the service in a calendar month. 5. The patient may stop CCM services at any time (effective at the end of the month) by phone call to the office staff. 6. The patient will be responsible for cost sharing (co-pay) of up to 20% of the service fee (after annual deductible is met).  Patient agreed to services and verbal consent obtained.     Ruben Reason, PharmD Clinical Pharmacist [place] [phone]

## 2019-04-17 NOTE — Telephone Encounter (Signed)
Patient reports that her arm is not red or swollen. She reports that is only sore where the "woman" pinched the muscle and that she only wants the doctor to know that and she doesn't want that woman to ever give a shot. Advised patient that if it was sore to applied ice to help the soreness and that sometimes vaccines do cause soreness. She said she hasn't had this happened to her before.

## 2019-04-19 NOTE — Telephone Encounter (Signed)
Noted, thanks!

## 2019-05-02 ENCOUNTER — Ambulatory Visit (INDEPENDENT_AMBULATORY_CARE_PROVIDER_SITE_OTHER): Payer: Medicare Other | Admitting: *Deleted

## 2019-05-02 DIAGNOSIS — F331 Major depressive disorder, recurrent, moderate: Secondary | ICD-10-CM

## 2019-05-02 DIAGNOSIS — G894 Chronic pain syndrome: Secondary | ICD-10-CM

## 2019-05-02 NOTE — Patient Instructions (Signed)
Thank you allowing the Chronic Care Management Team to be a part of your care! It was a pleasure speaking with you today!  1. Please call this social worker if there are any further questions regarding your community resource needs.     CCM (Chronic Care Management) Team   Trish Fountain RN, BSN Nurse Care Coordinator  [phone]  Ruben Reason PharmD  Clinical Pharmacist  [phone]   Elliot Gurney, LCSW Clinical Social Worker [phone]  Goals Addressed            This Visit's Progress   . "I need help with my daughter who is a Ship broker" (pt-stated)       Current Barriers:  . Family and relationship dysfunction  Clinical Social Work Clinical Goal(s):  Marland Kitchen Over the next 30 days, client will follow up with the Adult Abuse Hotline through the [place] [phone] and [facility] [phone] for assistance with hoarding and clutter clean up* as directed by SW  Interventions: . Patient interviewed and appropriate assessments performed . Provided mental health counseling with regard to daughter's hoarding, emphasizing that hoarding is a mental health issue and assistance is most effective when help is accepted voluntarily (mental health diagnosis or concern) . Provided patient with information about filing a report with Adult Protective Services if there are thoughts that patient's daughter's safety is in jeopardy. Contact number for [employer] also provided to assist with Clutter clean up  . Advised patient to to discuss concerns about  her daughter's hoarding with her daughter and encourage her to follow up with her local mental health provider.  . Phone number also provided for lawn care as requested by patient Coach and Son's [phone]  Patient Self Care Activities:  . Performs ADL's independently . Performs IADL's independently . Calls provider office for new concerns or questions  Initial goal documentation         The patient  verbalized understanding of instructions provided today and declined a print copy of patient instruction materials.   No further follow up required: patient to follow up with adult protective services and/or [employer] if needed for assistance with daughter's hoarding behaviors

## 2019-05-02 NOTE — Chronic Care Management (AMB) (Signed)
  Chronic Care Management    Clinical Social Work Follow Up Note  05/02/2019 Name: Angela Young MRN: [id number] DOB: May 20, 1931  Angela Young is a 85 y.o. year old female who is a primary care patient of Bacigalupo, Dionne Bucy, MD. The CCM team was consulted for assistance with [employer].   Review of patient status, including review of consultants reports, other relevant assessments, and collaboration with appropriate care team members and the patient's provider was performed as part of comprehensive patient evaluation and provision of chronic care management services.     Goals Addressed            This Visit's Progress   . "I need help with my daughter who is a Ship broker" (pt-stated)       Current Barriers:  . Family and relationship dysfunction  Clinical Social Work Clinical Goal(s):  Marland Kitchen Over the next 30 days, client will follow up with the Adult Abuse Hotline through the [place] [phone] and [facility] [phone] for assistance with hoarding and clutter clean up* as directed by SW  Interventions: . Patient interviewed and appropriate assessments performed . Provided mental health counseling with regard to daughter's hoarding, emphasizing that hoarding is a mental health issue and assistance is most effective when help is accepted voluntarily (mental health diagnosis or concern) . Provided patient with information about filing a report with Adult Protective Services if there are thoughts that patient's daughter's safety is in jeopardy. Contact number for [employer] also provided to assist with Clutter clean up  . Advised patient to to discuss concerns about  her daughter's hoarding with her daughter and encourage her to follow up with her local mental health provider.  . Phone number also provided for lawn care as requested by patient Coach and Son's [phone]  Patient Self Care Activities:  . Performs ADL's independently . Performs IADL's  independently . Calls provider office for new concerns or questions  Initial goal documentation         Follow Up Plan: Client will follow up with Adult Protective Services and/or [employer] for clean up support  to assist her daughter with hoarding behaviors as needed.    Elliot Gurney, [place] Worker  [facility] Care Management [phone]

## 2019-05-18 ENCOUNTER — Other Ambulatory Visit: Payer: Self-pay

## 2019-05-18 DIAGNOSIS — M1990 Unspecified osteoarthritis, unspecified site: Secondary | ICD-10-CM

## 2019-05-18 MED ORDER — OXYCODONE HCL 10 MG PO TABS: ORAL_TABLET | ORAL | 0 refills | Status: DC

## 2019-05-18 NOTE — Telephone Encounter (Signed)
Tried calling; no answer.   Thanks,   -Byrd Terrero  

## 2019-05-18 NOTE — Telephone Encounter (Signed)
Patient is requesting a refill of Oxycodone.

## 2019-05-18 NOTE — Telephone Encounter (Signed)
One month supply will be sent to pharmacy.  Patient will be due for chronic pain f/u appt prior to next refill.  This should be scheduled now.  As discussed with patient at last visit, plan to taper Oxycodone as she is on a high dose.

## 2019-05-25 NOTE — Telephone Encounter (Signed)
Tried calling patient --no answer

## 2019-05-26 ENCOUNTER — Ambulatory Visit: Payer: Medicare Other | Admitting: Family Medicine

## 2019-06-20 ENCOUNTER — Other Ambulatory Visit: Payer: Self-pay | Admitting: Family Medicine

## 2019-06-20 DIAGNOSIS — M1990 Unspecified osteoarthritis, unspecified site: Secondary | ICD-10-CM

## 2019-06-20 MED ORDER — OXYCODONE HCL 10 MG PO TABS: ORAL_TABLET | ORAL | 0 refills | Status: DC

## 2019-06-20 NOTE — Telephone Encounter (Signed)
Pt needing a refill on:  Oxycodone HCl 10 MG TABS   Please fill at:  [facility] [address] [place] - [place] [phone] (Phone) [phone] (Fax)   Thanks, [employer]

## 2019-06-27 DIAGNOSIS — H2513 Age-related nuclear cataract, bilateral: Secondary | ICD-10-CM | POA: Diagnosis not present

## 2019-07-19 ENCOUNTER — Other Ambulatory Visit: Payer: Self-pay | Admitting: Family Medicine

## 2019-07-19 DIAGNOSIS — M1990 Unspecified osteoarthritis, unspecified site: Secondary | ICD-10-CM

## 2019-07-19 NOTE — Telephone Encounter (Signed)
Pt needs a refill on her oxycodone 10 mg  [facility] [place]  teri

## 2019-07-20 MED ORDER — OXYCODONE HCL 10 MG PO TABS: ORAL_TABLET | ORAL | 0 refills | Status: DC

## 2019-08-18 ENCOUNTER — Other Ambulatory Visit: Payer: Self-pay | Admitting: Family Medicine

## 2019-08-18 DIAGNOSIS — M1990 Unspecified osteoarthritis, unspecified site: Secondary | ICD-10-CM

## 2019-08-18 MED ORDER — OXYCODONE HCL 10 MG PO TABS: ORAL_TABLET | ORAL | 0 refills | Status: DC

## 2019-08-18 NOTE — Telephone Encounter (Signed)
Patient was advised and states that she will call back to schedule appointment after her surgery.

## 2019-08-18 NOTE — Telephone Encounter (Signed)
L.O.V. was 04/13/2019, please advise. 

## 2019-08-18 NOTE — Telephone Encounter (Signed)
Pt needing refill on:  Oxycodone HCl 10 MG TABS - wants to pick it up at the 1st of the week.  Please call into:   [facility] [address], [place] [phone]) [phone] (Fax)   Thanks, [facility]

## 2019-08-18 NOTE — Telephone Encounter (Signed)
Needs follow-up visit, can be virtual, before next refill

## 2019-08-21 DIAGNOSIS — H2511 Age-related nuclear cataract, right eye: Secondary | ICD-10-CM | POA: Diagnosis not present

## 2019-08-21 DIAGNOSIS — H25013 Cortical age-related cataract, bilateral: Secondary | ICD-10-CM | POA: Diagnosis not present

## 2019-08-21 DIAGNOSIS — H2513 Age-related nuclear cataract, bilateral: Secondary | ICD-10-CM | POA: Diagnosis not present

## 2019-08-21 DIAGNOSIS — H16223 Keratoconjunctivitis sicca, not specified as Sjogren's, bilateral: Secondary | ICD-10-CM | POA: Diagnosis not present

## 2019-08-21 DIAGNOSIS — H25043 Posterior subcapsular polar age-related cataract, bilateral: Secondary | ICD-10-CM | POA: Diagnosis not present

## 2019-09-06 ENCOUNTER — Ambulatory Visit: Payer: Medicare Other | Admitting: Family Medicine

## 2019-09-06 NOTE — Progress Notes (Signed)
Patient: Angela Young Female    DOB: 03-04-1931   85 y.o.   MRN: [id number] Visit Date: 09/07/2019  Today's Provider: Lavon Paganini, MD   Chief Complaint  Patient presents with  . Sore Throat  . Follow-up   Subjective:    Virtual Visit via Telephone Note  I connected with Angela Young on 09/07/19 at 10:40 AM EST by telephone and verified that I am speaking with the correct person using two identifiers.   Patient location: home Provider location: [place] involved in the visit: patient, provider   I discussed the limitations, risks, security and privacy concerns of performing an evaluation and management service by telephone and the availability of in person appointments. I also discussed with the patient that there may be a patient responsible charge related to this service. The patient expressed understanding and agreed to proceed.  HPI Patient c/o sore throat x's 7-10 days. Patient reports breath smell bad, food don't taste right. Patient reports no pain with swallowing. Patient denies any fever, cough or runny nose. Patient reports she has not taken anything for pain.  Reports this is a mild sore throat.  States that she typically gets this around this time of year.  She is not taking anything for allergies, but knows that she has allergies.  Patient reports she needs refill of oxycodone soon.  She states that she tried multiple medication options in the past with pain management at [facility] and did not find any relief or other things that did not give her side effects.  She states she feels she is no longer well controlled on the oxycodone.  She tried Cymbalta but felt it made her too drowsy so she stopped taking it after 2 doses.  She states that her granddaughter is taking Pristiq and she would rather take this for her depression.  Allergies  Allergen Reactions  . Ether      Current Outpatient Medications:  .  caffeine 200 MG TABS tablet,  Take 200 mg by mouth every 4 (four) hours as needed., Disp: , Rfl:  .  Cholecalciferol (VITAMIN D) 2000 UNITS CAPS, Take by mouth., Disp: , Rfl:  .  Cranberry 1000 MG CAPS, Take by mouth. occsaionally, Disp: , Rfl:  .  [place] Ginseng 100 MG CAPS, Take 2 capsules by mouth daily., Disp: , Rfl:  .  Magnesium 250 MG TABS, Take 250 mg by mouth at bedtime. As needed, Disp: , Rfl:  .  magnesium 30 MG tablet, Take 30 mg by mouth 2 (two) times daily., Disp: , Rfl:  .  Multiple Vitamins-Minerals (ENERGY BOOSTER) PACK, Take by mouth. occasionally, Disp: , Rfl:  .  Oxycodone HCl 10 MG TABS, One tablet every 4-6 hours, up to 5 tablets a day, Disp: 150 tablet, Rfl: 0  Review of Systems  Constitutional: Negative.   HENT: Positive for congestion, postnasal drip, rhinorrhea, sneezing and sore throat. Negative for ear discharge, ear pain, facial swelling, hearing loss, sinus pressure and sinus pain.   Respiratory: Negative.   Cardiovascular: Negative.   Genitourinary: Negative.   Musculoskeletal: Positive for arthralgias and back pain. Negative for joint swelling.  Skin: Negative.   Neurological: Negative.   Psychiatric/Behavioral: Positive for decreased concentration and dysphoric mood.    Social History   Tobacco Use  . Smoking status: Former Smoker    Types: Cigarettes  . Smokeless tobacco: Never Used  . Tobacco comment: >15 years ago  Substance Use Topics  .  Alcohol use: No      Objective:   There were no vitals taken for this visit. There were no vitals filed for this visit.There is no height or weight on file to calculate BMI.   Physical Exam Speaks in full sentences in no acute distress  No results found for any visits on 09/07/19.     Assessment & Plan    I discussed the assessment and treatment plan with the patient. The patient was provided an opportunity to ask questions and all were answered. The patient agreed with the plan and demonstrated an understanding of the  instructions.   The patient was advised to call back or seek an in-person evaluation if the symptoms worsen or if the condition fails to improve as anticipated.  I provided 25 minutes of non-face-to-face time during this encounter.  Problem List Items Addressed This Visit      Respiratory   Allergic rhinitis - Primary    Chronic issue Advised her to take OTC antihistamine and try Flonase Suspect that postnasal drip is contributing to her sore throat, but discussed with the patient that I cannot rule out COVID-19 infection, especially as she is high risk Advised her to go for outpatient testing and to self isolate pending results Discussed symptomatic management, return precautions        Musculoskeletal and Integument   Chronic osteoarthritis    As below, patient with chronic pain syndrome related to chronic osteoarthritis She is status post multiple joint replacements As below, we will continue her chronic pain medication, but believes that she needs to see pain management as she is not having relief from high-dose oxycodone at this time       Relevant Orders   Ambulatory referral to Pain Clinic     Other   MDD (major depressive disorder)    Recently diagnosed about 5 months ago This is worsened by stress of her son being at a care facility during a COVID-19 outbreak Also worsened by her chronic pain this uncontrolled She tried Cymbalta but only for 2 doses and states she did not tolerate this We will try low-dose Effexor at bedtime to see if this can help Contracted for safety, no SI/HI Discussed that it can take 6 to 8 weeks to reach full efficacy Discussed potential side effects Has previously been referred to CCM Repeat PHQ-9 and GAD-7 at follow-up and consider dose titration      Relevant Medications   venlafaxine (EFFEXOR) 25 MG tablet   Chronic pain syndrome    Chronic and uncontrolled Related to osteoarthritis of multiple joints She has been taking oxycodone 10  mg every 4-6 hours for many years We have discussed chronic narcotic therapy and how this can actually lead to hyperalgesia We tried Cymbalta, but she did not tolerate Discussed with patient that she is already on a high dose of oxycodone M we would not increase dose at this time Referral to pain management for further eval and management      Relevant Medications   venlafaxine (EFFEXOR) 25 MG tablet   Other Relevant Orders   Ambulatory referral to Pain Clinic    Other Visit Diagnoses    Sore throat       Relevant Orders   Novel Coronavirus, NAA ([facility])       The entirety of the information documented in the History of Present Illness, Review of Systems and Physical Exam were personally obtained by me. Portions of this information were initially documented by BelizeSulibeya  Dimas, CMA and reviewed by me for thoroughness and accuracy.    , Marzella Schlein, MD MPH [facility]

## 2019-09-07 ENCOUNTER — Ambulatory Visit (INDEPENDENT_AMBULATORY_CARE_PROVIDER_SITE_OTHER): Payer: Medicare Other | Admitting: Family Medicine

## 2019-09-07 DIAGNOSIS — J301 Allergic rhinitis due to pollen: Secondary | ICD-10-CM | POA: Diagnosis not present

## 2019-09-07 DIAGNOSIS — F331 Major depressive disorder, recurrent, moderate: Secondary | ICD-10-CM

## 2019-09-07 DIAGNOSIS — M1990 Unspecified osteoarthritis, unspecified site: Secondary | ICD-10-CM | POA: Diagnosis not present

## 2019-09-07 DIAGNOSIS — G894 Chronic pain syndrome: Secondary | ICD-10-CM | POA: Diagnosis not present

## 2019-09-07 DIAGNOSIS — J029 Acute pharyngitis, unspecified: Secondary | ICD-10-CM | POA: Diagnosis not present

## 2019-09-07 MED ORDER — VENLAFAXINE HCL 25 MG PO TABS: 25.0000 mg | ORAL_TABLET | Freq: Every day | ORAL | 1 refills | Status: DC

## 2019-09-08 NOTE — Assessment & Plan Note (Signed)
Chronic and uncontrolled Related to osteoarthritis of multiple joints She has been taking oxycodone 10 mg every 4-6 hours for many years We have discussed chronic narcotic therapy and how this can actually lead to hyperalgesia We tried Cymbalta, but she did not tolerate Discussed with patient that she is already on a high dose of oxycodone M we would not increase dose at this time Referral to pain management for further eval and management

## 2019-09-08 NOTE — Assessment & Plan Note (Signed)
As below, patient with chronic pain syndrome related to chronic osteoarthritis She is status post multiple joint replacements As below, we will continue her chronic pain medication, but believes that she needs to see pain management as she is not having relief from high-dose oxycodone at this time

## 2019-09-08 NOTE — Assessment & Plan Note (Signed)
Chronic issue Advised her to take OTC antihistamine and try Flonase Suspect that postnasal drip is contributing to her sore throat, but discussed with the patient that I cannot rule out COVID-19 infection, especially as she is high risk Advised her to go for outpatient testing and to self isolate pending results Discussed symptomatic management, return precautions

## 2019-09-08 NOTE — Assessment & Plan Note (Signed)
Recently diagnosed about 5 months ago This is worsened by stress of her son being at a care facility during a COVID-19 outbreak Also worsened by her chronic pain this uncontrolled She tried Cymbalta but only for 2 doses and states she did not tolerate this We will try low-dose Effexor at bedtime to see if this can help Contracted for safety, no SI/HI Discussed that it can take 6 to 8 weeks to reach full efficacy Discussed potential side effects Has previously been referred to CCM Repeat PHQ-9 and GAD-7 at follow-up and consider dose titration

## 2019-09-18 ENCOUNTER — Other Ambulatory Visit: Payer: Self-pay

## 2019-09-18 DIAGNOSIS — M1990 Unspecified osteoarthritis, unspecified site: Secondary | ICD-10-CM

## 2019-09-18 MED ORDER — OXYCODONE HCL 10 MG PO TABS: ORAL_TABLET | ORAL | 0 refills | Status: DC

## 2019-09-18 NOTE — Telephone Encounter (Signed)
L.O.V. was on 09/07/2019. 

## 2019-09-19 DIAGNOSIS — H25042 Posterior subcapsular polar age-related cataract, left eye: Secondary | ICD-10-CM | POA: Diagnosis not present

## 2019-09-19 DIAGNOSIS — H25012 Cortical age-related cataract, left eye: Secondary | ICD-10-CM | POA: Diagnosis not present

## 2019-09-19 DIAGNOSIS — H2512 Age-related nuclear cataract, left eye: Secondary | ICD-10-CM | POA: Diagnosis not present

## 2019-09-19 DIAGNOSIS — H2511 Age-related nuclear cataract, right eye: Secondary | ICD-10-CM | POA: Diagnosis not present

## 2019-09-19 DIAGNOSIS — H25811 Combined forms of age-related cataract, right eye: Secondary | ICD-10-CM | POA: Diagnosis not present

## 2019-10-19 ENCOUNTER — Other Ambulatory Visit: Payer: Self-pay | Admitting: Family Medicine

## 2019-10-19 DIAGNOSIS — M1990 Unspecified osteoarthritis, unspecified site: Secondary | ICD-10-CM

## 2019-10-19 MED ORDER — OXYCODONE HCL 10 MG PO TABS: ORAL_TABLET | ORAL | 0 refills | Status: DC

## 2019-10-19 NOTE — Telephone Encounter (Signed)
Has she seen pain management yet?  She needs to see them.  One month supply given.

## 2019-10-19 NOTE — Telephone Encounter (Signed)
Patient advised as below. Appt with pain management 11/21/2019

## 2019-10-19 NOTE — Telephone Encounter (Signed)
Requested medication (s) are due for refill today: yes  Requested medication (s) are on the active medication list: yes  Last refill:  09/18/2019  Future visit scheduled: yes  Notes to clinic: refill cannot be delegated    Requested Prescriptions  Pending Prescriptions Disp Refills   Oxycodone HCl 10 MG TABS 150 tablet 0    Sig: One tablet every 4-6 hours, up to 5 tablets a day      Not Delegated - Analgesics:  Opioid Agonists Failed - 10/19/2019 11:15 AM      Failed - This refill cannot be delegated      Failed - Urine Drug Screen completed in last 360 days.      Passed - Valid encounter within last 6 months    Recent Outpatient Visits           1 month ago Seasonal allergic rhinitis due to pollen   [facility], Dionne Bucy, MD   6 months ago Encounter for annual physical exam   [facility] [place], Dionne Bucy, MD   1 year ago Annual physical exam   [facility] Birdie Sons, MD   2 years ago Primary osteoarthritis of both hands   [facility] Birdie Sons, MD   2 years ago Wellness examination   [facility] Birdie Sons, MD       Future Appointments             In 6 months  [facility], [place]   In 6 months [place], Dionne Bucy, MD [facility], [place]

## 2019-10-19 NOTE — Telephone Encounter (Signed)
Medication refill: Oxycodone HCl 10 MG TABS [[id number]]     Pharmacy:  [place], [place] [place] Phone:  [phone]  Fax:  [phone]       Pt aware of turn around time

## 2019-11-20 ENCOUNTER — Encounter: Payer: Self-pay | Admitting: Student in an Organized Health Care Education/Training Program

## 2019-11-20 NOTE — Progress Notes (Signed)
Patient: Angela Young  Service Category: E/M  Provider: Edward Jolly, MD  DOB: [birth year]  DOS: 11/21/2019  Location: Office  MRN: [id number]  Setting: Ambulatory outpatient  Referring Provider: Erasmo Downer, MD  Type: New Patient  Specialty: Interventional Pain Management  PCP: Erasmo Downer, MD  Location: Home  Delivery: TeleHealth     Virtual Encounter - Pain Management PROVIDER NOTE: Information contained herein reflects review and annotations entered in association with encounter. Interpretation of such information and data should be left to medically-trained personnel. Information provided to patient can be located elsewhere in the medical record under "Patient Instructions". Document created using STT-dictation technology, any transcriptional errors that may result from process are unintentional.    Contact & Pharmacy Preferred: [phone] Home: [phone] (home) Mobile: There is no such number on file (mobile). E-mail: No e-mail address on record  [id number] - [place], [place] - [address] [place] [address] Phone: [phone] Fax: [phone]  [facility] - [place], [place] - [address] [place] [address] Phone: [phone] Fax: [phone]   Pre-screening note:  Our staff contacted Angela Young and offered her an "in person", "face-to-face" appointment versus a telephone encounter. She indicated preferring the telephone encounter, at this time.  Primary Reason(s) for Visit: Tele-Encounter for initial evaluation of one or more chronic problems (new to examiner) potentially causing chronic pain, and posing a threat to normal musculoskeletal function. (Level of risk: High) CC: chronic hand pain   I contacted Angela Young on 11/21/2019 via telephone.      I clearly identified myself as Edward Jolly, MD. I verified that I was speaking with the correct person using two identifiers (Name: Angela Young,  and date of birth: 05-Nov-1930).  This visit was completed via telephone due to the restrictions of the COVID-19 pandemic. All issues as above were discussed and addressed but no physical exam was performed. If it was felt that the patient should be evaluated in the office, they were directed there. The patient verbally consented to this visit. Patient was unable to complete an audio/visual visit due to Technical difficulties and/or Lack of internet. Due to the catastrophic nature of the COVID-19 pandemic, this visit was done through audio contact only.  Location of the patient: home address (see Epic for details)  Location of the provider: office  Advanced Informed Consent I sought verbal advanced consent from Angela Young for virtual visit interactions. I informed Angela Young of possible security and privacy concerns, risks, and limitations associated with providing "not-in-person" medical evaluation and management services. I also informed Angela Young of the availability of "in-person" appointments. Finally, I informed her that there would be a charge for the virtual visit and that she could be  personally, fully or partially, financially responsible for it. Angela Young expressed understanding and agreed to proceed.   HPI  Angela Young is a 86 y.o. year old, female patient, contacted today for an initial evaluation of her chronic pain. She has Chronic osteoarthritis; Abnormal ECG; Allergic rhinitis; Benign cyst of breast; Body mass index (BMI) of 26.0-26.9 in adult; CFIDS (chronic fatigue and immune dysfunction syndrome) (HCC); MDD (major depressive disorder); Bladder cystocele; Acid reflux; Bergmann's syndrome; History of polycythemia; Spondylosis without myelopathy; Vitamin D deficiency; Dermatitis; Paresthesia; Anemia; Osteoarthritis of both hands; Chronic pain syndrome; Hard of hearing; Bilateral primary osteoarthritis of knee (left knee replacement); History of left knee replacement; History  of bilateral  hip replacements; Opioid type dependence, continuous (HCC); and Encounter for long-term opiate analgesic use on their problem list.   Onset and Duration: Gradual and Date of onset: more than 30 years Cause of pain: Unknown Severity: Getting worse, NAS-11 at its worse: 10/10, NAS-11 at its best: 5/10, NAS-11 now: 8/10 and NAS-11 on the average: 8/10 Timing: Not influenced by the time of the day Aggravating Factors: Bending, Lifiting, Motion, Prolonged sitting, Prolonged standing and Walking Alleviating Factors: Hot packs, Lying down, Medications and Warm showers or baths Associated Problems: Constipation, Depression, Fatigue, Numbness, Weakness, Pain that wakes patient up and Pain that does not allow patient to sleep Quality of Pain: Aching, Burning, Nagging, Throbbing and Tingling Previous Examinations or Tests: Bone scan, CT scan, MRI scan and X-rays Previous Treatments: Epidural steroid injections, Narcotic medications and Steroid treatments by mouth  Referral to pain clinic from PCP to consider continuation of opioid analgesics. Has been on chronic opoid therapy for over 10 years. Has wide spread osteoarthritis. History of knee replacement (left, more than 10 years ago) for left knee OA, bilateral hip replacement (a few years ago). Also bilateral hand pain, worse after working in her yard and outdoor chores. Has tried Oxycodone at [facility] but this was sub-optimal for her pain management. Has tried various NSAIDs which were not effective. Denies having tried Gabapentin gabapentin or Lyrica.  Has tried Cymbalta in the past which was not effective.  Has also tried Effexor in the past which was not effective.  She states that the oxycodone also helps out with her neuropathic pain in her hands.  Of note, patient was seen at [facility] pain clinic many years ago.  She states that she tried various medication trials and that the only opioid analgesic that did help her was oxycodone.  She has tried dose reduction in  the past which did not work for her as it increased her pain.  She has tried epidural steroid injections in the past which provided short-term temporary pain relief.  She is not interested in repeating these at the time.  Historic Controlled Substance Pharmacotherapy Review  Current opioid analgesics: 10/19/2019  1   10/19/2019  Oxycodone Hcl 10 MG Tablet  150.00  30 An Bac   [id number]   [facility])   0  75.00 MME  Medicare   [place]    Historical Monitoring: The patient  reports no history of drug use. List of all UDS Test(s): No results found for: MDMA, COCAINSCRNUR, PCPSCRNUR, PCPQUANT, CANNABQUANT, THCU, ETH List of other Serum/Urine Drug Screening Test(s):  No results found for: AMPHSCRSER, BARBSCRSER, BENZOSCRSER, COCAINSCRSER, COCAINSCRNUR, PCPSCRSER, PCPQUANT, THCSCRSER, THCU, CANNABQUANT, OPIATESCRSER, OXYSCRSER, PROPOXSCRSER, ETH Historical Background Evaluation: [place] PMP: PDMP reviewed during this encounter. Two (2) year initial data search conducted.             Risk Assessment Profile: PMP NARX Overdose Risk Score: 310  Pharmacologic Plan: As per protocol, I have not taken over any controlled substance management, pending the results of ordered tests and/or consults.            Initial impression: Pending review of available data and ordered tests.  Meds   Current Outpatient Medications:  .  Apoaequorin (PREVAGEN) 10 MG CAPS, Take 10 mg by mouth every morning., Disp: , Rfl:  .  caffeine 200 MG TABS tablet, Take 200 mg by mouth every 4 (four) hours as needed., Disp: , Rfl:  .  Cholecalciferol (VITAMIN D) 2000 UNITS CAPS, Take by mouth.,  Disp: , Rfl:  .  Cranberry 1000 MG CAPS, Take by mouth. occsaionally, Disp: , Rfl:  .  folic acid (FOLVITE) 267 MCG tablet, Take 400 mcg by mouth daily., Disp: , Rfl:  .  [place] Ginseng 100 MG CAPS, Take 2 capsules by mouth daily., Disp: , Rfl:  .  Magnesium 250 MG TABS, Take 250 mg by mouth at bedtime. As needed, Disp: , Rfl:  .  Multiple  Vitamins-Minerals (ENERGY BOOSTER) PACK, Take by mouth. occasionally, Disp: , Rfl:  .  Oxycodone HCl 10 MG TABS, One tablet every 4-6 hours, up to 5 tablets a day, Disp: 150 tablet, Rfl: 0 .  magnesium 30 MG tablet, Take 30 mg by mouth 2 (two) times daily., Disp: , Rfl:   ROS  Cardiovascular: No reported cardiovascular signs or symptoms such as High blood pressure, coronary artery disease, abnormal heart rate or rhythm, heart attack, blood thinner therapy or heart weakness and/or failure Pulmonary or Respiratory: No reported pulmonary signs or symptoms such as wheezing and difficulty taking a deep full breath (Asthma), difficulty blowing air out (Emphysema), coughing up mucus (Bronchitis), persistent dry cough, or temporary stoppage of breathing during sleep Neurological: No reported neurological signs or symptoms such as seizures, abnormal skin sensations, urinary and/or fecal incontinence, being born with an abnormal open spine and/or a tethered spinal cord Psychological-Psychiatric: Anxiousness and Prone to panicking Gastrointestinal: Reflux or heatburn, Alternating episodes iof diarrhea and constipation (IBS-Irritable bowe syndrome) and Irregular, infrequent bowel movements (Constipation) Genitourinary: No reported renal or genitourinary signs or symptoms such as difficulty voiding or producing urine, peeing blood, non-functioning kidney, kidney stones, difficulty emptying the bladder, difficulty controlling the flow of urine, or chronic kidney disease Hematological: No reported hematological signs or symptoms such as prolonged bleeding, low or poor functioning platelets, bruising or bleeding easily, hereditary bleeding problems, low energy levels due to low hemoglobin or being anemic Endocrine: No reported endocrine signs or symptoms such as high or low blood sugar, rapid heart rate due to high thyroid levels, obesity or weight gain due to slow thyroid or thyroid disease Rheumatologic: Joint aches  and or swelling due to excess weight (Osteoarthritis) and Generalized muscle aches (Fibromyalgia) Musculoskeletal: Negative for myasthenia gravis, muscular dystrophy, multiple sclerosis or malignant hyperthermia Work History: Retired  Allergies  Angela Young is allergic to ether.  Laboratory Chemistry Profile   Screening Lab Results  Component Value Date   STAPHAUREUS POSITIVE (A) 05/01/2011   MRSAPCR NEGATIVE 05/01/2011    Renal Lab Results  Component Value Date   BUN 20 04/13/2019   CREATININE 0.91 04/13/2019   BCR 22 04/13/2019   GFRAA 67 04/13/2019   GFRNONAA 58 (L) 04/13/2019                             Hepatic Lab Results  Component Value Date   AST 23 04/13/2019   ALT 20 04/13/2019   ALBUMIN 4.2 04/13/2019   ALKPHOS 66 04/13/2019                        Electrolytes Lab Results  Component Value Date   NA 140 04/13/2019   K 4.1 04/13/2019   CL 105 04/13/2019   CALCIUM 9.2 04/13/2019   PHOS 3.6 04/07/2017                        Neuropathy Lab Results  Component Value Date  VITAMINB12 580 12/13/2015   FOLATE >20.0 04/07/2017                        Bone Lab Results  Component Value Date   VD25OH 17.2 (L) 04/08/2018                         Coagulation Lab Results  Component Value Date   INR 1.06 05/01/2011   LABPROT 14.0 05/01/2011   APTT 32 05/01/2011   PLT 147 (L) 04/13/2019                        Cardiovascular Lab Results  Component Value Date   HGB 14.7 04/13/2019   HCT 43.4 04/13/2019                         ID Lab Results  Component Value Date   STAPHAUREUS POSITIVE (A) 05/01/2011   MRSAPCR NEGATIVE 05/01/2011    Endocrine Lab Results  Component Value Date   TSH 0.955 04/13/2019                        Note: Lab results reviewed.  Imaging Review    PFSH  Drug: Angela Young  reports no history of drug use. Alcohol:  reports no history of alcohol use. Tobacco:  reports that she has quit smoking. Her smoking use included  cigarettes. She has never used smokeless tobacco. Medical:  has a past medical history of Depression. Family: family history includes Anemia in her mother; Arthritis in her brother, mother, and sister; Heart disease in her sister; Stroke in her mother.  Past Surgical History:  Procedure Laterality Date  . ABDOMINAL HYSTERECTOMY    . BREAST LUMPECTOMY    . EYE SURGERY     due to trauma  . HIP SURGERY Right 05/13/11    total hip replacement dr Despina HickAlusio; left 07/11/08 Dr Ernest Pinehooten  . KNEE SURGERY Left 10/10  . resection of right carotid tumor     benign per pt   Active Ambulatory Problems    Diagnosis Date Noted  . Chronic osteoarthritis 04/30/2015  . Abnormal ECG 09/09/2015  . Allergic rhinitis 09/09/2015  . Benign cyst of breast 09/09/2015  . Body mass index (BMI) of 26.0-26.9 in adult 09/09/2015  . CFIDS (chronic fatigue and immune dysfunction syndrome) (HCC) 09/09/2015  . MDD (major depressive disorder) 09/09/2015  . Bladder cystocele 09/09/2015  . Acid reflux 09/09/2015  . 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 History of polycythemia 09/09/2015  . Spondylosis without myelopathy 05/11/2006  . Vitamin D deficiency 09/09/2015  . Dermatitis 09/09/2015  . Paresthesia 12/13/2015  . Anemia 04/07/2017  . Osteoarthritis of both hands 10/01/2017  . Chronic pain syndrome 04/13/2019  . Hard of hearing 04/13/2019  . Bilateral primary osteoarthritis of knee (left knee replacement) 11/21/2019  . History of left knee replacement 11/21/2019  . History of bilateral hip replacements 11/21/2019  . Opioid type dependence, continuous (HCC) 11/21/2019  . Encounter for long-term opiate analgesic use 11/21/2019   Resolved Ambulatory Problems    Diagnosis Date Noted  . No Resolved Ambulatory Problems   Past Medical History:  Diagnosis Date  . Depression    Assessment  Primary Diagnosis & Pertinent Problem List: The primary encounter diagnosis was Chronic pain syndrome. Diagnoses of CFIDS (chronic  fatigue and immune dysfunction syndrome) (HCC), Chronic osteoarthritis, Moderate episode  of recurrent major depressive disorder (HCC), Paresthesia, Spondylosis without myelopathy, Bilateral primary osteoarthritis of knee (left knee replacement), History of left knee replacement, History of bilateral hip replacements, Opioid type dependence, continuous (HCC), and Encounter for long-term opiate analgesic use were also pertinent to this visit.  Visit Diagnosis (New problems to examiner): 1. Chronic pain syndrome   2. CFIDS (chronic fatigue and immune dysfunction syndrome) (HCC)   3. Chronic osteoarthritis   4. Moderate episode of recurrent major depressive disorder (HCC)   5. Paresthesia   6. Spondylosis without myelopathy   7. Bilateral primary osteoarthritis of knee (left knee replacement)   8. History of left knee replacement   9. History of bilateral hip replacements   10. Opioid type dependence, continuous (HCC)   11. Encounter for long-term opiate analgesic use     General Recommendations: The pain condition that the patient suffers from is best treated with a multidisciplinary approach that involves an increase in physical activity to prevent de-conditioning and worsening of the pain cycle, as well as psychological counseling (formal and/or informal) to address the co-morbid psychological affects of pain. Treatment will often involve judicious use of pain medications and interventional procedures to decrease the pain, allowing the patient to participate in the physical activity that will ultimately produce long-lasting pain reductions. The goal of the multidisciplinary approach is to return the patient to a higher level of overall function and to restore their ability to perform activities of daily living.  Plan of Care (Initial workup plan)  Note: Ms. Justen was reminded that as per protocol, today's visit has been an evaluation only. We have not taken over the patient's controlled substance  management.  Long term opioid therapy, referred for management of chronic opioid analgesics. Oxycodone 10 mg #150/month; max 5/day. Extensive discussion with patient regarding goals of pain management, opioid tolerance, dependence, and coping skills. Can consider Gabapentin/Lyrica in future for neuropathic pain. Will obtain baseline UDS, if appropriate, 2nd visit to take over chronic opioid therapy and discuss addition of low dose Gabapentin or Lyrica for neuropathic pain. Also consider alpha-lipoic acid.    Lab Orders     Compliance Drug Analysis, Ur  Pharmacological management options:  Opioid Analgesics: The patient was informed that there is no guarantee that she would be a candidate for opioid analgesics. The decision will be made following CDC guidelines. This decision will be based on the results of diagnostic studies, as well as Ms. Oak's risk profile.   Membrane stabilizer: consider Gabapentin/Lyrica. Failed Cymbalta, effexor  Muscle relaxant: To be determined at a later time  NSAID: Tried and failed  Other analgesic(s): To be determined at a later time    Provider-requested follow-up: Return in about 16 days (around 12/07/2019) for Medication Management, virtual.  Future Appointments  Date Time Provider Department Center  04/17/2020  1:20 PM [facility]-NURSE HEALTH ADVISOR [facility]  04/17/2020  2:00 PM Bacigalupo, Marzella Schlein, MD [facility]    Total duration of non-face-to-face encounter: 33 minutes.  Primary Care Physician: Erasmo Downer, MD Location: [facility] Note by: Edward Jolly, MD Date: 11/21/2019; Time: 10:51 AM  Note: This dictation was prepared with Dragon dictation. Any transcriptional errors that may result from this process are unintentional.

## 2019-11-21 ENCOUNTER — Encounter: Payer: Self-pay | Admitting: Student in an Organized Health Care Education/Training Program

## 2019-11-21 ENCOUNTER — Other Ambulatory Visit: Payer: Self-pay

## 2019-11-21 ENCOUNTER — Other Ambulatory Visit: Payer: Self-pay | Admitting: Family Medicine

## 2019-11-21 ENCOUNTER — Ambulatory Visit
Payer: Medicare Other | Attending: Student in an Organized Health Care Education/Training Program | Admitting: Student in an Organized Health Care Education/Training Program

## 2019-11-21 ENCOUNTER — Telehealth: Payer: Self-pay | Admitting: *Deleted

## 2019-11-21 DIAGNOSIS — R5382 Chronic fatigue, unspecified: Secondary | ICD-10-CM

## 2019-11-21 DIAGNOSIS — F331 Major depressive disorder, recurrent, moderate: Secondary | ICD-10-CM

## 2019-11-21 DIAGNOSIS — D8989 Other specified disorders involving the immune mechanism, not elsewhere classified: Secondary | ICD-10-CM

## 2019-11-21 DIAGNOSIS — M1990 Unspecified osteoarthritis, unspecified site: Secondary | ICD-10-CM

## 2019-11-21 DIAGNOSIS — Z96652 Presence of left artificial knee joint: Secondary | ICD-10-CM | POA: Insufficient documentation

## 2019-11-21 DIAGNOSIS — F1120 Opioid dependence, uncomplicated: Secondary | ICD-10-CM | POA: Insufficient documentation

## 2019-11-21 DIAGNOSIS — Z79891 Long term (current) use of opiate analgesic: Secondary | ICD-10-CM | POA: Insufficient documentation

## 2019-11-21 DIAGNOSIS — G894 Chronic pain syndrome: Secondary | ICD-10-CM | POA: Diagnosis not present

## 2019-11-21 DIAGNOSIS — M170 Bilateral primary osteoarthritis of knee: Secondary | ICD-10-CM | POA: Insufficient documentation

## 2019-11-21 DIAGNOSIS — Z96643 Presence of artificial hip joint, bilateral: Secondary | ICD-10-CM | POA: Insufficient documentation

## 2019-11-21 DIAGNOSIS — M47819 Spondylosis without myelopathy or radiculopathy, site unspecified: Secondary | ICD-10-CM

## 2019-11-21 DIAGNOSIS — R202 Paresthesia of skin: Secondary | ICD-10-CM

## 2019-11-21 MED ORDER — OXYCODONE HCL 10 MG PO TABS: ORAL_TABLET | ORAL | 0 refills | Status: DC

## 2019-11-21 NOTE — Telephone Encounter (Signed)
Requested medication (s) are due for refill today: yes  Requested medication (s) are on the active medication list: yes  Last refill:  10/19/2019  Future visit scheduled: yes  Notes to clinic:  not delegated    Requested Prescriptions  Pending Prescriptions Disp Refills   Oxycodone HCl 10 MG TABS 150 tablet 0    Sig: One tablet every 4-6 hours, up to 5 tablets a day      Not Delegated - Analgesics:  Opioid Agonists Failed - 11/21/2019 11:22 AM      Failed - This refill cannot be delegated      Failed - Urine Drug Screen completed in last 360 days.      Passed - Valid encounter within last 6 months    Recent Outpatient Visits           2 months ago Seasonal allergic rhinitis due to pollen   [facility], Marzella Schlein, MD   7 months ago Encounter for annual physical exam   [facility], Marzella Schlein, MD   1 year ago Annual physical exam   [facility] Malva Limes, MD   2 years ago Primary osteoarthritis of both hands   [facility] Malva Limes, MD   2 years ago Wellness examination   [facility], Demetrios Isaacs, MD       Future Appointments             In 4 months  [facility], [facility]   In 4 months Bacigalupo, Marzella Schlein, MD [facility], [facility]

## 2019-11-21 NOTE — Telephone Encounter (Signed)
Copied from CRM [id number]. Topic: Quick Communication - Rx Refill/Question >> Nov 21, 2019 11:10 AM Dalphine Handing A wrote: Medication:Oxycodone HCl 10 MG TABS (Patient has 2 pills left. Patient would like medication called in until her pain management doctor takes over prescription in 2 weeks.)  Has the patient contacted their pharmacy? Yes (Agent: If no, request that the patient contact the pharmacy for the refill.) (Agent: If yes, when and what did the pharmacy advise?)Contact PCP  Preferred Pharmacy (with phone number or street name): [facility] - [place], [place] - [address]  Phone:  [phone] Fax:  [phone]     Agent: Please be advised that RX refills may take up to 3 business days. We ask that you follow-up with your pharmacy.

## 2019-11-29 DIAGNOSIS — G894 Chronic pain syndrome: Secondary | ICD-10-CM | POA: Diagnosis not present

## 2019-12-01 LAB — COMPLIANCE DRUG ANALYSIS, UR

## 2019-12-06 ENCOUNTER — Encounter: Payer: Self-pay | Admitting: Student in an Organized Health Care Education/Training Program

## 2019-12-07 ENCOUNTER — Other Ambulatory Visit: Payer: Self-pay

## 2019-12-07 ENCOUNTER — Ambulatory Visit
Payer: Medicare Other | Attending: Student in an Organized Health Care Education/Training Program | Admitting: Student in an Organized Health Care Education/Training Program

## 2019-12-07 ENCOUNTER — Encounter: Payer: Self-pay | Admitting: Student in an Organized Health Care Education/Training Program

## 2019-12-07 DIAGNOSIS — M1990 Unspecified osteoarthritis, unspecified site: Secondary | ICD-10-CM | POA: Diagnosis not present

## 2019-12-07 DIAGNOSIS — G894 Chronic pain syndrome: Secondary | ICD-10-CM | POA: Diagnosis not present

## 2019-12-07 DIAGNOSIS — D8989 Other specified disorders involving the immune mechanism, not elsewhere classified: Secondary | ICD-10-CM

## 2019-12-07 DIAGNOSIS — F331 Major depressive disorder, recurrent, moderate: Secondary | ICD-10-CM

## 2019-12-07 DIAGNOSIS — M170 Bilateral primary osteoarthritis of knee: Secondary | ICD-10-CM | POA: Diagnosis not present

## 2019-12-07 DIAGNOSIS — Z96652 Presence of left artificial knee joint: Secondary | ICD-10-CM

## 2019-12-07 DIAGNOSIS — G9332 Myalgic encephalomyelitis/chronic fatigue syndrome: Secondary | ICD-10-CM

## 2019-12-07 DIAGNOSIS — Z0289 Encounter for other administrative examinations: Secondary | ICD-10-CM | POA: Insufficient documentation

## 2019-12-07 DIAGNOSIS — R5382 Chronic fatigue, unspecified: Secondary | ICD-10-CM | POA: Diagnosis not present

## 2019-12-07 DIAGNOSIS — F1120 Opioid dependence, uncomplicated: Secondary | ICD-10-CM

## 2019-12-07 DIAGNOSIS — Z96643 Presence of artificial hip joint, bilateral: Secondary | ICD-10-CM

## 2019-12-07 MED ORDER — OXYCODONE HCL 10 MG PO TABS: ORAL_TABLET | ORAL | 0 refills | Status: DC

## 2019-12-07 NOTE — Progress Notes (Signed)
Patient: Angela Young  Service Category: E/M  Provider: Gillis Santa, MD  DOB: Dec 03, 1930  DOS: 12/07/2019  Location: Office  MRN: [id number]  Setting: Ambulatory outpatient  Referring Provider: Virginia Crews, MD  Type: Established Patient  Specialty: Interventional Pain Management  PCP: Virginia Crews, MD  Location: Home  Delivery: TeleHealth     Virtual Encounter - Pain Management PROVIDER NOTE: Information contained herein reflects review and annotations entered in association with encounter. Interpretation of such information and data should be left to medically-trained personnel. Information provided to patient can be located elsewhere in the medical record under "Patient Instructions". Document created using STT-dictation technology, any transcriptional errors that may result from process are unintentional.    Contact & Pharmacy Preferred: [phone] Home: [phone] (home) Mobile: There is no such number on file (mobile). E-mail: No e-mail address on record  [place], [place] - [place] [address] Phone: [phone] Fax: [place], [place] [address] Phone: [phone] Fax: [phone]   Pre-screening note:  Our staff contacted Angela Young and offered her an "in person", "face-to-face" appointment versus a telephone encounter. She indicated preferring the telephone encounter, at this time.   Primary Reason(s) for Virtual Visit: Encounter for evaluation before starting new chronic pain management plan of care (Level of risk: moderate) COVID-19*  Social distancing based on CDC ans AMA recommendations.    I contacted [name] on 12/07/2019 via telephone.      I clearly identified myself as Gillis Santa, MD. I verified that I was speaking with the correct person using two identifiers (Name: Angela Young, and date of birth:  Jun 11, 1931).  This visit was completed via telephone due to the restrictions of the COVID-19 pandemic. All issues as above were discussed and addressed but no physical exam was performed. If it was felt that the patient should be evaluated in the office, they were directed there. The patient verbally consented to this visit. Patient was unable to complete an audio/visual visit due to Technical difficulties and/or Lack of internet. Due to the catastrophic nature of the COVID-19 pandemic, this visit was done through audio contact only.  Location of the patient: home address (see Epic for details)  Location of the provider: office Advanced Informed Consent I sought verbal advanced consent from [name] for virtual visit interactions. I informed [name] of possible security and privacy concerns, risks, and limitations associated with providing "not-in-person" medical evaluation and management services. I also informed [name] of the availability of "in-person" appointments. Finally, I informed her that there would be a charge for the virtual visit and that she could be  personally, fully or partially, financially responsible for it. [name] expressed understanding and agreed to proceed.   Historic Elements   [name] is a 86 y.o. year old, female patient evaluated today after her last encounter by our practice on 11/21/2019. Angela Young  has a past medical history of Depression. She also  has a past surgical history that includes Hip surgery (Right, 05/13/11 ); Knee surgery (Left, 10/10); Abdominal hysterectomy; Eye surgery; Breast lumpectomy; and resection of right carotid tumor. Angela Young has a current medication list which includes the following prescription(s): prevagen, caffeine, vitamin d, cranberry, korean ginseng, magnesium, energy booster, folic acid, magnesium, [START ON 12/21/2019] oxycodone hcl, and [START ON  01/20/2020] oxycodone hcl. She  reports that she has quit smoking. Her  smoking use included cigarettes. She has never used smokeless tobacco. She reports that she does not drink alcohol or use drugs. Angela Young is allergic to ether.   HPI  Has completed UDS, can take over chronic opioid regimen below.   Please see first clinic note.  Of note patient has been on oxycodone 10 mg every 4-6 hours as needed for many years.  She has a history of bilateral hip replacement, left knee replacement.  Next time \patient comes to clinic, she will need to sign pain contract but this was discussed with her in great detail.  Controlled Substance Pharmacotherapy Assessment REMS (Risk Evaluation and Mitigation Strategy)  Analgesic:  11/21/2019  1   11/21/2019  Oxycodone Hcl 10 MG Tablet  150.00  30 An Bac   [id number]   [place] (3349)   0  75.00 MME  Medicare   [place]     Monitoring: [place] PMP: PDMP reviewed during this encounter.       Not applicable at this point since we have not taken over the patient's medication management yet. List of other Serum/Urine Drug Screening Test(s):  No results found for: AMPHSCRSER, BARBSCRSER, BENZOSCRSER, COCAINSCRSER, COCAINSCRNUR, PCPSCRSER, THCSCRSER, THCU, CANNABQUANT, OPIATESCRSER, OXYSCRSER, [place], [place] List of all UDS test(s) done:  Lab Results  Component Value Date   SUMMARY Note 11/29/2019   Last UDS on record: Summary  Date Value Ref Range Status  11/29/2019 Note  Final    Comment:    ==================================================================== Compliance Drug Analysis, Ur ==================================================================== Test                             Result       Flag       Units Drug Present and Declared for Prescription Verification   Oxycodone                      3522         EXPECTED   ng/mg creat   Oxymorphone                    3675         EXPECTED   ng/mg creat   Noroxycodone                   >8475        EXPECTED   ng/mg creat   Noroxymorphone                 1600         EXPECTED   ng/mg  creat    Sources of oxycodone are scheduled prescription medications.    Oxymorphone, noroxycodone, and noroxymorphone are expected    metabolites of oxycodone. Oxymorphone is also available as a    scheduled prescription medication. ==================================================================== Test                      Result    Flag   Units      Ref Range   Creatinine              118              mg/dL      >=20 ==================================================================== Declared Medications:  The flagging and interpretation on this report are based on the  following declared medications.  Unexpected results may  arise from  inaccuracies in the declared medications.  **Note: The testing scope of this panel includes these medications:  Oxycodone  **Note: The testing scope of this panel does not include the  following reported medications:  Caffeine  Cholecalciferol  Cranberry  Folic Acid  Magnesium  Multivitamin  Supplement (Prevagen) ==================================================================== For clinical consultation, please call [phone]. ====================================================================    UDS interpretation: No unexpected findings.          Medication Assessment Form: Patient introduced to form today (verbally reviewed) Treatment compliance: Treatment may start today if patient agrees with proposed plan. Evaluation of compliance is not applicable at this point Risk Assessment Profile: Aberrant behavior: See initial evaluations. None observed or detected today Comorbid factors increasing risk of overdose: See initial evaluation. No additional risks detected today Opioid risk tool (ORT):  No flowsheet data found.  ORT Scoring interpretation table:  Score <3 = Low Risk for SUD  Score between 4-7 = Moderate Risk for SUD  Score >8 = High Risk for Opioid Abuse   Risk of substance use disorder (SUD): Low  Risk Mitigation  Strategies:  Patient opioid safety counseling: Completed today. Counseling provided to patient as per "Patient Counseling Document". Document signed by patient, attesting to counseling and understanding Patient-Prescriber Agreement (PPA): Obtained today.  (verbal) Controlled substance notification to other providers: Written and sent today.  Pharmacologic Plan: Today we may be taking over the patient's pharmacological regimen. See below.             Meds   Current Outpatient Medications:  .  Apoaequorin (PREVAGEN) 10 MG CAPS, Take 10 mg by mouth every morning., Disp: , Rfl:  .  caffeine 200 MG TABS tablet, Take 200 mg by mouth every 4 (four) hours as needed., Disp: , Rfl:  .  Cholecalciferol (VITAMIN D) 2000 UNITS CAPS, Take by mouth., Disp: , Rfl:  .  Cranberry 1000 MG CAPS, Take by mouth as needed. occsaionally , Disp: , Rfl:  .  Korean Ginseng 100 MG CAPS, Take 2 capsules by mouth daily., Disp: , Rfl:  .  Magnesium 250 MG TABS, Take 250 mg by mouth at bedtime. As needed, Disp: , Rfl:  .  Multiple Vitamins-Minerals (ENERGY BOOSTER) PACK, Take by mouth. occasionally, Disp: , Rfl:  .  folic acid (FOLVITE) 416 MCG tablet, Take 400 mcg by mouth daily., Disp: , Rfl:  .  magnesium 30 MG tablet, Take 30 mg by mouth 2 (two) times daily., Disp: , Rfl:  .  [START ON 12/21/2019] Oxycodone HCl 10 MG TABS, One tablet every 4-6 hours, up to 5 tablets a day, Disp: 150 tablet, Rfl: 0 .  [START ON 01/20/2020] Oxycodone HCl 10 MG TABS, One tablet every 4-6 hours, up to 5 tablets a day, Disp: 150 tablet, Rfl: 0  Laboratory Chemistry Profile   Screening Lab Results  Component Value Date   STAPHAUREUS POSITIVE (A) 05/01/2011   MRSAPCR NEGATIVE 05/01/2011    Inflammation (CRP: Acute Phase) (ESR: Chronic Phase) No results found for: CRP, ESRSEDRATE, LATICACIDVEN                       Rheumatology No results found for: RF, ANA, LABURIC, URICUR, LYMEIGGIGMAB, LYMEABIGMQN, HLAB27                       Renal Lab Results  Component Value Date   BUN 20 04/13/2019   CREATININE 0.91 04/13/2019   BCR 22 04/13/2019  GFRAA 67 04/13/2019   GFRNONAA 58 (L) 04/13/2019                             Hepatic Lab Results  Component Value Date   AST 23 04/13/2019   ALT 20 04/13/2019   ALBUMIN 4.2 04/13/2019   ALKPHOS 66 04/13/2019                        Electrolytes Lab Results  Component Value Date   NA 140 04/13/2019   K 4.1 04/13/2019   CL 105 04/13/2019   CALCIUM 9.2 04/13/2019   PHOS 3.6 04/07/2017                        Neuropathy Lab Results  Component Value Date   VITAMINB12 580 12/13/2015   FOLATE >20.0 04/07/2017                        CNS No results found for: COLORCSF, APPEARCSF, RBCCOUNTCSF, WBCCSF, POLYSCSF, LYMPHSCSF, EOSCSF, PROTEINCSF, GLUCCSF, JCVIRUS, CSFOLI, IGGCSF, LABACHR, ACETBL                      Bone Lab Results  Component Value Date   VD25OH 17.2 (L) 04/08/2018                         Coagulation Lab Results  Component Value Date   INR 1.06 05/01/2011   LABPROT 14.0 05/01/2011   APTT 32 05/01/2011   PLT 147 (L) 04/13/2019                        Cardiovascular Lab Results  Component Value Date   HGB 14.7 04/13/2019   HCT 43.4 04/13/2019                         ID Lab Results  Component Value Date   STAPHAUREUS POSITIVE (A) 05/01/2011   MRSAPCR NEGATIVE 05/01/2011    Cancer No results found for: CEA, CA125, LABCA2                      Endocrine Lab Results  Component Value Date   TSH 0.955 04/13/2019                        Note: Lab results reviewed.   Assessment  The primary encounter diagnosis was Chronic pain syndrome. Diagnoses of CFIDS (chronic fatigue and immune dysfunction syndrome) (HCC), Chronic osteoarthritis, Moderate episode of recurrent major depressive disorder (HCC), Bilateral primary osteoarthritis of knee (left knee replacement), History of left knee replacement, History of bilateral hip replacements,  Opioid type dependence, continuous ([place]), and Pain management contract signed were also pertinent to this visit.  Plan of Care  I am having Kalee M. Payes start on Oxycodone HCl. I am also having her maintain her Vitamin D, Energy Booster, caffeine, magnesium, Korean Ginseng, Magnesium, Cranberry, Prevagen, folic acid, and Oxycodone HCl. Pharmacotherapy (Medications Ordered): Meds ordered this encounter  Medications  . Oxycodone HCl 10 MG TABS    Sig: One tablet every 4-6 hours, up to 5 tablets a day    Dispense:  150 tablet    Refill:  0    Do not fill <30 days from last refill.  For chronic pain  . Oxycodone HCl 10 MG TABS    Sig: One tablet every 4-6 hours, up to 5 tablets a day    Dispense:  150 tablet    Refill:  0    Do not fill <30 days from last refill. For chronic pain   Total duration of non-face-to-face encounter: 25 minutes.  Follow-up plan:   Return in about 9 weeks (around 02/08/2020) for Medication Management.    Recent Visits Date Type Provider Dept  11/21/19 Office Visit Gillis Santa, MD [facility]  Showing recent visits within past 90 days and meeting all other requirements   Today's Visits Date Type Provider Dept  12/07/19 Office Visit Gillis Santa, MD [facility]  Showing today's visits and meeting all other requirements   Future Appointments No visits were found meeting these conditions.  Showing future appointments within next 90 days and meeting all other requirements   Primary Care Physician: Virginia Crews, MD Location: Telephone Virtual Visit Note by: Gillis Santa, MD Date: 12/07/2019; Time: 10:33 AM  Note: This dictation was prepared with Dragon dictation. Any transcriptional errors that may result from this process are unintentional.  Disclaimer:  * Given the special circumstances of the COVID-19 pandemic, the federal government has announced that the Office for Civil Rights (OCR) will exercise its enforcement  discretion and will not impose penalties on physicians using telehealth in the event of noncompliance with regulatory requirements under the [place] (HIPAA) in connection with the good faith provision of telehealth during the YSAYT-01 national public health emergency. ([place])

## 2020-01-04 ENCOUNTER — Telehealth: Payer: Self-pay | Admitting: Student in an Organized Health Care Education/Training Program

## 2020-01-04 NOTE — Telephone Encounter (Signed)
Patient wants to get off pain meds, she is having problems and needs to get off meds. I scheduled appt for Monday at 1:15. She will need Nurse Call to update chart for appt.

## 2020-01-08 ENCOUNTER — Ambulatory Visit
Payer: Medicare Other | Attending: Student in an Organized Health Care Education/Training Program | Admitting: Student in an Organized Health Care Education/Training Program

## 2020-01-08 ENCOUNTER — Other Ambulatory Visit: Payer: Self-pay

## 2020-01-08 ENCOUNTER — Encounter: Payer: Self-pay | Admitting: Student in an Organized Health Care Education/Training Program

## 2020-01-08 DIAGNOSIS — R5382 Chronic fatigue, unspecified: Secondary | ICD-10-CM

## 2020-01-08 DIAGNOSIS — D8989 Other specified disorders involving the immune mechanism, not elsewhere classified: Secondary | ICD-10-CM | POA: Diagnosis not present

## 2020-01-08 DIAGNOSIS — M1990 Unspecified osteoarthritis, unspecified site: Secondary | ICD-10-CM

## 2020-01-08 DIAGNOSIS — M170 Bilateral primary osteoarthritis of knee: Secondary | ICD-10-CM

## 2020-01-08 DIAGNOSIS — G894 Chronic pain syndrome: Secondary | ICD-10-CM

## 2020-01-08 DIAGNOSIS — Z96652 Presence of left artificial knee joint: Secondary | ICD-10-CM

## 2020-01-08 DIAGNOSIS — F331 Major depressive disorder, recurrent, moderate: Secondary | ICD-10-CM

## 2020-01-08 MED ORDER — GABAPENTIN 100 MG PO CAPS: ORAL_CAPSULE | ORAL | 0 refills | Status: DC

## 2020-01-08 NOTE — Progress Notes (Signed)
Patient: Angela Young  Service Category: E/M  Provider: Gillis Santa, MD  DOB: 1931/05/13  DOS: 01/08/2020  Location: Office  MRN: [id number]  Setting: Ambulatory outpatient  Referring Provider: Virginia Crews, MD  Type: Established Patient  Specialty: Interventional Pain Management  PCP: Virginia Crews, MD  Location: Home  Delivery: TeleHealth     Virtual Encounter - Pain Management PROVIDER NOTE: Information contained herein reflects review and annotations entered in association with encounter. Interpretation of such information and data should be left to medically-trained personnel. Information provided to patient can be located elsewhere in the medical record under "Patient Instructions". Document created using STT-dictation technology, any transcriptional errors that may result from process are unintentional.    Contact & Pharmacy Preferred: [phone] Home: [phone] (home) Mobile: There is no such number on file (mobile). E-mail: No e-mail address on record  [place], [place] - [place] [address] Phone: [phone] Fax: [place], [place] - [place] [address] Phone: [phone] Fax: [phone]   Pre-screening  Angela Young offered "in-person" vs "virtual" encounter. She indicated preferring virtual for this encounter.   Reason COVID-19*  Social distancing based on CDC and AMA recommendations.   I contacted Angela Young on 01/08/2020 via telephone.      I clearly identified myself as Gillis Santa, MD. I verified that I was speaking with the correct person using two identifiers (Name: Angela Young, and date of birth: 11-26-1930).  This visit was completed via telephone due to the restrictions of the COVID-19 pandemic. All issues as above were discussed and addressed but no physical exam was performed. If it was felt that the patient should be  evaluated in the office, they were directed there. The patient verbally consented to this visit. Patient was unable to complete an audio/visual visit due to Technical difficulties and/or Lack of internet. Due to the catastrophic nature of the COVID-19 pandemic, this visit was done through audio contact only.  Location of the patient: home address (see Epic for details)  Location of the provider: office Consent I sought verbal advanced consent from Angela Young for virtual visit interactions. I informed Angela Young of possible security and privacy concerns, risks, and limitations associated with providing "not-in-person" medical evaluation and management services. I also informed Angela Young of the availability of "in-person" appointments. Finally, I informed her that there would be a charge for the virtual visit and that she could be  personally, fully or partially, financially responsible for it. Angela Young expressed understanding and agreed to proceed.   Historic Elements   Angela Young is a 86 y.o. year old, female patient evaluated today after her last contact with our practice on 01/04/2020. Angela Young  has a past medical history of Depression. She also  has a past surgical history that includes Hip surgery (Right, 05/13/11 ); Knee surgery (Left, 10/10); Abdominal hysterectomy; Eye surgery; Breast lumpectomy; and resection of right carotid tumor. Angela Young has a current medication list which includes the following prescription(s): caffeine, vitamin d, cranberry, korean ginseng, magnesium, energy booster, prevagen, folic acid, gabapentin, and magnesium. She  reports that she has quit smoking. Her smoking use included cigarettes. She has never used smokeless tobacco. She reports that she does not drink alcohol or use drugs. Angela Young is allergic to ether.   HPI  Today, she  is being contacted for medication management.   States that she has made decision to discontinue her opioid therapy.  She states  that is causing her some shortness of breath.  She also states that she also states that it is not helping to manage her pain as it once was.  Discussed the phenomenon of tolerance and even opioid-induced hyperalgesia.  Patient has been on long-term opioid therapy for many years prior to me taking her over.  I have only provided the patient with 1 prescription of her oxycodone.  We discussed weight instructions.  She currently takes the medication 5 times a day, total daily dose is 50 mg.  I informed her that she should reduce her dose to 40 mg for 1 week then 30 mg for the week after followed by 20 mg for another week followed by 10 mg for another week and then discontinue.  Pharmacotherapy Assessment  Analgesic: Oxycodone 10 mg up to 5 times a day as needed, daily dose equals 50 mg.  Will wean. Monitoring: [place] PMP: PDMP reviewed during this encounter.       Pharmacotherapy: Worsening neuropathic pain, causing shortness of breath and discomfort Compliance: No problems identified. Effectiveness: Not effective, patient dealing with opioid tolerance.  Will wean Plan: Refer to "POC".  UDS:  Summary  Date Value Ref Range Status  11/29/2019 Note  Final    Comment:    ==================================================================== Compliance Drug Analysis, Ur ==================================================================== Test                             Result       Flag       Units Drug Present and Declared for Prescription Verification   Oxycodone                      3522         EXPECTED   ng/mg creat   Oxymorphone                    3675         EXPECTED   ng/mg creat   Noroxycodone                   >8475        EXPECTED   ng/mg creat   Noroxymorphone                 1600         EXPECTED   ng/mg creat    Sources of oxycodone are scheduled prescription medications.    Oxymorphone, noroxycodone, and noroxymorphone are expected    metabolites of oxycodone. Oxymorphone is also  available as a    scheduled prescription medication. ==================================================================== Test                      Result    Flag   Units      Ref Range   Creatinine              118              mg/dL      >=20 ==================================================================== Declared Medications:  The flagging and interpretation on this report are based on the  following declared medications.  Unexpected results may arise from  inaccuracies in the declared medications.  **Note: The testing scope of this panel includes these medications:  Oxycodone  **Note: The  testing scope of this panel does not include the  following reported medications:  Caffeine  Cholecalciferol  Cranberry  Folic Acid  Magnesium  Multivitamin  Supplement (Prevagen) ==================================================================== For clinical consultation, please call [phone]. ====================================================================    Laboratory Chemistry Profile   Renal Lab Results  Component Value Date   BUN 20 04/13/2019   CREATININE 0.91 04/13/2019   BCR 22 04/13/2019   GFRAA 67 04/13/2019   GFRNONAA 58 (L) 04/13/2019    Hepatic Lab Results  Component Value Date   AST 23 04/13/2019   ALT 20 04/13/2019   ALBUMIN 4.2 04/13/2019   ALKPHOS 66 04/13/2019    Electrolytes Lab Results  Component Value Date   NA 140 04/13/2019   K 4.1 04/13/2019   CL 105 04/13/2019   CALCIUM 9.2 04/13/2019   PHOS 3.6 04/07/2017    Bone Lab Results  Component Value Date   VD25OH 17.2 (L) 04/08/2018    Inflammation (CRP: Acute Phase) (ESR: Chronic Phase) No results found for: CRP, ESRSEDRATE, LATICACIDVEN    Note: Above Lab results reviewed.   Assessment  The primary encounter diagnosis was Chronic pain syndrome. Diagnoses of CFIDS (chronic fatigue and immune dysfunction syndrome) (HCC), Chronic osteoarthritis, Moderate episode of recurrent major  depressive disorder (HCC), Bilateral primary osteoarthritis of knee (left knee replacement), and History of left knee replacement were also pertinent to this visit.  Plan of Care  Ms. GERI HEPLER has a current medication list which includes the following long-term medication(s): caffeine and gabapentin.  Per patient preference which I agree with, wean oxycodone as below. Reduce her dose to 40 mg for 1 week then 30 mg for the week after followed by 20 mg for another week followed by 10 mg for another week and then discontinue. Start gabapentin as below  Pharmacotherapy (Medications Ordered): Meds ordered this encounter  Medications  . gabapentin (NEURONTIN) 100 MG capsule    Sig: Take 1 capsule (100 mg total) by mouth 2 (two) times daily for 30 days, THEN 1 capsule (100 mg total) 3 (three) times daily.    Dispense:  150 capsule    Refill:  0    Follow-up plan:   Return in about 10 weeks (around 03/18/2020) for Medication Management, virtual.    Recent Visits Date Type Provider Dept  12/07/19 Office Visit Gillis Santa, MD [facility]  11/21/19 Office Visit Gillis Santa, MD [facility]  Showing recent visits within past 90 days and meeting all other requirements   Today's Visits Date Type Provider Dept  01/08/20 Office Visit Gillis Santa, MD [facility]  Showing today's visits and meeting all other requirements   Future Appointments No visits were found meeting these conditions.  Showing future appointments within next 90 days and meeting all other requirements   I discussed the assessment and treatment plan with the patient. The patient was provided an opportunity to ask questions and all were answered. The patient agreed with the plan and demonstrated an understanding of the instructions.  Patient advised to call back or seek an in-person evaluation if the symptoms or condition worsens.  Duration of encounter: 15 minutes.  Note by: Gillis Santa, MD Date: 01/08/2020; Time: 11:23 AM

## 2020-01-15 ENCOUNTER — Encounter: Payer: Medicare Other | Admitting: Student in an Organized Health Care Education/Training Program

## 2020-01-17 ENCOUNTER — Telehealth: Payer: Self-pay | Admitting: *Deleted

## 2020-01-17 NOTE — Telephone Encounter (Signed)
Please schedule a vv with Dr Cherylann Ratel so that patient may discuss his thoughts on her trying Humira.

## 2020-01-23 ENCOUNTER — Encounter: Payer: Self-pay | Admitting: Student in an Organized Health Care Education/Training Program

## 2020-01-23 ENCOUNTER — Other Ambulatory Visit: Payer: Self-pay

## 2020-01-23 ENCOUNTER — Ambulatory Visit
Payer: Medicare Other | Attending: Student in an Organized Health Care Education/Training Program | Admitting: Student in an Organized Health Care Education/Training Program

## 2020-01-23 VITALS — Ht 62.0 in | Wt 130.0 lb

## 2020-01-23 DIAGNOSIS — M1990 Unspecified osteoarthritis, unspecified site: Secondary | ICD-10-CM | POA: Diagnosis not present

## 2020-01-23 DIAGNOSIS — M170 Bilateral primary osteoarthritis of knee: Secondary | ICD-10-CM

## 2020-01-23 DIAGNOSIS — Z96652 Presence of left artificial knee joint: Secondary | ICD-10-CM | POA: Diagnosis not present

## 2020-01-23 DIAGNOSIS — G894 Chronic pain syndrome: Secondary | ICD-10-CM

## 2020-01-23 DIAGNOSIS — F331 Major depressive disorder, recurrent, moderate: Secondary | ICD-10-CM

## 2020-01-23 DIAGNOSIS — R5382 Chronic fatigue, unspecified: Secondary | ICD-10-CM | POA: Diagnosis not present

## 2020-01-23 DIAGNOSIS — G9332 Myalgic encephalomyelitis/chronic fatigue syndrome: Secondary | ICD-10-CM

## 2020-01-23 DIAGNOSIS — R202 Paresthesia of skin: Secondary | ICD-10-CM

## 2020-01-23 DIAGNOSIS — D8989 Other specified disorders involving the immune mechanism, not elsewhere classified: Secondary | ICD-10-CM

## 2020-01-23 MED ORDER — GABAPENTIN 100 MG PO CAPS: ORAL_CAPSULE | ORAL | 1 refills | Status: DC

## 2020-01-23 NOTE — Progress Notes (Signed)
Patient: Angela Young  Service Category: E/M  Provider: Gillis Santa, MD  DOB: January 06, 1931  DOS: 01/23/2020  Location: Office  MRN: [id number]  Setting: Ambulatory outpatient  Referring Provider: Virginia Crews, MD  Type: Established Patient  Specialty: Interventional Pain Management  PCP: Virginia Crews, MD  Location: Home  Delivery: TeleHealth     Virtual Encounter - Pain Management PROVIDER NOTE: Information contained herein reflects review and annotations entered in association with encounter. Interpretation of such information and data should be left to medically-trained personnel. Information provided to patient can be located elsewhere in the medical record under "Patient Instructions". Document created using STT-dictation technology, any transcriptional errors that may result from process are unintentional.    Contact & Pharmacy Preferred: [phone] Home: [phone] (home) Mobile: There is no such number on file (mobile). E-mail: No e-mail address on record  [place], [place] - [address] [place] [address] Phone: [phone] Fax: [place], [place] - [address] [place] [address] Phone: [phone] Fax: [phone]   Pre-screening  Ms. Arp offered "in-person" vs "virtual" encounter. She indicated preferring virtual for this encounter.   Reason COVID-19*  Social distancing based on CDC and AMA recommendations.   I contacted [name] on 01/23/2020 via telephone.      I clearly identified myself as Gillis Santa, MD. I verified that I was speaking with the correct person using two identifiers (Name: Angela Young, and date of birth: Oct 14, 1931).  This visit was completed via telephone due to the restrictions of the COVID-19 pandemic. All issues as above were discussed and addressed but no physical exam was performed. If it was felt that the patient should  be evaluated in the office, they were directed there. The patient verbally consented to this visit. Patient was unable to complete an audio/visual visit due to Technical difficulties and/or Lack of internet. Due to the catastrophic nature of the COVID-19 pandemic, this visit was done through audio contact only.  Location of the patient: home address (see Epic for details)  Location of the provider: office  Consent I sought verbal advanced consent from [name] for virtual visit interactions. I informed Ms. Ehrmann of possible security and privacy concerns, risks, and limitations associated with providing "not-in-person" medical evaluation and management services. I also informed Ms. Lince of the availability of "in-person" appointments. Finally, I informed her that there would be a charge for the virtual visit and that she could be  personally, fully or partially, financially responsible for it. Ms. Eichholz expressed understanding and agreed to proceed.   Historic Elements   [name] is a 86 y.o. year old, female patient evaluated today after her last contact with our practice on 01/17/2020. Ms. Plyler  has a past medical history of Depression. She also  has a past surgical history that includes Hip surgery (Right, 05/13/11 ); Knee surgery (Left, 10/10); Abdominal hysterectomy; Eye surgery; Breast lumpectomy; and resection of right carotid tumor. Ms. Lawlor has a current medication list which includes the following prescription(s): caffeine, vitamin d, cranberry, gabapentin, [place] ginseng, magnesium, oxycodone hcl, prevagen, folic acid, gabapentin, magnesium, and energy booster. She  reports that she has quit smoking. Her smoking use included cigarettes. She has never used smokeless tobacco. She reports that she does not drink alcohol or use drugs. Ms. Delao is allergic to ether.  HPI  Today, she is being contacted for medication management.   Patient states that she is continuing to wean  her oxycodone.  She is currently at a daily dose of 30 mg.  This is down from her previous daily dose of 50 mg.  She is currently taking gabapentin 100 mg twice daily.  She would like to increase her dose as she continues her oxycodone wean.  We did discuss buprenorphine therapy if she has trouble managing her pain at the oxycodone dose less than 10 mg.  Pharmacotherapy Assessment  Analgesic: Currently weaning oxycodone, 10 mg 3 times daily, daily dose 30 mg.  Patient will wean to 20 mg in approximately 1 week and will do that for 2 weeks before attempting to wean to 10 mg.   Monitoring: [place] PMP: PDMP reviewed during this encounter.       Pharmacotherapy: No side-effects or adverse reactions reported. Compliance: No problems identified. Effectiveness: Clinically acceptable. Plan: Refer to "POC".  UDS:  Summary  Date Value Ref Range Status  11/29/2019 Note  Final    Comment:    ==================================================================== Compliance Drug Analysis, Ur ==================================================================== Test                             Result       Flag       Units Drug Present and Declared for Prescription Verification   Oxycodone                      3522         EXPECTED   ng/mg creat   Oxymorphone                    3675         EXPECTED   ng/mg creat   Noroxycodone                   >8475        EXPECTED   ng/mg creat   Noroxymorphone                 1600         EXPECTED   ng/mg creat    Sources of oxycodone are scheduled prescription medications.    Oxymorphone, noroxycodone, and noroxymorphone are expected    metabolites of oxycodone. Oxymorphone is also available as a    scheduled prescription medication. ==================================================================== Test                      Result    Flag   Units      Ref Range   Creatinine              118              mg/dL       >=20 ==================================================================== Declared Medications:  The flagging and interpretation on this report are based on the  following declared medications.  Unexpected results may arise from  inaccuracies in the declared medications.  **Note: The testing scope of this panel includes these medications:  Oxycodone  **Note: The testing scope of this panel does not include the  following reported medications:  Caffeine  Cholecalciferol  Cranberry  Folic Acid  Magnesium  Multivitamin  Supplement (Prevagen) ==================================================================== For clinical consultation, please call [phone]. ====================================================================    Laboratory Chemistry Profile   Renal Lab Results  Component Value Date  BUN 20 04/13/2019   CREATININE 0.91 04/13/2019   BCR 22 04/13/2019   GFRAA 67 04/13/2019   GFRNONAA 58 (L) 04/13/2019    Hepatic Lab Results  Component Value Date   AST 23 04/13/2019   ALT 20 04/13/2019   ALBUMIN 4.2 04/13/2019   ALKPHOS 66 04/13/2019    Electrolytes Lab Results  Component Value Date   NA 140 04/13/2019   K 4.1 04/13/2019   CL 105 04/13/2019   CALCIUM 9.2 04/13/2019   PHOS 3.6 04/07/2017    Bone Lab Results  Component Value Date   VD25OH 17.2 (L) 04/08/2018    Inflammation (CRP: Acute Phase) (ESR: Chronic Phase) No results found for: CRP, ESRSEDRATE, LATICACIDVEN    Note: Above Lab results reviewed.  Imaging   Assessment  The primary encounter diagnosis was Chronic pain syndrome. Diagnoses of CFIDS (chronic fatigue and immune dysfunction syndrome) (HCC), Chronic osteoarthritis, Bilateral primary osteoarthritis of knee (left knee replacement), History of left knee replacement, Paresthesia, and Moderate episode of recurrent major depressive disorder ([place]) were also pertinent to this visit.  Plan of Care   Ms. SONAM WANDEL has a current  medication list which includes the following long-term medication(s): caffeine, gabapentin, and gabapentin.  Pharmacotherapy (Medications Ordered): Meds ordered this encounter  Medications  . gabapentin (NEURONTIN) 100 MG capsule    Sig: 100 mg during the day, 200 mg qhs    Dispense:  90 capsule    Refill:  1    Follow-up plan:   Return for Keep sch. appt.    Recent Visits Date Type Provider Dept  01/08/20 Office Visit Gillis Santa, MD [facility]  12/07/19 Office Visit Gillis Santa, MD [facility]  11/21/19 Office Visit Gillis Santa, MD [facility]  Showing recent visits within past 90 days and meeting all other requirements   Today's Visits Date Type Provider Dept  01/23/20 Office Visit Gillis Santa, MD [facility]  Showing today's visits and meeting all other requirements   Future Appointments Date Type Provider Dept  03/18/20 Appointment Gillis Santa, MD [facility]  Showing future appointments within next 90 days and meeting all other requirements   I discussed the assessment and treatment plan with the patient. The patient was provided an opportunity to ask questions and all were answered. The patient agreed with the plan and demonstrated an understanding of the instructions.  Patient advised to call back or seek an in-person evaluation if the symptoms or condition worsens.  Duration of encounter: 27mnutes.  Note by: BGillis Santa MD Date: 01/23/2020; Time: 9:27 AM

## 2020-02-08 ENCOUNTER — Telehealth: Payer: Self-pay

## 2020-02-08 ENCOUNTER — Encounter: Payer: Medicare Other | Admitting: Student in an Organized Health Care Education/Training Program

## 2020-02-08 NOTE — Telephone Encounter (Signed)
She can schedule an appointment with Dr. B to discuss this further. If she has other physical symptoms she can be scheduled sooner but I do not know if toxicology reports will be part of the workup.

## 2020-02-08 NOTE — Telephone Encounter (Signed)
Copied from CRM [id number]. Topic: General - Inquiry >> Feb 08, 2020  9:23 AM Reggie Pile, NT wrote: Reason for CRM: Patient called in stating she would like to have a toxicology report done as she believes her partials have started to deteriorate. Patient states she has been sick for about a week and cannot get a brass taste out of her mouth. Patient seeking advice if lab work can be done at office or if she should go to hospital. Patient has gone to dentist today and will be available afternoon time. Please advise

## 2020-02-08 NOTE — Telephone Encounter (Signed)
Patient was advised and states that she didn't want a visit with Dr.B just would like a call from Dr. Gerrianne Scale

## 2020-02-12 ENCOUNTER — Encounter: Payer: Self-pay | Admitting: Physician Assistant

## 2020-02-12 ENCOUNTER — Ambulatory Visit (INDEPENDENT_AMBULATORY_CARE_PROVIDER_SITE_OTHER): Payer: Medicare Other | Admitting: Physician Assistant

## 2020-02-12 ENCOUNTER — Other Ambulatory Visit: Payer: Self-pay

## 2020-02-12 ENCOUNTER — Other Ambulatory Visit
Admission: RE | Admit: 2020-02-12 | Discharge: 2020-02-12 | Disposition: A | Discharge status: Home / Self Care | Payer: Medicare Other | Source: Ambulatory Visit | Attending: Physician Assistant | Admitting: Physician Assistant

## 2020-02-12 ENCOUNTER — Ambulatory Visit: Payer: Medicare Other | Admitting: Student in an Organized Health Care Education/Training Program

## 2020-02-12 ENCOUNTER — Telehealth: Payer: Self-pay

## 2020-02-12 VITALS — BP 131/74 | HR 102 | Temp 96.9°F | Resp 16 | Wt 131.0 lb

## 2020-02-12 DIAGNOSIS — K1379 Other lesions of oral mucosa: Secondary | ICD-10-CM | POA: Insufficient documentation

## 2020-02-12 DIAGNOSIS — K6289 Other specified diseases of anus and rectum: Secondary | ICD-10-CM | POA: Diagnosis not present

## 2020-02-12 DIAGNOSIS — R197 Diarrhea, unspecified: Secondary | ICD-10-CM | POA: Insufficient documentation

## 2020-02-12 LAB — CBC WITH DIFFERENTIAL/PLATELET
Abs Immature Granulocytes: 0.01 10*3/uL (ref 0.00–0.07)
Basophils Absolute: 0.1 10*3/uL (ref 0.0–0.1)
Basophils Relative: 1 %
Eosinophils Absolute: 0.1 10*3/uL (ref 0.0–0.5)
Eosinophils Relative: 2 %
HCT: 39.4 % (ref 36.0–46.0)
Hemoglobin: 12.7 g/dL (ref 12.0–15.0)
Immature Granulocytes: 0 %
Lymphocytes Relative: 29 %
Lymphs Abs: 1.3 10*3/uL (ref 0.7–4.0)
MCH: 32.6 pg (ref 26.0–34.0)
MCHC: 32.2 g/dL (ref 30.0–36.0)
MCV: 101 fL — ABNORMAL HIGH (ref 80.0–100.0)
Monocytes Absolute: 0.4 10*3/uL (ref 0.1–1.0)
Monocytes Relative: 9 %
Neutro Abs: 2.7 10*3/uL (ref 1.7–7.7)
Neutrophils Relative %: 59 %
Platelets: 327 10*3/uL (ref 150–400)
RBC: 3.9 MIL/uL (ref 3.87–5.11)
RDW: 13 % (ref 11.5–15.5)
WBC: 4.5 10*3/uL (ref 4.0–10.5)
nRBC: 0 % (ref 0.0–0.2)

## 2020-02-12 LAB — COMPREHENSIVE METABOLIC PANEL
ALT: 22 U/L (ref 0–44)
AST: 19 U/L (ref 15–41)
Albumin: 2.8 g/dL — ABNORMAL LOW (ref 3.5–5.0)
Alkaline Phosphatase: 87 U/L (ref 38–126)
Anion gap: 8 (ref 5–15)
BUN: 12 mg/dL (ref 8–23)
CO2: 25 mmol/L (ref 22–32)
Calcium: 8.5 mg/dL — ABNORMAL LOW (ref 8.9–10.3)
Chloride: 106 mmol/L (ref 98–111)
Creatinine, Ser: 0.82 mg/dL (ref 0.44–1.00)
GFR calc Af Amer: >60 mL/min (ref >60)
GFR calc non Af Amer: >60 mL/min (ref >60)
Glucose, Bld: 100 mg/dL — ABNORMAL HIGH (ref 70–99)
Potassium: 3.9 mmol/L (ref 3.5–5.1)
Sodium: 139 mmol/L (ref 135–145)
Total Bilirubin: 0.6 mg/dL (ref 0.3–1.2)
Total Protein: 5.6 g/dL — ABNORMAL LOW (ref 6.5–8.1)

## 2020-02-12 MED ORDER — TRIAMCINOLONE ACETONIDE 0.1 % EX CREA: 1.0000 "application " | TOPICAL_CREAM | Freq: Two times a day (BID) | CUTANEOUS | 0 refills | Status: DC

## 2020-02-12 NOTE — Telephone Encounter (Signed)
Patient's daughter called back  Yesterday 02/11/20 from 02/08/2020 and stated that her mother is still ill and refused to go to the ED last week. She stated that her mother is still having food poisoning symptoms and is looking for toxicology blood work to be completed. She also stated that she is having mouth pain from her dentures. She previously stated that she did not want a visit with Dr. B just wanted Dr. B to call her. FYI

## 2020-02-12 NOTE — Telephone Encounter (Signed)
Looks like she is scheduled this afternoon with Antony Contras

## 2020-02-12 NOTE — Progress Notes (Signed)
Established patient visit      Patient: Angela Young   DOB: [birth year]   86 y.o. Female  MRN: [id number] Visit Date: 02/16/2020  Today's healthcare provider: Mar Daring, PA-C  Subjective:   No chief complaint on file.  HPI Patient wants to talk to talk about the medication problems with Gabapentin and sensitive toothpaste fresh mint. Reports that all the chemicals are interfering and reports that now her private areas are very sore. She feels the gabapentin is interacting with other things, like her toothpaste, causing a toxic exposure. Her toothpaste she states is from [place] and has "chemicals that are reacting with the gabapentin and making me sick." She is very focused on being "poisoned". Even called poison control over the toothpaste and was advised to just stop using that toothpaste. States she does not want to take the gabapentin.   Multiple times in the conversation she mentions that Dr. Holley Raring, Pain Management, is trying to "stop" her oxycodone. He was using Gabapentin for that and she feels it is "toxic" to her. She reports she was fine on oxycodone and doesn't know why he is changing her therapy. She states she has been on this for years and just doesn't think she could ever get off of it.   Patient Active Problem List   Diagnosis Date Noted  . Pain management contract signed 12/07/2019  . Bilateral primary osteoarthritis of knee (left knee replacement) 11/21/2019  . History of left knee replacement 11/21/2019  . History of bilateral hip replacements 11/21/2019  . Opioid type dependence, continuous ([place]) 11/21/2019  . Encounter for long-term opiate analgesic use 11/21/2019  . Chronic pain syndrome 04/13/2019  . Hard of hearing 04/13/2019  . Osteoarthritis of both hands 10/01/2017  . Anemia 04/07/2017  . Paresthesia 12/13/2015  . Abnormal ECG 09/09/2015  . Allergic rhinitis 09/09/2015  . Benign cyst of breast 09/09/2015  . Body mass index (BMI) of 26.0-26.9  in adult 09/09/2015  . CFIDS (chronic fatigue and immune dysfunction syndrome) ([place]) 09/09/2015  . MDD (major depressive disorder) 09/09/2015  . Bladder cystocele 09/09/2015  . Acid reflux 09/09/2015  . 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 History of polycythemia 09/09/2015  . Vitamin D deficiency 09/09/2015  . Dermatitis 09/09/2015  . Chronic osteoarthritis 04/30/2015  . Spondylosis without myelopathy 05/11/2006   Past Medical History:  Diagnosis Date  . Depression    Allergies  Allergen Reactions  . Ether        Medications: Outpatient Medications Prior to Visit  Medication Sig  . Oxycodone HCl 10 MG TABS Take 10 mg by mouth 2 (two) times daily. Patient states she is taking 3/day  . Apoaequorin (PREVAGEN) 10 MG CAPS Take 10 mg by mouth every morning.  . caffeine 200 MG TABS tablet Take 200 mg by mouth every 4 (four) hours as needed.  . Cholecalciferol (VITAMIN D) 2000 UNITS CAPS Take by mouth.  . Cranberry 1000 MG CAPS Take by mouth as needed. occsaionally   . folic acid (FOLVITE) 237 MCG tablet Take 400 mcg by mouth daily.  Marland Kitchen gabapentin (NEURONTIN) 100 MG capsule Take 1 capsule (100 mg total) by mouth 2 (two) times daily for 30 days, THEN 1 capsule (100 mg total) 3 (three) times daily. (Patient not taking: Reported on 01/22/2020)  . gabapentin (NEURONTIN) 100 MG capsule 100 mg during the day, 200 mg qhs (Patient not taking: Reported on 02/12/2020)  . [place] Ginseng 100 MG CAPS Take 2 capsules by mouth daily.  Marland Kitchen  Magnesium 250 MG TABS Take 250 mg by mouth at bedtime. As needed  . magnesium 30 MG tablet Take 30 mg by mouth 2 (two) times daily.  . Multiple Vitamins-Minerals (ENERGY BOOSTER) PACK Take by mouth. occasionally   No facility-administered medications prior to visit.    Review of Systems  Last CBC Lab Results  Component Value Date   WBC 4.5 02/12/2020   HGB 12.7 02/12/2020   HCT 39.4 02/12/2020   MCV 101.0 (H) 02/12/2020   MCH 32.6 02/12/2020   RDW 13.0  02/12/2020   PLT 327 02/12/2020   Last metabolic panel Lab Results  Component Value Date   GLUCOSE 100 (H) 02/12/2020   NA 139 02/12/2020   K 3.9 02/12/2020   CL 106 02/12/2020   CO2 25 02/12/2020   BUN 12 02/12/2020   CREATININE 0.82 02/12/2020   GFRNONAA >60 02/12/2020   GFRAA >60 02/12/2020   CALCIUM 8.5 (L) 02/12/2020   PHOS 3.6 04/07/2017   PROT 5.6 (L) 02/12/2020   ALBUMIN 2.8 (L) 02/12/2020   LABGLOB 1.7 04/13/2019   AGRATIO 2.5 (H) 04/13/2019   BILITOT 0.6 02/12/2020   ALKPHOS 87 02/12/2020   AST 19 02/12/2020   ALT 22 02/12/2020   ANIONGAP 8 02/12/2020   Last lipids Lab Results  Component Value Date   CHOL 200 (H) 04/13/2019   HDL 70 04/13/2019   LDLCALC 115 (H) 04/13/2019   TRIG 74 04/13/2019   CHOLHDL 2.9 04/13/2019   Last hemoglobin A1c No results found for: HGBA1C      Objective:    BP 131/74 (BP Location: Left Arm, Patient Position: Sitting, Cuff Size: Large)   Pulse (!) 102   Temp (!) 96.9 F (36.1 C) (Temporal)   Resp 16   Wt 131 lb (59.4 kg)   BMI 23.96 kg/m  BP Readings from Last 3 Encounters:  02/12/20 131/74  04/13/19 (!) 158/80  04/13/19 (!) 158/80   Wt Readings from Last 3 Encounters:  02/12/20 131 lb (59.4 kg)  01/22/20 130 lb (59 kg)  04/08/18 135 lb 3.2 oz (61.3 kg)      Physical Exam Vitals reviewed.  Constitutional:      General: She is not in acute distress.    Appearance: Normal appearance. She is well-developed and normal weight. She is not ill-appearing or diaphoretic.  HENT:     Head: Normocephalic and atraumatic.  Cardiovascular:     Rate and Rhythm: Normal rate and regular rhythm.     Heart sounds: Normal heart sounds. No murmur. No friction rub. No gallop.   Pulmonary:     Effort: Pulmonary effort is normal. No respiratory distress.     Breath sounds: Normal breath sounds. No wheezing or rales.  Abdominal:     General: Abdomen is flat. Bowel sounds are normal.     Palpations: Abdomen is soft.      Tenderness: There is no abdominal tenderness.  Musculoskeletal:     Cervical back: Normal range of motion and neck supple.  Neurological:     Mental Status: She is alert.       Results for orders placed or performed during the hospital encounter of 02/12/20  Comprehensive metabolic panel  Result Value Ref Range   Sodium 139 135 - 145 mmol/L   Potassium 3.9 3.5 - 5.1 mmol/L   Chloride 106 98 - 111 mmol/L   CO2 25 22 - 32 mmol/L   Glucose, Bld 100 (H) 70 - 99 mg/dL   BUN 12  8 - 23 mg/dL   Creatinine, Ser 3.24 0.44 - 1.00 mg/dL   Calcium 8.5 (L) 8.9 - 10.3 mg/dL   Total Protein 5.6 (L) 6.5 - 8.1 g/dL   Albumin 2.8 (L) 3.5 - 5.0 g/dL   AST 19 15 - 41 U/L   ALT 22 0 - 44 U/L   Alkaline Phosphatase 87 38 - 126 U/L   Total Bilirubin 0.6 0.3 - 1.2 mg/dL   GFR calc non Af Amer >60 >60 mL/min   GFR calc Af Amer >60 >60 mL/min   Anion gap 8 5 - 15  CBC with Differential/Platelet  Result Value Ref Range   WBC 4.5 4.0 - 10.5 K/uL   RBC 3.90 3.87 - 5.11 MIL/uL   Hemoglobin 12.7 12.0 - 15.0 g/dL   HCT 40.1 02.7 - 25.3 %   MCV 101.0 (H) 80.0 - 100.0 fL   MCH 32.6 26.0 - 34.0 pg   MCHC 32.2 30.0 - 36.0 g/dL   RDW 66.4 40.3 - 47.4 %   Platelets 327 150 - 400 K/uL   nRBC 0.0 0.0 - 0.2 %   Neutrophils Relative % 59 %   Neutro Abs 2.7 1.7 - 7.7 K/uL   Lymphocytes Relative 29 %   Lymphs Abs 1.3 0.7 - 4.0 K/uL   Monocytes Relative 9 %   Monocytes Absolute 0.4 0.1 - 1.0 K/uL   Eosinophils Relative 2 %   Eosinophils Absolute 0.1 0.0 - 0.5 K/uL   Basophils Relative 1 %   Basophils Absolute 0.1 0.0 - 0.1 K/uL   Immature Granulocytes 0 %   Abs Immature Granulocytes 0.01 0.00 - 0.07 K/uL  Miscellaneous [facility] test (send-out)  Result Value Ref Range   [facility] test code GABAPENTIN LEVEL    [facility] test name [id number]    Misc [facility] result COMMENT   Gabapentin level  Result Value Ref Range   Gabapentin Lvl <1.0 (L) 4.0 - 16.0 ug/mL      Assessment & Plan:    1. Irritation of  perirectal skin Will give triamcinolone cream for perirectal irritation. Advised to only use externally. Call if worsening.  - triamcinolone cream (KENALOG) 0.1 %; Apply 1 application topically 2 (two) times daily.  Dispense: 30 g; Refill: 0  2. Mouth sore Had been sore, but since she quit using toothpaste this has resolved.  - CBC w/Diff/Platelet - Comprehensive Metabolic Panel (CMET) - Gabapentin level  3. Diarrhea, unspecified type Reports having diarrhea, upset stomach and the mouth and skin irritations. Felt she was being poisoned. I feel she is very focused on the fact that Dr. Cherylann Ratel was trying to get her off her oxycodone and that is all that has ever worked for her. She even mentioned that the oxycodone is not what was reacting with her toothpaste, it was only the gabapentin. I will check labs for her to make sure everything is in normal ranges so she can be reassured she is not being poisoned.  - CBC w/Diff/Platelet - Comprehensive Metabolic Panel (CMET) - Gabapentin level      Reine Just  [facility] [phone] (phone) [phone] (fax)  [facility]

## 2020-02-13 ENCOUNTER — Telehealth: Payer: Self-pay | Admitting: *Deleted

## 2020-02-13 ENCOUNTER — Telehealth: Payer: Self-pay

## 2020-02-13 NOTE — Telephone Encounter (Signed)
-----   Message from Margaretann Loveless, PA-C sent at 02/13/2020  3:38 PM EDT ----- Labs are currently unremarkable. Send out lab for gabapentin toxicity is still pending.

## 2020-02-13 NOTE — Telephone Encounter (Signed)
Pt advised.   Thanks,   -Maryclaire Stoecker  

## 2020-02-13 NOTE — Telephone Encounter (Signed)
I spoke with Dr. Cherylann Ratel, he suggests she wean Oxycodone slowly. If she does not wish to take Gabapentin, she may try OTC Alpha Lipoic Acid, 600 mg once daily. Patient advised of this.

## 2020-02-14 LAB — MISC LABCORP TEST (SEND OUT): LabCorp test name: 716811

## 2020-02-14 LAB — GABAPENTIN LEVEL: Gabapentin Lvl: <1 ug/mL — ABNORMAL LOW (ref 4.0–16.0)

## 2020-02-15 ENCOUNTER — Encounter: Payer: Self-pay | Admitting: Student in an Organized Health Care Education/Training Program

## 2020-02-15 ENCOUNTER — Telehealth: Payer: Self-pay

## 2020-02-15 NOTE — Telephone Encounter (Signed)
Patient called stating that she is weaning off of her opioids and wanted to get off of them quicker.  Informed patient not to stop her opioids abruptly or it could cause other medical problems.   States she would like Dr Cherylann Ratel to order Humira or something like that.  Patient states she does not have a rheumatologist.  Patient agitated whenever questions were asked.  I had a hard time trying to understand what the patient was actually wanting from [place].  States that the medication that were being ordered for her were causing kidney problems.  Informed patient that Dr Cherylann Ratel was out of the office but I would send him the message.  Patient states that she cant wait until Monday.  Patient was rambling and difficult to converse with.

## 2020-02-15 NOTE — Telephone Encounter (Signed)
Spoke with Dr Cherylann Ratel.  Patient called back.  Clarified that she is taking oxycodone 3/day now. Instructed to continue the oxycodone and not to stop it abruptly.  Instructed patient if she felt like her breathing was compromised, she should see her PCP or go to the ED.  States she didn't think her breathing was any different with the decrease in oxycodone.  Instructed patient that we would set up a virtual visit with Dr Cherylann Ratel on Monday.  Patient states understanding and was seemed satisfied with having the appointment virtually on Monday.  Again, I encouraged patient to go to PCP or ED for any increase in symptoms.

## 2020-02-15 NOTE — Telephone Encounter (Signed)
Patient advised as directed below. 

## 2020-02-15 NOTE — Telephone Encounter (Signed)
-----   Message from Margaretann Loveless, PA-C sent at 02/15/2020  7:21 AM EDT ----- Gabapentin level is normal and not in a toxic range

## 2020-02-19 ENCOUNTER — Telehealth: Payer: Self-pay

## 2020-02-19 ENCOUNTER — Ambulatory Visit
Payer: Medicare Other | Attending: Student in an Organized Health Care Education/Training Program | Admitting: Student in an Organized Health Care Education/Training Program

## 2020-02-19 ENCOUNTER — Encounter: Payer: Self-pay | Admitting: Student in an Organized Health Care Education/Training Program

## 2020-02-19 ENCOUNTER — Other Ambulatory Visit: Payer: Self-pay

## 2020-02-19 DIAGNOSIS — G9332 Myalgic encephalomyelitis/chronic fatigue syndrome: Secondary | ICD-10-CM

## 2020-02-19 DIAGNOSIS — R5382 Chronic fatigue, unspecified: Secondary | ICD-10-CM

## 2020-02-19 DIAGNOSIS — G894 Chronic pain syndrome: Secondary | ICD-10-CM

## 2020-02-19 DIAGNOSIS — D8989 Other specified disorders involving the immune mechanism, not elsewhere classified: Secondary | ICD-10-CM

## 2020-02-19 DIAGNOSIS — M1990 Unspecified osteoarthritis, unspecified site: Secondary | ICD-10-CM

## 2020-02-19 NOTE — Progress Notes (Signed)
Patient: Angela Young  Service Category: E/M  Provider: Gillis Santa, MD  DOB: 1931-06-10  DOS: 02/19/2020  Location: Office  MRN: [id number]  Setting: Ambulatory outpatient  Referring Provider: Virginia Crews, MD  Type: Established Patient  Specialty: Interventional Pain Management  PCP: Virginia Crews, MD  Location: Home  Delivery: TeleHealth     Virtual Encounter - Pain Management PROVIDER NOTE: Information contained herein reflects review and annotations entered in association with encounter. Interpretation of such information and data should be left to medically-trained personnel. Information provided to patient can be located elsewhere in the medical record under "Patient Instructions". Document created using STT-dictation technology, any transcriptional errors that may result from process are unintentional.    Contact & Pharmacy Preferred: [phone] Home: [phone] (home) Mobile: There is no such number on file (mobile). E-mail: No e-mail address on record  [place], [place] - [address] [place] [address] Phone: [phone] Fax: [place], [place] - [address] [place] [address] Phone: [phone] Fax: [phone]   Pre-screening  Angela Young offered "in-person" vs "virtual" encounter. She indicated preferring virtual for this encounter.   Reason COVID-19*  Social distancing based on CDC and AMA recommendations.   I contacted Angela Young on 02/19/2020 via telephone.      I clearly identified myself as Gillis Santa, MD. I verified that I was speaking with the correct person using two identifiers (Name: Angela Young, and date of birth: 1931/04/01).  This visit was completed via telephone due to the restrictions of the COVID-19 pandemic. All issues as above were discussed and addressed but no physical exam was performed. If it was felt that the patient should  be evaluated in the office, they were directed there. The patient verbally consented to this visit. Patient was unable to complete an audio/visual visit due to Technical difficulties and/or Lack of internet. Due to the catastrophic nature of the COVID-19 pandemic, this visit was done through audio contact only.  Location of the patient: home address (see Epic for details)  Location of the provider: office  Consent I sought verbal advanced consent from Angela Young for virtual visit interactions. I informed Angela Young of possible security and privacy concerns, risks, and limitations associated with providing "not-in-person" medical evaluation and management services. I also informed Angela Young of the availability of "in-person" appointments. Finally, I informed her that there would be a charge for the virtual visit and that she could be  personally, fully or partially, financially responsible for it. Angela Young expressed understanding and agreed to proceed.   Historic Elements   Angela Young is a 86 y.o. year old, female patient evaluated today after her last contact with our practice on 02/15/2020. Angela Young  has a past medical history of Depression. She also  has a past surgical history that includes Hip surgery (Right, 05/13/11 ); Knee surgery (Left, 10/10); Abdominal hysterectomy; Eye surgery; Breast lumpectomy; and resection of right carotid tumor. Angela Young has a current medication list which includes the following prescription(s): prevagen, caffeine, vitamin d, magnesium, energy booster, oxycodone hcl, triamcinolone cream, cranberry, folic acid, korean ginseng, and magnesium. She  reports that she has quit smoking. Her smoking use included cigarettes. She has never used smokeless tobacco. She reports that she does not drink alcohol or use drugs. Angela Young is allergic to ether.  HPI  Today, she is being contacted for follow-up evaluation  Patient is very difficult to understand.  She is  fixated on starting Humira to help with pain.  She is also adamant about stopping her oxycodone which she has been on for many years.  She is currently down to 2 tablets a day.  She was previously taking 5 tablets a day.  She has failed gabapentin, Cymbalta, Effexor.  She is utilizing alpha lipoic acid but states that is impacting her kidneys.  This is not a common side effect of alpha lipoic acid which is an over-the-counter medication helpful for neuropathic pain.  At this point, I do not have anything additional to offer the patient.  There were times during the encounter that the patient was not making sense in terms of what is going on.  Tangential thoughts and pressured speech.  UDS:  Summary  Date Value Ref Range Status  11/29/2019 Note  Final    Comment:    ==================================================================== Compliance Drug Analysis, Ur ==================================================================== Test                             Result       Flag       Units Drug Present and Declared for Prescription Verification   Oxycodone                      3522         EXPECTED   ng/mg creat   Oxymorphone                    3675         EXPECTED   ng/mg creat   Noroxycodone                   >8475        EXPECTED   ng/mg creat   Noroxymorphone                 1600         EXPECTED   ng/mg creat    Sources of oxycodone are scheduled prescription medications.    Oxymorphone, noroxycodone, and noroxymorphone are expected    metabolites of oxycodone. Oxymorphone is also available as a    scheduled prescription medication. ==================================================================== Test                      Result    Flag   Units      Ref Range   Creatinine              118              mg/dL      >=20 ==================================================================== Declared Medications:  The flagging and interpretation on this report are based on the   following declared medications.  Unexpected results may arise from  inaccuracies in the declared medications.  **Note: The testing scope of this panel includes these medications:  Oxycodone  **Note: The testing scope of this panel does not include the  following reported medications:  Caffeine  Cholecalciferol  Cranberry  Folic Acid  Magnesium  Multivitamin  Supplement (Prevagen) ==================================================================== For clinical consultation, please call [phone]. ====================================================================    Laboratory Chemistry Profile   Renal Lab Results  Component Value Date   BUN 12 02/12/2020   CREATININE 0.82 02/12/2020   BCR 22 04/13/2019   GFRAA >60  02/12/2020   GFRNONAA >60 02/12/2020     Hepatic Lab Results  Component Value Date   AST 19 02/12/2020   ALT 22 02/12/2020   ALBUMIN 2.8 (L) 02/12/2020   ALKPHOS 87 02/12/2020     Electrolytes Lab Results  Component Value Date   NA 139 02/12/2020   K 3.9 02/12/2020   CL 106 02/12/2020   CALCIUM 8.5 (L) 02/12/2020   PHOS 3.6 04/07/2017     Bone Lab Results  Component Value Date   VD25OH 17.2 (L) 04/08/2018     Inflammation (CRP: Acute Phase) (ESR: Chronic Phase) No results found for: CRP, ESRSEDRATE, LATICACIDVEN     Note: Above Lab results reviewed.   Assessment  The primary encounter diagnosis was Chronic pain syndrome. Diagnoses of CFIDS (chronic fatigue and immune dysfunction syndrome) (HCC) and Chronic osteoarthritis were also pertinent to this visit.  Plan of Care  Angela Young has a current medication list which includes the following long-term medication(s): caffeine.  Patient wishes to continue with her oxycodone wean.  She is currently down to 2 tablets a day.  She hopes to decrease to 1 tablet a day this Friday.  She continues her alpha lipoic acid which she can continue it is over-the-counter..  She did not tolerate  gabapentin.  She has failed Effexor, Cymbalta.  Recommend follow-up with rheumatology regarding question of Humira.  Follow-up plan:   No follow-ups on file.   Recent Visits Date Type Provider Dept  01/23/20 Office Visit Gillis Santa, MD [facility]  01/08/20 Office Visit Gillis Santa, MD [facility]  12/07/19 Office Visit Gillis Santa, MD [facility]  11/21/19 Office Visit Gillis Santa, MD [facility]  Showing recent visits within past 90 days and meeting all other requirements   Today's Visits Date Type Provider Dept  02/19/20 Telemedicine Gillis Santa, MD [facility]  Showing today's visits and meeting all other requirements   Future Appointments Date Type Provider Dept  03/18/20 Appointment Gillis Santa, MD [facility]  Showing future appointments within next 90 days and meeting all other requirements   I discussed the assessment and treatment plan with the patient. The patient was provided an opportunity to ask questions and all were answered. The patient agreed with the plan and demonstrated an understanding of the instructions.  Patient advised to call back or seek an in-person evaluation if the symptoms or condition worsens.  Duration of encounter:15 minutes.  Note by: Gillis Santa, MD Date: 02/19/2020; Time: 3:28 PM

## 2020-02-19 NOTE — Telephone Encounter (Signed)
Was this message maybe supposed to be for pain management?  I do not prescribe her oxycodone and did not request that she be seen for another appt currently.  Looks like we are scheduled for appt in June.

## 2020-02-19 NOTE — Telephone Encounter (Signed)
Copied from CRM [id number]. Topic: General - Inquiry >> Feb 19, 2020 12:58 PM Floria Raveling A wrote: Reason for CRM: pt called in and stated she does not want to make another appt.  She is trying to go off the pain meds.  She would like to talk with Dr B or nurse to see if she could write her something else like Humira.  She stated she was just there on the 12th and does not want to make another apt . Please advise  Best number [phone]

## 2020-02-20 DIAGNOSIS — M1990 Unspecified osteoarthritis, unspecified site: Secondary | ICD-10-CM | POA: Diagnosis not present

## 2020-02-20 DIAGNOSIS — R0602 Shortness of breath: Secondary | ICD-10-CM | POA: Diagnosis not present

## 2020-02-20 DIAGNOSIS — R54 Age-related physical debility: Secondary | ICD-10-CM | POA: Diagnosis not present

## 2020-02-20 DIAGNOSIS — G8929 Other chronic pain: Secondary | ICD-10-CM | POA: Diagnosis not present

## 2020-02-20 DIAGNOSIS — N3000 Acute cystitis without hematuria: Secondary | ICD-10-CM | POA: Diagnosis not present

## 2020-02-20 DIAGNOSIS — R0600 Dyspnea, unspecified: Secondary | ICD-10-CM | POA: Diagnosis not present

## 2020-02-20 DIAGNOSIS — Z885 Allergy status to narcotic agent status: Secondary | ICD-10-CM | POA: Diagnosis not present

## 2020-02-20 DIAGNOSIS — K449 Diaphragmatic hernia without obstruction or gangrene: Secondary | ICD-10-CM | POA: Diagnosis not present

## 2020-02-20 NOTE — Telephone Encounter (Signed)
Tried calling; no answer.  PEC please advise pt as below if she calls back.   Thanks,   -Elesia Pemberton  

## 2020-02-20 NOTE — Telephone Encounter (Signed)
I have routed this message to her Pain management provider to review, Edward Jolly MD

## 2020-02-21 NOTE — Telephone Encounter (Signed)
Called pt.  Advised of message from Dr. Leonard Schwartz. Re: pain management.  The pt. reported she spoke with Dr. Cherylann Ratel about Humira, and he said he could not prescribe it, and that she would need to see a Rheumatologist.  Pt. stated she needs to get off the Oxycodone, but she is in pain.  Reported she is down to 2 tabs/ day, and continuing to wean herself off.  Stated she went to the Emergency room yesterday, due to the pain.  Reported that while in ER, she was diagnosed with a UTI.  Questioned if she would like to have Dr. B. Make a referral to a Rheumatologist.  The pt. stated "I already am going to call one today, so don't worry about it", and then pt. hung up phone.    Noted in ER note from [facility] ER, a referral has been made to a Rheumatologist.

## 2020-02-26 DIAGNOSIS — G6289 Other specified polyneuropathies: Secondary | ICD-10-CM | POA: Diagnosis not present

## 2020-02-26 DIAGNOSIS — R54 Age-related physical debility: Secondary | ICD-10-CM | POA: Diagnosis not present

## 2020-02-26 DIAGNOSIS — M19041 Primary osteoarthritis, right hand: Secondary | ICD-10-CM | POA: Diagnosis not present

## 2020-02-26 DIAGNOSIS — M19042 Primary osteoarthritis, left hand: Secondary | ICD-10-CM | POA: Diagnosis not present

## 2020-02-26 DIAGNOSIS — M180 Bilateral primary osteoarthritis of first carpometacarpal joints: Secondary | ICD-10-CM | POA: Diagnosis not present

## 2020-02-26 DIAGNOSIS — M7989 Other specified soft tissue disorders: Secondary | ICD-10-CM | POA: Diagnosis not present

## 2020-02-26 DIAGNOSIS — Z7689 Persons encountering health services in other specified circumstances: Secondary | ICD-10-CM | POA: Diagnosis not present

## 2020-02-26 DIAGNOSIS — M8949 Other hypertrophic osteoarthropathy, multiple sites: Secondary | ICD-10-CM | POA: Diagnosis not present

## 2020-02-26 DIAGNOSIS — R799 Abnormal finding of blood chemistry, unspecified: Secondary | ICD-10-CM | POA: Diagnosis not present

## 2020-03-14 NOTE — Progress Notes (Signed)
Attempted to call patient, No answer. Unable to leave a message machine just made a loud beep and then cut off

## 2020-03-15 ENCOUNTER — Encounter: Payer: Self-pay | Admitting: Student in an Organized Health Care Education/Training Program

## 2020-03-15 ENCOUNTER — Telehealth: Payer: Self-pay

## 2020-03-15 NOTE — Telephone Encounter (Signed)
Attempted to call patient.  After ringing several times, a loud piercing ring came on.  Unable to leave message.

## 2020-03-18 ENCOUNTER — Ambulatory Visit
Payer: Medicare Other | Attending: Student in an Organized Health Care Education/Training Program | Admitting: Student in an Organized Health Care Education/Training Program

## 2020-03-18 ENCOUNTER — Other Ambulatory Visit: Payer: Self-pay

## 2020-03-18 DIAGNOSIS — G894 Chronic pain syndrome: Secondary | ICD-10-CM

## 2020-03-18 NOTE — Progress Notes (Signed)
I attempted to call the patient however no response. No VM option -Dr Cedric Mcclaine  

## 2020-04-03 DIAGNOSIS — M8949 Other hypertrophic osteoarthropathy, multiple sites: Secondary | ICD-10-CM | POA: Diagnosis not present

## 2020-04-03 DIAGNOSIS — G6289 Other specified polyneuropathies: Secondary | ICD-10-CM | POA: Diagnosis not present

## 2020-04-03 DIAGNOSIS — G2581 Restless legs syndrome: Secondary | ICD-10-CM | POA: Diagnosis not present

## 2020-04-03 DIAGNOSIS — G894 Chronic pain syndrome: Secondary | ICD-10-CM | POA: Diagnosis not present

## 2020-04-12 DIAGNOSIS — H2512 Age-related nuclear cataract, left eye: Secondary | ICD-10-CM | POA: Diagnosis not present

## 2020-04-17 ENCOUNTER — Ambulatory Visit: Payer: Medicare Other

## 2020-04-17 ENCOUNTER — Encounter: Payer: Medicare Other | Admitting: Family Medicine

## 2020-04-17 DIAGNOSIS — G6289 Other specified polyneuropathies: Secondary | ICD-10-CM | POA: Diagnosis not present

## 2020-04-17 DIAGNOSIS — M8949 Other hypertrophic osteoarthropathy, multiple sites: Secondary | ICD-10-CM | POA: Diagnosis not present

## 2020-04-17 DIAGNOSIS — G894 Chronic pain syndrome: Secondary | ICD-10-CM | POA: Diagnosis not present

## 2020-04-17 DIAGNOSIS — G2581 Restless legs syndrome: Secondary | ICD-10-CM | POA: Diagnosis not present

## 2020-04-17 DIAGNOSIS — R54 Age-related physical debility: Secondary | ICD-10-CM | POA: Diagnosis not present

## 2020-04-25 ENCOUNTER — Encounter: Payer: Medicare Other | Admitting: Family Medicine

## 2020-05-13 DIAGNOSIS — M8949 Other hypertrophic osteoarthropathy, multiple sites: Secondary | ICD-10-CM | POA: Diagnosis not present

## 2020-05-13 DIAGNOSIS — G894 Chronic pain syndrome: Secondary | ICD-10-CM | POA: Diagnosis not present

## 2020-05-13 DIAGNOSIS — G2581 Restless legs syndrome: Secondary | ICD-10-CM | POA: Diagnosis not present

## 2020-05-13 DIAGNOSIS — R54 Age-related physical debility: Secondary | ICD-10-CM | POA: Diagnosis not present

## 2020-05-13 DIAGNOSIS — G6289 Other specified polyneuropathies: Secondary | ICD-10-CM | POA: Diagnosis not present

## 2020-07-17 ENCOUNTER — Telehealth: Payer: Self-pay | Admitting: Family Medicine

## 2020-07-17 NOTE — Telephone Encounter (Signed)
Patient declined the Medicare Wellness Visit with [facility].  Stated she has changed to another PCP, but could not provide their name but said they were not located in [place].

## 2020-07-19 NOTE — Telephone Encounter (Signed)
Need to change PCP in system (can use no provider) and make sure she changes it with her insurance to remove herself from our panel, so we won't keep calling about this.

## 2020-08-05 DIAGNOSIS — G6289 Other specified polyneuropathies: Secondary | ICD-10-CM | POA: Diagnosis not present

## 2020-08-05 DIAGNOSIS — M1990 Unspecified osteoarthritis, unspecified site: Secondary | ICD-10-CM | POA: Diagnosis not present

## 2020-08-05 DIAGNOSIS — G894 Chronic pain syndrome: Secondary | ICD-10-CM | POA: Diagnosis not present

## 2020-08-05 DIAGNOSIS — G2581 Restless legs syndrome: Secondary | ICD-10-CM | POA: Diagnosis not present

## 2020-08-05 DIAGNOSIS — H9190 Unspecified hearing loss, unspecified ear: Secondary | ICD-10-CM | POA: Diagnosis not present

## 2021-04-24 ENCOUNTER — Ambulatory Visit
Payer: Medicare Other | Attending: Student in an Organized Health Care Education/Training Program | Admitting: Student in an Organized Health Care Education/Training Program

## 2021-04-24 ENCOUNTER — Encounter: Payer: Self-pay | Admitting: Student in an Organized Health Care Education/Training Program

## 2021-04-24 ENCOUNTER — Other Ambulatory Visit: Payer: Self-pay

## 2021-04-24 VITALS — BP 137/67 | HR 99 | Temp 97.2°F | Resp 16 | Ht 62.0 in | Wt 125.0 lb

## 2021-04-24 DIAGNOSIS — D8989 Other specified disorders involving the immune mechanism, not elsewhere classified: Secondary | ICD-10-CM | POA: Diagnosis present

## 2021-04-24 DIAGNOSIS — R2689 Other abnormalities of gait and mobility: Secondary | ICD-10-CM

## 2021-04-24 DIAGNOSIS — G894 Chronic pain syndrome: Secondary | ICD-10-CM | POA: Diagnosis not present

## 2021-04-24 DIAGNOSIS — R5382 Chronic fatigue, unspecified: Secondary | ICD-10-CM

## 2021-04-24 DIAGNOSIS — G9332 Myalgic encephalomyelitis/chronic fatigue syndrome: Secondary | ICD-10-CM

## 2021-04-24 DIAGNOSIS — R202 Paresthesia of skin: Secondary | ICD-10-CM | POA: Diagnosis not present

## 2021-04-24 MED ORDER — OXYCODONE HCL 10 MG PO TABS: 10.0000 mg | ORAL_TABLET | Freq: Two times a day (BID) | ORAL | 0 refills | Status: AC | PRN

## 2021-04-24 MED ORDER — ALPHA-LIPOIC ACID 600 MG PO CAPS: 600.0000 mg | ORAL_CAPSULE | Freq: Every day | ORAL | 0 refills | Status: AC

## 2021-04-24 NOTE — Progress Notes (Signed)
Safety precautions to be maintained throughout the outpatient stay will include: orient to surroundings, keep bed in low position, maintain call bell within reach at all times, provide assistance with transfer out of bed and ambulation.  

## 2021-04-24 NOTE — Progress Notes (Signed)
PROVIDER NOTE: Information contained herein reflects review and annotations entered in association with encounter. Interpretation of such information and data should be left to medically-trained personnel. Information provided to patient can be located elsewhere in the medical record under "Patient Instructions". Document created using STT-dictation technology, any transcriptional errors that may result from process are unintentional.    Patient: Angela Young  Service Category: E/M  Provider: Gillis Santa, MD  DOB: [birth year]  DOS: 04/24/2021  Specialty: Interventional Pain Management  MRN: [id number]  Setting: Ambulatory outpatient  PCP: No primary care provider on file.  Type: Established Patient    Referring Provider: No ref. provider found  Location: Office  Delivery: Face-to-face     HPI  [name], a 87 y.o. year old female, is here today because of her Chronic pain syndrome [G89.4]. Angela Young's primary complain today is Knee Pain (right) and Shoulder Pain (right) L Pertinent problems: Angela Young has CFIDS (chronic fatigue and immune dysfunction syndrome) (Young); Chronic pain syndrome; Bilateral primary osteoarthritis of knee (left knee replacement); History of left knee replacement; History of bilateral hip replacements; Opioid type dependence, continuous ([place] Young); Encounter for long-term opiate analgesic use; Pain management contract signed; and Imbalance on their pertinent problem list. Pain Assessment: Severity of Chronic pain is reported as a 10-Worst pain ever/10. Location: Other (Comment) (r. knee, r. shoulder, both legs, lower back)  / . Onset: More than a month ago. Quality: Aching, Sharp. Timing: Constant. Modifying factor(s): Oxycodone, Tumeric, menthol cream. Vitals:  height is 5' 2"  (1.575 m) and weight is 125 lb (56.7 kg). Her temporal temperature is 97.2 F (36.2 C) (abnormal). Her blood pressure is 137/67 and her pulse is 99. Her respiration is 16 and oxygen saturation is 97%.    Reason for encounter:  Patient presents today for medication management.  Is endorsing right knee pain and right shoulder pain.  She has a history of left knee replacement as well as bilateral hip replacements.  She is concerned about ALS and multiple sclerosis given imbalance issues that she has been experiencing.  She is also been experiencing weakness of her bilateral lower extremity.  She has chronic pain syndrome and is oxycodone 10 mg 1 to 2 tablets daily as needed for superior pain.  Patient is requesting a referral to neurology for her imbalance and evaluation of MS.  I informed her that she likely does not have MS but she insists on having this worked up.  Patient is hard of hearing so obtaining information was somewhat challenging.   Pharmacotherapy Assessment  Analgesic: Oxycodone 10 mg, BID prn  Monitoring: [place] PMP: PDMP reviewed during this encounter.       Pharmacotherapy: No side-effects or adverse reactions reported. Compliance: No problems identified. Effectiveness: Clinically acceptable.  Landis Martins, RN  04/24/2021 10:50 AM  Sign when Signing Visit Safety precautions to be maintained throughout the outpatient stay will include: orient to surroundings, keep bed in low position, maintain call bell within reach at all times, provide assistance with transfer out of bed and ambulation.       UDS:  Summary  Date Value Ref Range Status  11/29/2019 Note  Final    Comment:    ==================================================================== Compliance Drug Analysis, Ur ==================================================================== Test                             Result       Flag  Units Drug Present and Declared for Prescription Verification   Oxycodone                      3522         EXPECTED   ng/mg creat   Oxymorphone                    3675         EXPECTED   ng/mg creat   Noroxycodone                   >8475        EXPECTED   ng/mg creat    Noroxymorphone                 1600         EXPECTED   ng/mg creat    Sources of oxycodone are scheduled prescription medications.    Oxymorphone, noroxycodone, and noroxymorphone are expected    metabolites of oxycodone. Oxymorphone is also available as a    scheduled prescription medication. ==================================================================== Test                      Result    Flag   Units      Ref Range   Creatinine              118              mg/dL      >=20 ==================================================================== Declared Medications:  The flagging and interpretation on this report are based on the  following declared medications.  Unexpected results may arise from  inaccuracies in the declared medications.  **Note: The testing scope of this panel includes these medications:  Oxycodone  **Note: The testing scope of this panel does not include the  following reported medications:  Caffeine  Cholecalciferol  Cranberry  Folic Acid  Magnesium  Multivitamin  Supplement (Prevagen) ==================================================================== For clinical consultation, please call [phone]. ====================================================================      ROS  Constitutional: Denies any fever or chills Gastrointestinal: No reported hemesis, hematochezia, vomiting, or acute GI distress Musculoskeletal:  Bilateral hip pain, bilateral knee pain, low back pain Neurological: No reported episodes of acute onset apraxia, aphasia, dysarthria, agnosia, amnesia, paralysis, loss of coordination, or loss of consciousness  Medication Review  Alpha-Lipoic Acid, Cranberry, Energy Booster, Oxycodone HCl, Vitamin D, and folic acid  History Review  Allergy: [name] is allergic to duloxetine, ether, and morphine. Drug: [name]  reports no history of drug use. Alcohol:  reports no history of alcohol use. Tobacco:  reports that she has quit  smoking. Her smoking use included cigarettes. She has never used smokeless tobacco. Social: [name]  reports that she has quit smoking. Her smoking use included cigarettes. She has never used smokeless tobacco. She reports that she does not drink alcohol and does not use drugs. Medical:  has a past medical history of Depression. Surgical: Ms. Leyh  has a past surgical history that includes Hip surgery (Right, 05/13/11 ); Knee surgery (Left, 10/10); Abdominal hysterectomy; Eye surgery; Breast lumpectomy; and resection of right carotid tumor. Family: family history includes Anemia in her mother; Arthritis in her brother, mother, and sister; Heart disease in her sister; Stroke in her mother.  Laboratory Chemistry Profile   Renal Lab Results  Component Value Date   BUN 12 02/12/2020   CREATININE 0.82 02/12/2020   BCR 22 04/13/2019   GFRAA >60  02/12/2020   GFRNONAA >60 02/12/2020     Hepatic Lab Results  Component Value Date   AST 19 02/12/2020   ALT 22 02/12/2020   ALBUMIN 2.8 (L) 02/12/2020   ALKPHOS 87 02/12/2020     Electrolytes Lab Results  Component Value Date   NA 139 02/12/2020   K 3.9 02/12/2020   CL 106 02/12/2020   CALCIUM 8.5 (L) 02/12/2020   PHOS 3.6 04/07/2017     Bone Lab Results  Component Value Date   VD25OH 17.2 (L) 04/08/2018     Inflammation (CRP: Acute Phase) (ESR: Chronic Phase) No results found for: CRP, ESRSEDRATE, LATICACIDVEN     Note: Above Lab results reviewed.   Physical Exam  General appearance: Well nourished, well developed, and well hydrated. In no apparent acute distress Mental status: Alert, oriented x 3 (person, place, & time)       Respiratory: No evidence of acute respiratory distress Eyes: PERLA Vitals: BP 137/67 (BP Location: Right Arm, Patient Position: Sitting, Cuff Size: Normal)   Pulse 99   Temp (!) 97.2 F (36.2 C) (Temporal)   Resp 16   Ht 5' 2"  (1.575 m)   Wt 125 lb (56.7 kg)   SpO2 97%   BMI 22.86 kg/m  BMI:  Estimated body mass index is 22.86 kg/m as calculated from the following:   Height as of this encounter: 5' 2"  (1.575 m).   Weight as of this encounter: 125 lb (56.7 kg). Ideal: Ideal body weight: 50.1 kg (110 lb 7.2 oz) Adjusted ideal body weight: 52.7 kg (116 lb 4.3 oz)  Bilateral shoulder pain, pain with abduction Arthropathic pain pattern Low back pain, pain with facet loading Limited hip flexion, patient is in wheelchair 4 out of 5 strength bilateral lower extremity: Plantar flexion, dorsiflexion, knee flexion, knee extension.  Assessment   Diagnosis  1. Chronic pain syndrome   2. Imbalance   3. CFIDS (chronic fatigue and immune dysfunction syndrome) (HCC)   4. Paresthesia      Updated Problems: Problem  Imbalance  Pain Management Contract Signed  Bilateral primary osteoarthritis of knee (left knee replacement)  History of Left Knee Replacement  History of Bilateral Hip Replacements  Opioid Type Dependence, Continuous (Hcc)  Encounter for Long-Term Opiate Analgesic Use  Chronic Pain Syndrome  Cfids (Chronic Fatigue and Immune Dysfunction Syndrome) (Hcc)    Plan of Care    Pharmacotherapy (Medications Ordered): Meds ordered this encounter  Medications   Oxycodone HCl 10 MG TABS    Sig: Take 1 tablet (10 mg total) by mouth 2 (two) times daily as needed. Patient states she is taking 3/day    Dispense:  60 tablet    Refill:  0   Alpha-Lipoic Acid 600 MG CAPS    Sig: Take 1 capsule (600 mg total) by mouth daily.    Dispense:  30 capsule    Refill:  0    Do not place medication on "Automatic Refill". Fill one day early if pharmacy is closed on scheduled refill date.   Orders:  Orders Placed This Encounter  Procedures   Ambulatory referral to Neurology    Referral Priority:   Routine    Referral Type:   Consultation    Referral Reason:   Specialty Services Required    Referred to Provider:   Anabel Bene, MD    Requested Specialty:   Neurology    Number  of Visits Requested:   1   Follow-up plan:   Return  for pt will call for refill appt.    Recent Visits No visits were found meeting these conditions. Showing recent visits within past 90 days and meeting all other requirements Today's Visits Date Type Provider Dept  04/24/21 Office Visit Gillis Santa, MD [facility]  Showing today's visits and meeting all other requirements Future Appointments No visits were found meeting these conditions. Showing future appointments within next 90 days and meeting all other requirements I discussed the assessment and treatment plan with the patient. The patient was provided an opportunity to ask questions and all were answered. The patient agreed with the plan and demonstrated an understanding of the instructions.  Patient advised to call back or seek an in-person evaluation if the symptoms or condition worsens.  Duration of encounter: 44mnutes.  Note by: BGillis Santa MD Date: 04/24/2021; Time: 1:19 PM

## 2021-04-24 NOTE — Progress Notes (Deleted)
Patient: Angela Young  Service Category: E/M  Provider: Gillis Santa, MD  DOB: 14-Jun-1931  DOS: 04/24/2021  Location: Office  MRN: [id number]  Setting: Ambulatory outpatient  Referring Provider: No ref. provider found  Type: Established Patient  Specialty: Interventional Pain Management  PCP: No primary care provider on file.  Location: Home  Delivery: TeleHealth     Virtual Encounter - Pain Management PROVIDER NOTE: Information contained herein reflects review and annotations entered in association with encounter. Interpretation of such information and data should be left to medically-trained personnel. Information provided to patient can be located elsewhere in the medical record under "Patient Instructions". Document created using STT-dictation technology, any transcriptional errors that may result from process are unintentional.    Contact & Pharmacy Preferred: [phone] Home: [phone] (home) Mobile: There is no such number on file (mobile). E-mail: No e-mail address on record  [place], [place] - [place] [address] Phone: [phone] Fax: [place], [place] - [address] [place] [address] Phone: [phone] Fax: [phone]   Pre-screening  Angela Young offered "in-person" vs "virtual" encounter. She indicated preferring virtual for this encounter.   Reason COVID-19*  Social distancing based on CDC and AMA recommendations.   I contacted Angela Young on 04/24/2021 via telephone.      I clearly identified myself as Gillis Santa, MD. I verified that I was speaking with the correct person using two identifiers (Name: Angela Young, and date of birth: 1930/12/28).  This visit was completed via telephone due to the restrictions of the COVID-19 pandemic. All issues as above were discussed and addressed but no physical exam was performed. If it was felt that the patient  should be evaluated in the office, they were directed there. The patient verbally consented to this visit. Patient was unable to complete an audio/visual visit due to Technical difficulties and/or Lack of internet. Due to the catastrophic nature of the COVID-19 pandemic, this visit was done through audio contact only. Location of the patient: home address (see Epic for details) Location of the provider: office  Consent I sought verbal advanced consent from Angela Young for virtual visit interactions. I informed Angela Young of possible security and privacy concerns, risks, and limitations associated with providing "not-in-person" medical evaluation and management services. I also informed Angela Young of the availability of "in-person" appointments. Finally, I informed her that there would be a charge for the virtual visit and that she could be  personally, fully or partially, financially responsible for it. Angela Young expressed understanding and agreed to proceed.   Historic Elements   Angela Young is a 87 y.o. year old, female patient evaluated today after her last contact with our practice on 01/17/2020. Angela Young  has a past medical history of Depression. She also  has a past surgical history that includes Hip surgery (Right, 05/13/11 ); Knee surgery (Left, 10/10); Abdominal hysterectomy; Eye surgery; Breast lumpectomy; and resection of right carotid tumor. Angela Young has a current medication list which includes the following prescription(s): alpha-lipoic acid, vitamin d, cranberry, folic acid, energy booster, and oxycodone hcl. She  reports that she has quit smoking. Her smoking use included cigarettes. She has never used smokeless tobacco. She reports that she does not drink alcohol and does not use drugs. Angela Young is allergic to duloxetine, ether, and morphine.   HPI  Today, she is being contacted for medication management.   Patient states that she is continuing to wean her oxycodone.  She is  currently at a daily dose of 30 mg.  This is down from her previous daily dose of 50 mg.  She is currently taking gabapentin 100 mg twice daily.  She would like to increase her dose as she continues her oxycodone wean.  We did discuss buprenorphine therapy if she has trouble managing her pain at the oxycodone dose less than 10 mg.  Pharmacotherapy Assessment  Analgesic: Currently weaning oxycodone, 10 mg 3 times daily, daily dose 30 mg.  Patient will wean to 20 mg in approximately 1 week and will do that for 2 weeks before attempting to wean to 10 mg.   Monitoring: [place] PMP: PDMP not reviewed this encounter.       Pharmacotherapy: No side-effects or adverse reactions reported. Compliance: No problems identified. Effectiveness: Clinically acceptable. Plan: Refer to "POC".  UDS:  Summary  Date Value Ref Range Status  11/29/2019 Note  Final    Comment:    ==================================================================== Compliance Drug Analysis, Ur ==================================================================== Test                             Result       Flag       Units Drug Present and Declared for Prescription Verification   Oxycodone                      3522         EXPECTED   ng/mg creat   Oxymorphone                    3675         EXPECTED   ng/mg creat   Noroxycodone                   >8475        EXPECTED   ng/mg creat   Noroxymorphone                 1600         EXPECTED   ng/mg creat    Sources of oxycodone are scheduled prescription medications.    Oxymorphone, noroxycodone, and noroxymorphone are expected    metabolites of oxycodone. Oxymorphone is also available as a    scheduled prescription medication. ==================================================================== Test                      Result    Flag   Units      Ref Range   Creatinine              118              mg/dL       >=20 ==================================================================== Declared Medications:  The flagging and interpretation on this report are based on the  following declared medications.  Unexpected results may arise from  inaccuracies in the declared medications.  **Note: The testing scope of this panel includes these medications:  Oxycodone  **Note: The testing scope of this panel does not include the  following reported medications:  Caffeine  Cholecalciferol  Cranberry  Folic Acid  Magnesium  Multivitamin  Supplement (Prevagen) ==================================================================== For clinical consultation, please call [phone]. ====================================================================    Laboratory Chemistry Profile   Renal Lab Results  Component Value Date  BUN 12 02/12/2020   CREATININE 0.82 02/12/2020   BCR 22 04/13/2019   GFRAA >60 02/12/2020   GFRNONAA >60 02/12/2020    Hepatic Lab Results  Component Value Date   AST 19 02/12/2020   ALT 22 02/12/2020   ALBUMIN 2.8 (L) 02/12/2020   ALKPHOS 87 02/12/2020    Electrolytes Lab Results  Component Value Date   NA 139 02/12/2020   K 3.9 02/12/2020   CL 106 02/12/2020   CALCIUM 8.5 (L) 02/12/2020   PHOS 3.6 04/07/2017    Bone Lab Results  Component Value Date   VD25OH 17.2 (L) 04/08/2018    Inflammation (CRP: Acute Phase) (ESR: Chronic Phase) No results found for: CRP, ESRSEDRATE, LATICACIDVEN    Note: Above Lab results reviewed.  Imaging   Assessment  The primary encounter diagnosis was Chronic pain syndrome. Diagnoses of Imbalance, CFIDS (chronic fatigue and immune dysfunction syndrome) ([place]), and Paresthesia were also pertinent to this visit.  Plan of Care   Angela Young is not on any long-term medications.  Pharmacotherapy (Medications Ordered): Meds ordered this encounter  Medications   Oxycodone HCl 10 MG TABS    Sig: Take 1 tablet (10 mg total)  by mouth 2 (two) times daily as needed. Patient states she is taking 3/day    Dispense:  60 tablet    Refill:  0   Alpha-Lipoic Acid 600 MG CAPS    Sig: Take 1 capsule (600 mg total) by mouth daily.    Dispense:  30 capsule    Refill:  0    Do not place medication on "Automatic Refill". Fill one day early if pharmacy is closed on scheduled refill date.    Follow-up plan:   Return for pt will call for refill.    Recent Visits No visits were found meeting these conditions. Showing recent visits within past 90 days and meeting all other requirements Today's Visits Date Type Provider Dept  04/24/21 Office Visit Gillis Santa, MD [facility]  Showing today's visits and meeting all other requirements Future Appointments No visits were found meeting these conditions. Showing future appointments within next 90 days and meeting all other requirements  I discussed the assessment and treatment plan with the patient. The patient was provided an opportunity to ask questions and all were answered. The patient agreed with the plan and demonstrated an understanding of the instructions.  Patient advised to call back or seek an in-person evaluation if the symptoms or condition worsens.  Duration of encounter: 60mnutes.  Note by: BGillis Santa MD Date: 04/24/2021; Time: 1:00 PM

## 2021-05-25 ENCOUNTER — Emergency Department
Admission: EM | Admit: 2021-05-25 | Discharge: 2021-05-25 | Disposition: A | Discharge status: Home / Self Care | Payer: Medicare Other | Attending: Emergency Medicine | Admitting: Emergency Medicine

## 2021-05-25 ENCOUNTER — Other Ambulatory Visit: Payer: Self-pay

## 2021-05-25 DIAGNOSIS — K0889 Other specified disorders of teeth and supporting structures: Secondary | ICD-10-CM | POA: Insufficient documentation

## 2021-05-25 DIAGNOSIS — R220 Localized swelling, mass and lump, head: Secondary | ICD-10-CM | POA: Diagnosis not present

## 2021-05-25 DIAGNOSIS — Z5321 Procedure and treatment not carried out due to patient leaving prior to being seen by health care provider: Secondary | ICD-10-CM | POA: Diagnosis not present

## 2021-05-25 LAB — CBC
HCT: 46.3 % — ABNORMAL HIGH (ref 36.0–46.0)
Hemoglobin: 16.3 g/dL — ABNORMAL HIGH (ref 12.0–15.0)
MCH: 33.7 pg (ref 26.0–34.0)
MCHC: 35.2 g/dL (ref 30.0–36.0)
MCV: 95.7 fL (ref 80.0–100.0)
Platelets: 157 10*3/uL (ref 150–400)
RBC: 4.84 MIL/uL (ref 3.87–5.11)
RDW: 12.6 % (ref 11.5–15.5)
WBC: 5.4 10*3/uL (ref 4.0–10.5)
nRBC: 0 % (ref 0.0–0.2)

## 2021-05-25 LAB — COMPREHENSIVE METABOLIC PANEL
ALT: 21 U/L (ref 0–44)
AST: 22 U/L (ref 15–41)
Albumin: 4.2 g/dL (ref 3.5–5.0)
Alkaline Phosphatase: 60 U/L (ref 38–126)
Anion gap: 7 (ref 5–15)
BUN: 12 mg/dL (ref 8–23)
CO2: 24 mmol/L (ref 22–32)
Calcium: 9.4 mg/dL (ref 8.9–10.3)
Chloride: 107 mmol/L (ref 98–111)
Creatinine, Ser: 0.68 mg/dL (ref 0.44–1.00)
GFR, Estimated: >60 mL/min (ref >60)
Glucose, Bld: 119 mg/dL — ABNORMAL HIGH (ref 70–99)
Potassium: 3.9 mmol/L (ref 3.5–5.1)
Sodium: 138 mmol/L (ref 135–145)
Total Bilirubin: 2.9 mg/dL — ABNORMAL HIGH (ref 0.3–1.2)
Total Protein: 6.6 g/dL (ref 6.5–8.1)

## 2021-05-25 NOTE — ED Triage Notes (Signed)
Pt arrives to ER c/o R sided dental pain and jaw swelling. Noticably swollen. States had dental cap come off 2-3 days ago and states hurts to move move/eat/etc. A&O, in wheelchair.

## 2021-05-25 NOTE — ED Notes (Signed)
Pt continues to loudly verbalize displeasue about wait. Pt states she could have gone to dentist. Pt updated multiple times about wait.

## 2021-05-25 NOTE — ED Notes (Signed)
Pt in lobby, upset about wait. tp states she could have gone to the dentist faster. Pt states she has seen other people go before her. Pt informed multiple times that those people are also going to triage and then fielded to another waiting room. Pt continues to express displeasure about wait. Pt states "i've been here almost two hours".

## 2021-05-25 NOTE — ED Notes (Signed)
Pt states she is leaving, she does not wish to wait any longer. Pt wheeled out to car for daughter.

## 2021-07-17 ENCOUNTER — Ambulatory Visit
Payer: Medicare Other | Attending: Student in an Organized Health Care Education/Training Program | Admitting: Student in an Organized Health Care Education/Training Program

## 2021-07-17 ENCOUNTER — Other Ambulatory Visit: Payer: Self-pay

## 2021-07-17 ENCOUNTER — Encounter: Payer: Self-pay | Admitting: Student in an Organized Health Care Education/Training Program

## 2021-07-17 VITALS — BP 145/90 | HR 78 | Temp 97.2°F | Resp 15 | Ht 62.0 in | Wt 120.0 lb

## 2021-07-17 DIAGNOSIS — M1990 Unspecified osteoarthritis, unspecified site: Secondary | ICD-10-CM | POA: Insufficient documentation

## 2021-07-17 DIAGNOSIS — F331 Major depressive disorder, recurrent, moderate: Secondary | ICD-10-CM | POA: Diagnosis present

## 2021-07-17 DIAGNOSIS — Z96652 Presence of left artificial knee joint: Secondary | ICD-10-CM | POA: Insufficient documentation

## 2021-07-17 DIAGNOSIS — G894 Chronic pain syndrome: Secondary | ICD-10-CM | POA: Diagnosis not present

## 2021-07-17 DIAGNOSIS — R2689 Other abnormalities of gait and mobility: Secondary | ICD-10-CM | POA: Diagnosis not present

## 2021-07-17 DIAGNOSIS — R5382 Chronic fatigue, unspecified: Secondary | ICD-10-CM | POA: Insufficient documentation

## 2021-07-17 DIAGNOSIS — D8989 Other specified disorders involving the immune mechanism, not elsewhere classified: Secondary | ICD-10-CM | POA: Diagnosis present

## 2021-07-17 MED ORDER — OXYCODONE HCL 10 MG PO TABS: 10.0000 mg | ORAL_TABLET | Freq: Two times a day (BID) | ORAL | 0 refills | Status: AC | PRN

## 2021-07-17 NOTE — Progress Notes (Signed)
Nursing Pain Medication Assessment:  Safety precautions to be maintained throughout the outpatient stay will include: orient to surroundings, keep bed in low position, maintain call bell within reach at all times, provide assistance with transfer out of bed and ambulation.  Medication Inspection Compliance: Pill count conducted under aseptic conditions, in front of the patient. Neither the pills nor the bottle was removed from the patient's sight at any time. Once count was completed pills were immediately returned to the patient in their original bottle.  Medication: Oxycodone IR Pill/Patch Count:  0 of 60 pills remain Pill/Patch Appearance: Markings consistent with prescribed medication Bottle Appearance: Standard pharmacy container. Clearly labeled. Filled Date: 6 / 24 / 2022 Last Medication intake:  Today Safety precautions to be maintained throughout the outpatient stay will include: orient to surroundings, keep bed in low position, maintain call bell within reach at all times, provide assistance with transfer out of bed and ambulation.

## 2021-07-17 NOTE — Progress Notes (Signed)
PROVIDER NOTE: Information contained herein reflects review and annotations entered in association with encounter. Interpretation of such information and data should be left to medically-trained personnel. Information provided to patient can be located elsewhere in the medical record under "Patient Instructions". Document created using STT-dictation technology, any transcriptional errors that may result from process are unintentional.    Patient: Angela Young  Service Category: E/M  Provider: Gillis Santa, MD  DOB: 03/09/1931  DOS: 07/17/2021  Specialty: Interventional Pain Management  MRN: [id number]  Setting: Ambulatory outpatient  PCP: No primary care provider on file.  Type: Established Patient    Referring Provider: No ref. provider found  Location: Office  Delivery: Face-to-face     HPI  [name], a 87 y.o. year old female, is here today because of her Chronic pain syndrome [G89.4]. Angela Young's primary complain today is Knee Pain (Right/)  Pertinent problems: Angela Young has CFIDS (chronic fatigue and immune dysfunction syndrome) ([place]); Chronic pain syndrome; Bilateral primary osteoarthritis of knee (left knee replacement); History of left knee replacement; History of bilateral hip replacements; Opioid type dependence, continuous ([place]); Encounter for long-term opiate analgesic use; Pain management contract signed; and Imbalance on their pertinent problem list. Pain Assessment: Severity of Chronic pain is reported as a 6 /10. Location: Knee Right/pain radiaties everywhere. Onset: More than a month ago. Quality: Aching, Throbbing, Burning, Constant. Timing: Constant. Modifying factor(s): pain radiaites everywhere. Vitals:  height is 5' 2"  (1.575 m) and weight is 120 lb (54.4 kg). Her temperature is 97.2 F (36.2 C) (abnormal). Her blood pressure is 145/90 (abnormal) and her pulse is 78. Her respiration is 15 and oxygen saturation is 95%.   Reason for encounter:  Patient presents today  for medication management.  Is endorsing right knee pain and right shoulder pain.  She has a history of left knee replacement as well as bilateral hip replacements. She has chronic pain syndrome and is on oxycodone 10 mg 1 to 2 tablets daily as needed for pain.  She has an upcoming appointment with neurology to discuss her balance issues.  She was requesting an increase in her hydrocodone which I do not recommend given that the patient has chronic fatigue and immune dysfunction and was on high-dose opioid therapy in the past (not by me) which was not effective for her pain and resulted in more side effects of confusion, amnesia, cognitive impairment.   Pharmacotherapy Assessment  Analgesic: Oxycodone 10 mg, BID prn  Monitoring: [place] PMP: PDMP reviewed during this encounter.       Pharmacotherapy: No side-effects or adverse reactions reported. Compliance: No problems identified. Effectiveness: Clinically acceptable.  Chauncey Fischer, RN  07/17/2021  1:39 PM  Sign when Signing Visit Nursing Pain Medication Assessment:  Safety precautions to be maintained throughout the outpatient stay will include: orient to surroundings, keep bed in low position, maintain call bell within reach at all times, provide assistance with transfer out of bed and ambulation.  Medication Inspection Compliance: Pill count conducted under aseptic conditions, in front of the patient. Neither the pills nor the bottle was removed from the patient's sight at any time. Once count was completed pills were immediately returned to the patient in their original bottle.  Medication: Oxycodone IR Pill/Patch Count:  0 of 60 pills remain Pill/Patch Appearance: Markings consistent with prescribed medication Bottle Appearance: Standard pharmacy container. Clearly labeled. Filled Date: 6 / 24 / 2022 Last Medication intake:  Today Safety precautions to be maintained throughout the outpatient stay  will include: orient to surroundings, keep  bed in low position, maintain call bell within reach at all times, provide assistance with transfer out of bed and ambulation.       UDS:  Summary  Date Value Ref Range Status  11/29/2019 Note  Final    Comment:    ==================================================================== Compliance Drug Analysis, Ur ==================================================================== Test                             Result       Flag       Units Drug Present and Declared for Prescription Verification   Oxycodone                      3522         EXPECTED   ng/mg creat   Oxymorphone                    3675         EXPECTED   ng/mg creat   Noroxycodone                   >8475        EXPECTED   ng/mg creat   Noroxymorphone                 1600         EXPECTED   ng/mg creat    Sources of oxycodone are scheduled prescription medications.    Oxymorphone, noroxycodone, and noroxymorphone are expected    metabolites of oxycodone. Oxymorphone is also available as a    scheduled prescription medication. ==================================================================== Test                      Result    Flag   Units      Ref Range   Creatinine              118              mg/dL      >=20 ==================================================================== Declared Medications:  The flagging and interpretation on this report are based on the  following declared medications.  Unexpected results may arise from  inaccuracies in the declared medications.  **Note: The testing scope of this panel includes these medications:  Oxycodone  **Note: The testing scope of this panel does not include the  following reported medications:  Caffeine  Cholecalciferol  Cranberry  Folic Acid  Magnesium  Multivitamin  Supplement (Prevagen) ==================================================================== For clinical consultation, please call [phone]. ====================================================================      ROS  Constitutional: Denies any fever or chills Gastrointestinal: No reported hemesis, hematochezia, vomiting, or acute GI distress Musculoskeletal:  Bilateral hip pain, bilateral knee pain, low back pain Neurological: No reported episodes of acute onset apraxia, aphasia, dysarthria, agnosia, amnesia, paralysis, loss of coordination, or loss of consciousness  Medication Review  Cranberry, Energy Booster, Oxycodone HCl, Vitamin D, and folic acid  History Review  Allergy: Angela Young is allergic to duloxetine, ether, and morphine. Drug: Angela Young  reports no history of drug use. Alcohol:  reports no history of alcohol use. Tobacco:  reports that she has quit smoking. Her smoking use included cigarettes. She has never used smokeless tobacco. Social: Angela Young  reports that she has quit smoking. Her smoking use included cigarettes. She has never used smokeless tobacco. She reports that she does not drink alcohol and  does not use drugs. Medical:  has a past medical history of Depression. Surgical: Angela Young  has a past surgical history that includes Hip surgery (Right, 05/13/11 ); Knee surgery (Left, 10/10); Abdominal hysterectomy; Eye surgery; Breast lumpectomy; and resection of right carotid tumor. Family: family history includes Anemia in her mother; Arthritis in her brother, mother, and sister; Heart disease in her sister; Stroke in her mother.  Laboratory Chemistry Profile   Renal Lab Results  Component Value Date   BUN 12 05/25/2021   CREATININE 0.68 05/25/2021   BCR 22 04/13/2019   GFRAA >60 02/12/2020   GFRNONAA >60 05/25/2021     Hepatic Lab Results  Component Value Date   AST 22 05/25/2021   ALT 21 05/25/2021   ALBUMIN 4.2 05/25/2021   ALKPHOS 60 05/25/2021     Electrolytes Lab Results  Component Value Date   NA 138 05/25/2021   K 3.9 05/25/2021   CL 107 05/25/2021   CALCIUM 9.4  05/25/2021   PHOS 3.6 04/07/2017     Bone Lab Results  Component Value Date   VD25OH 17.2 (L) 04/08/2018     Inflammation (CRP: Acute Phase) (ESR: Chronic Phase) No results found for: CRP, ESRSEDRATE, LATICACIDVEN     Note: Above Lab results reviewed.   Physical Exam  General appearance: Well nourished, well developed, and well hydrated. In no apparent acute distress Mental status: Alert, oriented x 3 (person, place, & time)       Respiratory: No evidence of acute respiratory distress Eyes: PERLA Vitals: BP (!) 145/90   Pulse 78   Temp (!) 97.2 F (36.2 C)   Resp 15   Ht 5' 2"  (1.575 m)   Wt 120 lb (54.4 kg)   SpO2 95%   BMI 21.95 kg/m  BMI: Estimated body mass index is 21.95 kg/m as calculated from the following:   Height as of this encounter: 5' 2"  (1.575 m).   Weight as of this encounter: 120 lb (54.4 kg). Ideal: Ideal body weight: 50.1 kg (110 lb 7.2 oz) Adjusted ideal body weight: 51.8 kg (114 lb 4.3 oz)  Bilateral shoulder pain, pain with abduction Arthropathic pain pattern Low back pain, pain with facet loading Limited hip flexion, patient is in wheelchair 4 out of 5 strength bilateral lower extremity: Plantar flexion, dorsiflexion, knee flexion, knee extension.  Assessment   Diagnosis  1. Chronic pain syndrome   2. Imbalance   3. CFIDS (chronic fatigue and immune dysfunction syndrome) ([facility])   4. Chronic osteoarthritis   5. History of left knee replacement   6. Moderate episode of recurrent major depressive disorder ([place])       Plan of Care    Pharmacotherapy (Medications Ordered): Meds ordered this encounter  Medications   Oxycodone HCl 10 MG TABS    Sig: Take 1 tablet (10 mg total) by mouth 2 (two) times daily as needed. Must last 30 days.    Dispense:  60 tablet    Refill:  0    Chronic Pain: STOP Act (Not applicable) Fill 1 day early if closed on refill date. Avoid benzodiazepines within 8 hours of opioids   Oxycodone HCl 10 MG TABS     Sig: Take 1 tablet (10 mg total) by mouth 2 (two) times daily as needed. Must last 30 days.    Dispense:  60 tablet    Refill:  0    Chronic Pain: STOP Act (Not applicable) Fill 1 day early if closed on refill date. Avoid  benzodiazepines within 8 hours of opioids   Oxycodone HCl 10 MG TABS    Sig: Take 1 tablet (10 mg total) by mouth 2 (two) times daily as needed. Must last 30 days.    Dispense:  60 tablet    Refill:  0    Chronic Pain: STOP Act (Not applicable) Fill 1 day early if closed on refill date. Avoid benzodiazepines within 8 hours of opioids    Orders:  Orders Placed This Encounter  Procedures   ToxASSURE Select 13 (MW), Urine    Volume: 30 ml(s). Minimum 3 ml of urine is needed. Document temperature of fresh sample. Indications: Long term (current) use of opiate analgesic [phone])    Order Specific Question:   Release to patient    Answer:   Immediate    Follow-up plan:   Return in about 6 months (around 01/14/2022) for Medication Management, in person.    Recent Visits Date Type Provider Dept  04/24/21 Office Visit Gillis Santa, MD [facility]  Showing recent visits within past 90 days and meeting all other requirements Today's Visits Date Type Provider Dept  07/17/21 Office Visit Gillis Santa, MD [facility]  Showing today's visits and meeting all other requirements Future Appointments No visits were found meeting these conditions. Showing future appointments within next 90 days and meeting all other requirements I discussed the assessment and treatment plan with the patient. The patient was provided an opportunity to ask questions and all were answered. The patient agreed with the plan and demonstrated an understanding of the instructions.  Patient advised to call back or seek an in-person evaluation if the symptoms or condition worsens.  Duration of encounter: 58mnutes.  Note by: BGillis Santa MD Date: 07/17/2021; Time: 2:37 PM

## 2021-12-17 ENCOUNTER — Telehealth: Payer: Self-pay | Admitting: Student in an Organized Health Care Education/Training Program

## 2021-12-25 ENCOUNTER — Encounter: Payer: Self-pay | Admitting: Student in an Organized Health Care Education/Training Program

## 2021-12-25 ENCOUNTER — Other Ambulatory Visit: Payer: Self-pay

## 2021-12-25 ENCOUNTER — Ambulatory Visit
Payer: Medicare Other | Attending: Student in an Organized Health Care Education/Training Program | Admitting: Student in an Organized Health Care Education/Training Program

## 2021-12-25 VITALS — BP 135/102 | HR 93 | Temp 98.2°F | Resp 15 | Ht 62.0 in | Wt 120.0 lb

## 2021-12-25 DIAGNOSIS — D8989 Other specified disorders involving the immune mechanism, not elsewhere classified: Secondary | ICD-10-CM | POA: Diagnosis present

## 2021-12-25 DIAGNOSIS — R2689 Other abnormalities of gait and mobility: Secondary | ICD-10-CM | POA: Diagnosis present

## 2021-12-25 DIAGNOSIS — G894 Chronic pain syndrome: Secondary | ICD-10-CM | POA: Diagnosis present

## 2021-12-25 DIAGNOSIS — F331 Major depressive disorder, recurrent, moderate: Secondary | ICD-10-CM | POA: Insufficient documentation

## 2021-12-25 DIAGNOSIS — G9332 Myalgic encephalomyelitis/chronic fatigue syndrome: Secondary | ICD-10-CM | POA: Insufficient documentation

## 2021-12-25 DIAGNOSIS — Z96652 Presence of left artificial knee joint: Secondary | ICD-10-CM | POA: Insufficient documentation

## 2021-12-25 DIAGNOSIS — R202 Paresthesia of skin: Secondary | ICD-10-CM | POA: Insufficient documentation

## 2021-12-25 DIAGNOSIS — M1990 Unspecified osteoarthritis, unspecified site: Secondary | ICD-10-CM | POA: Diagnosis present

## 2021-12-25 MED ORDER — OXYCODONE HCL 10 MG PO TABS
10.0000 mg | ORAL_TABLET | Freq: Two times a day (BID) | ORAL | 0 refills | Status: AC | PRN
Start: 2021-12-25 — End: 2022-01-24

## 2021-12-25 MED ORDER — OXYCODONE HCL 10 MG PO TABS: 10.0000 mg | ORAL_TABLET | Freq: Two times a day (BID) | ORAL | 0 refills | Status: AC | PRN

## 2021-12-25 NOTE — Progress Notes (Signed)
Nursing Pain Medication Assessment:  Safety precautions to be maintained throughout the outpatient stay will include: orient to surroundings, keep bed in low position, maintain call bell within reach at all times, provide assistance with transfer out of bed and ambulation.  Medication Inspection Compliance: Pill count conducted under aseptic conditions, in front of the patient. Neither the pills nor the bottle was removed from the patient's sight at any time. Once count was completed pills were immediately returned to the patient in their original bottle.  Medication:  oxycodone Pill/Patch Count:  0 of 60 pills remain Pill/Patch Appearance:  no pills Bottle Appearance: Standard pharmacy container. Clearly labeled. Filled Date: 54 / 29 / 2022 Last Medication intake:  Ran out of medicine more than 48 hours ago

## 2021-12-25 NOTE — Patient Instructions (Addendum)
You must return to our clinic within the next 2 weeks, M-Thurs, during office hours, to give a urine sample.   Make a follow up appt. ONLY when you have picked up the 3rd RX each Rx   Each RX should last 30-60 days

## 2021-12-25 NOTE — Progress Notes (Signed)
PROVIDER NOTE: Information contained herein reflects review and annotations entered in association with encounter. Interpretation of such information and data should be left to medically-trained personnel. Information provided to patient can be located elsewhere in the medical record under "Patient Instructions". Document created using STT-dictation technology, any transcriptional errors that may result from process are unintentional.    Patient: Angela Young  Service Category: E/M  Provider: Gillis Santa, MD  DOB: 1931/10/12  DOS: 12/25/2021  Specialty: Interventional Pain Management  MRN: [id number]  Setting: Ambulatory outpatient  PCP: No primary care provider on file.  Type: Established Patient    Referring Provider: No ref. provider found  Location: Office  Delivery: Face-to-face     HPI  [name], a 88 y.o. year old female, is here today because of her Chronic pain syndrome [G89.4]. Angela Young's primary complain today is Hand Pain (Bilat neuropathy), Knee Pain (right), and Shoulder Pain (right)   Pertinent problems: Angela Young has CFIDS (chronic fatigue and immune dysfunction syndrome) ([place]); Chronic pain syndrome; Bilateral primary osteoarthritis of knee (left knee replacement); History of left knee replacement; History of bilateral hip replacements; Opioid type dependence, continuous ([place]); Encounter for long-term opiate analgesic use; Pain management contract signed; and Imbalance on their pertinent problem list. Pain Assessment: Severity of Chronic pain is reported as a 6 /10. Location: Hand Right, Left/ . Onset: More than a month ago. Quality: Numbness. Timing: Constant. Modifying factor(s):  Marland Kitchen Vitals:  height is 5' 2"  (1.575 m) and weight is 120 lb (54.4 kg). Her temporal temperature is 98.2 F (36.8 C). Her blood pressure is 135/102 (abnormal) and her pulse is 93. Her respiration is 15 and oxygen saturation is 97%.   Reason for encounter:  Patient presents today for medication  management. Hx of right knee pain and right shoulder pain due to degenerative joint disease and osteoarthritis.  She has a history of left knee replacement as well as bilateral hip replacements. She has chronic pain syndrome and is on oxycodone 10 mg 1 to 2 tablets daily as needed for pain. She was requesting an increase in her hydrocodone which I do not recommend given that the patient has chronic fatigue and immune dysfunction and was on high-dose opioid therapy in the past (not by me) which was not effective for her pain and resulted in more side effects of confusion, amnesia, cognitive impairment.  I have explained this to the patient on many occasions but given her cognitive dysfunction she has issues with memory recall.  I again reinforced this clearly to the patient's granddaughter.  I have even recommended her weaning her opioid medication but she has been very resistant to this.  She has been on chronic opioid therapy for over 20 years and at much higher doses in the past which resulted in negative side effects that contributed to her current condition of central sensitization and cognitive dysfunction.  Overall she states that oxycodone does help her function better and decreases her overall pain.  She ran out last week.   Pharmacotherapy Assessment  Analgesic: Oxycodone 10 mg, BID prn  Monitoring: [place] PMP: PDMP reviewed during this encounter.       Pharmacotherapy: No side-effects or adverse reactions reported. Compliance: No problems identified. Effectiveness: Clinically acceptable.  Rise Patience, RN  12/25/2021 11:18 AM  Sign when Signing Visit Nursing Pain Medication Assessment:  Safety precautions to be maintained throughout the outpatient stay will include: orient to surroundings, keep bed in low position, maintain call bell within  reach at all times, provide assistance with transfer out of bed and ambulation.  Medication Inspection Compliance: Pill count conducted under aseptic  conditions, in front of the patient. Neither the pills nor the bottle was removed from the patient's sight at any time. Once count was completed pills were immediately returned to the patient in their original bottle.  Medication:  oxycodone Pill/Patch Count:  0 of 60 pills remain Pill/Patch Appearance:  no pills Bottle Appearance: Standard pharmacy container. Clearly labeled. Filled Date: 41 / 29 / 2022 Last Medication intake:  Ran out of medicine more than 48 hours ago   UDS:  Summary  Date Value Ref Range Status  11/29/2019 Note  Final    Comment:    ==================================================================== Compliance Drug Analysis, Ur ==================================================================== Test                             Result       Flag       Units Drug Present and Declared for Prescription Verification   Oxycodone                      3522         EXPECTED   ng/mg creat   Oxymorphone                    3675         EXPECTED   ng/mg creat   Noroxycodone                   >8475        EXPECTED   ng/mg creat   Noroxymorphone                 1600         EXPECTED   ng/mg creat    Sources of oxycodone are scheduled prescription medications.    Oxymorphone, noroxycodone, and noroxymorphone are expected    metabolites of oxycodone. Oxymorphone is also available as a    scheduled prescription medication. ==================================================================== Test                      Result    Flag   Units      Ref Range   Creatinine              118              mg/dL      >=20 ==================================================================== Declared Medications:  The flagging and interpretation on this report are based on the  following declared medications.  Unexpected results may arise from  inaccuracies in the declared medications.  **Note: The testing scope of this panel includes these medications:  Oxycodone  **Note: The testing  scope of this panel does not include the  following reported medications:  Caffeine  Cholecalciferol  Cranberry  Folic Acid  Magnesium  Multivitamin  Supplement (Prevagen) ==================================================================== For clinical consultation, please call [phone]. ====================================================================      ROS  Constitutional: Denies any fever or chills Gastrointestinal: No reported hemesis, hematochezia, vomiting, or acute GI distress Musculoskeletal:  Bilateral hip pain, bilateral knee pain, low back pain Neurological: No reported episodes of acute onset apraxia, aphasia, dysarthria, agnosia, amnesia, paralysis, loss of coordination, or loss of consciousness  Medication Review  Cranberry, Energy Booster, Oxycodone HCl, Vitamin D, and folic acid  History Review  Allergy: Ms. Dubose is allergic to  duloxetine, ether, and morphine. Drug: Ms. Venezia  reports no history of drug use. Alcohol:  reports no history of alcohol use. Tobacco:  reports that she has quit smoking. Her smoking use included cigarettes. She has never used smokeless tobacco. Social: Ms. Flinchbaugh  reports that she has quit smoking. Her smoking use included cigarettes. She has never used smokeless tobacco. She reports that she does not drink alcohol and does not use drugs. Medical:  has a past medical history of Depression. Surgical: Ms. Denunzio  has a past surgical history that includes Hip surgery (Right, 05/13/11 ); Knee surgery (Left, 10/10); Abdominal hysterectomy; Eye surgery; Breast lumpectomy; and resection of right carotid tumor. Family: family history includes Anemia in her mother; Arthritis in her brother, mother, and sister; Heart disease in her sister; Stroke in her mother.  Laboratory Chemistry Profile   Renal Lab Results  Component Value Date   BUN 12 05/25/2021   CREATININE 0.68 05/25/2021   BCR 22 04/13/2019   GFRAA >60 02/12/2020    GFRNONAA >60 05/25/2021     Hepatic Lab Results  Component Value Date   AST 22 05/25/2021   ALT 21 05/25/2021   ALBUMIN 4.2 05/25/2021   ALKPHOS 60 05/25/2021     Electrolytes Lab Results  Component Value Date   NA 138 05/25/2021   K 3.9 05/25/2021   CL 107 05/25/2021   CALCIUM 9.4 05/25/2021   PHOS 3.6 04/07/2017     Bone Lab Results  Component Value Date   VD25OH 17.2 (L) 04/08/2018     Inflammation (CRP: Acute Phase) (ESR: Chronic Phase) No results found for: CRP, ESRSEDRATE, LATICACIDVEN     Note: Above Lab results reviewed.   Physical Exam  General appearance: Well nourished, well developed, and well hydrated. In no apparent acute distress Mental status: Alert, oriented x 3 (person, place, & time)       Respiratory: No evidence of acute respiratory distress Eyes: PERLA Vitals: BP (!) 135/102    Pulse 93    Temp 98.2 F (36.8 C) (Temporal)    Resp 15    Ht 5' 2"  (1.575 m)    Wt 120 lb (54.4 kg)    SpO2 97%    BMI 21.95 kg/m  BMI: Estimated body mass index is 21.95 kg/m as calculated from the following:   Height as of this encounter: 5' 2"  (1.575 m).   Weight as of this encounter: 120 lb (54.4 kg). Ideal: Ideal body weight: 50.1 kg (110 lb 7.2 oz) Adjusted ideal body weight: 51.8 kg (114 lb 4.3 oz)  Bilateral shoulder pain, pain with abduction Limited hip flexion, patient is in wheelchair 4 out of 5 strength bilateral lower extremity: Plantar flexion, dorsiflexion, knee flexion, knee extension.  Assessment   Diagnosis  1. Chronic pain syndrome   2. Imbalance   3. CFIDS (chronic fatigue and immune dysfunction syndrome) ([facility])   4. Chronic osteoarthritis   5. History of left knee replacement   6. Moderate episode of recurrent major depressive disorder ([facility])   7. Paresthesia        Plan of Care    Pharmacotherapy (Medications Ordered): Meds ordered this encounter  Medications   Oxycodone HCl 10 MG TABS    Sig: Take 1 tablet (10 mg total) by mouth  every 12 (twelve) hours as needed.    Dispense:  60 tablet    Refill:  0   Oxycodone HCl 10 MG TABS    Sig: Take 1 tablet (10 mg total)  by mouth every 12 (twelve) hours as needed.    Dispense:  60 tablet    Refill:  0   Oxycodone HCl 10 MG TABS    Sig: Take 1 tablet (10 mg total) by mouth every 12 (twelve) hours as needed.    Dispense:  60 tablet    Refill:  0    Orders:  Orders Placed This Encounter  Procedures   ToxASSURE Select 13 (MW), Urine    Volume: 30 ml(s). Minimum 3 ml of urine is needed. Document temperature of fresh sample. Indications: Long term (current) use of opiate analgesic [phone])    Order Specific Question:   Release to patient    Answer:   Immediate    Follow-up plan:   Return for Patient will call when it is time for medication refill..    Recent Visits No visits were found meeting these conditions. Showing recent visits within past 90 days and meeting all other requirements Today's Visits Date Type Provider Dept  12/25/21 Office Visit Gillis Santa, MD [facility]  Showing today's visits and meeting all other requirements Future Appointments No visits were found meeting these conditions. Showing future appointments within next 90 days and meeting all other requirements  I discussed the assessment and treatment plan with the patient. The patient was provided an opportunity to ask questions and all were answered. The patient agreed with the plan and demonstrated an understanding of the instructions.  Patient advised to call back or seek an in-person evaluation if the symptoms or condition worsens.  Duration of encounter: 101mnutes.  Note by: BGillis Santa MD Date: 12/25/2021; Time: 11:29 AM

## 2022-01-06 ENCOUNTER — Encounter: Payer: Medicare Other | Admitting: Student in an Organized Health Care Education/Training Program

## 2022-04-21 ENCOUNTER — Encounter: Payer: Medicare Other | Admitting: Student in an Organized Health Care Education/Training Program

## 2022-04-30 ENCOUNTER — Encounter: Payer: Medicare Other | Admitting: Student in an Organized Health Care Education/Training Program

## 2023-02-11 ENCOUNTER — Emergency Department (HOSPITAL_COMMUNITY): Payer: Medicare Other

## 2023-02-11 ENCOUNTER — Inpatient Hospital Stay (HOSPITAL_COMMUNITY)
Admission: EM | Admit: 2023-02-11 | Discharge: 2023-02-15 | DRG: 660 | Disposition: A | Discharge status: Home / Self Care | Payer: Medicare Other | Attending: Internal Medicine | Admitting: Internal Medicine

## 2023-02-11 ENCOUNTER — Encounter (HOSPITAL_COMMUNITY): Payer: Self-pay

## 2023-02-11 DIAGNOSIS — E876 Hypokalemia: Secondary | ICD-10-CM | POA: Diagnosis present

## 2023-02-11 DIAGNOSIS — J90 Pleural effusion, not elsewhere classified: Secondary | ICD-10-CM

## 2023-02-11 DIAGNOSIS — K5730 Diverticulosis of large intestine without perforation or abscess without bleeding: Secondary | ICD-10-CM | POA: Diagnosis present

## 2023-02-11 DIAGNOSIS — N136 Pyonephrosis: Secondary | ICD-10-CM | POA: Diagnosis not present

## 2023-02-11 DIAGNOSIS — I700 Atherosclerosis of aorta: Secondary | ICD-10-CM | POA: Diagnosis present

## 2023-02-11 DIAGNOSIS — Z96642 Presence of left artificial hip joint: Secondary | ICD-10-CM | POA: Diagnosis present

## 2023-02-11 DIAGNOSIS — R059 Cough, unspecified: Secondary | ICD-10-CM | POA: Diagnosis present

## 2023-02-11 DIAGNOSIS — F32A Depression, unspecified: Secondary | ICD-10-CM | POA: Diagnosis present

## 2023-02-11 DIAGNOSIS — Z885 Allergy status to narcotic agent status: Secondary | ICD-10-CM

## 2023-02-11 DIAGNOSIS — E8770 Fluid overload, unspecified: Secondary | ICD-10-CM | POA: Diagnosis present

## 2023-02-11 DIAGNOSIS — K449 Diaphragmatic hernia without obstruction or gangrene: Secondary | ICD-10-CM | POA: Diagnosis present

## 2023-02-11 DIAGNOSIS — Z1152 Encounter for screening for COVID-19: Secondary | ICD-10-CM

## 2023-02-11 DIAGNOSIS — E871 Hypo-osmolality and hyponatremia: Secondary | ICD-10-CM | POA: Diagnosis present

## 2023-02-11 DIAGNOSIS — N179 Acute kidney failure, unspecified: Secondary | ICD-10-CM | POA: Diagnosis present

## 2023-02-11 DIAGNOSIS — D1809 Hemangioma of other sites: Secondary | ICD-10-CM | POA: Diagnosis present

## 2023-02-11 DIAGNOSIS — Z1619 Resistance to other specified beta lactam antibiotics: Secondary | ICD-10-CM | POA: Diagnosis present

## 2023-02-11 DIAGNOSIS — R600 Localized edema: Secondary | ICD-10-CM | POA: Diagnosis present

## 2023-02-11 DIAGNOSIS — Z79899 Other long term (current) drug therapy: Secondary | ICD-10-CM

## 2023-02-11 DIAGNOSIS — K7689 Other specified diseases of liver: Secondary | ICD-10-CM | POA: Diagnosis present

## 2023-02-11 DIAGNOSIS — G894 Chronic pain syndrome: Secondary | ICD-10-CM | POA: Diagnosis present

## 2023-02-11 DIAGNOSIS — N202 Calculus of kidney with calculus of ureter: Secondary | ICD-10-CM | POA: Diagnosis present

## 2023-02-11 DIAGNOSIS — Z8249 Family history of ischemic heart disease and other diseases of the circulatory system: Secondary | ICD-10-CM

## 2023-02-11 DIAGNOSIS — N390 Urinary tract infection, site not specified: Secondary | ICD-10-CM

## 2023-02-11 DIAGNOSIS — N200 Calculus of kidney: Secondary | ICD-10-CM

## 2023-02-11 DIAGNOSIS — Z90710 Acquired absence of both cervix and uterus: Secondary | ICD-10-CM

## 2023-02-11 DIAGNOSIS — R809 Proteinuria, unspecified: Secondary | ICD-10-CM | POA: Diagnosis present

## 2023-02-11 DIAGNOSIS — R7401 Elevation of levels of liver transaminase levels: Secondary | ICD-10-CM

## 2023-02-11 DIAGNOSIS — E8809 Other disorders of plasma-protein metabolism, not elsewhere classified: Secondary | ICD-10-CM | POA: Diagnosis present

## 2023-02-11 DIAGNOSIS — Z87442 Personal history of urinary calculi: Secondary | ICD-10-CM

## 2023-02-11 DIAGNOSIS — Z87891 Personal history of nicotine dependence: Secondary | ICD-10-CM

## 2023-02-11 DIAGNOSIS — Z888 Allergy status to other drugs, medicaments and biological substances status: Secondary | ICD-10-CM

## 2023-02-11 HISTORY — DX: Unspecified osteoarthritis, unspecified site: M19.90

## 2023-02-11 LAB — CBC WITH DIFFERENTIAL/PLATELET
Abs Immature Granulocytes: 0.06 10*3/uL (ref 0.00–0.07)
Basophils Absolute: 0 10*3/uL (ref 0.0–0.1)
Basophils Relative: 0 %
Eosinophils Absolute: 0.1 10*3/uL (ref 0.0–0.5)
Eosinophils Relative: 1 %
HCT: 40.6 % (ref 36.0–46.0)
Hemoglobin: 13.7 g/dL (ref 12.0–15.0)
Immature Granulocytes: 1 %
Lymphocytes Relative: 15 %
Lymphs Abs: 1.1 10*3/uL (ref 0.7–4.0)
MCH: 33.4 pg (ref 26.0–34.0)
MCHC: 33.7 g/dL (ref 30.0–36.0)
MCV: 99 fL (ref 80.0–100.0)
Monocytes Absolute: 0.6 10*3/uL (ref 0.1–1.0)
Monocytes Relative: 8 %
Neutro Abs: 5.4 10*3/uL (ref 1.7–7.7)
Neutrophils Relative %: 75 %
Platelets: 306 10*3/uL (ref 150–400)
RBC: 4.1 MIL/uL (ref 3.87–5.11)
RDW: 12.7 % (ref 11.5–15.5)
WBC: 7.2 10*3/uL (ref 4.0–10.5)
nRBC: 0 % (ref 0.0–0.2)

## 2023-02-11 LAB — COMPREHENSIVE METABOLIC PANEL
ALT: 38 U/L (ref 0–44)
AST: 42 U/L — ABNORMAL HIGH (ref 15–41)
Albumin: 2.2 g/dL — ABNORMAL LOW (ref 3.5–5.0)
Alkaline Phosphatase: 96 U/L (ref 38–126)
Anion gap: 14 (ref 5–15)
BUN: 33 mg/dL — ABNORMAL HIGH (ref 8–23)
CO2: 22 mmol/L (ref 22–32)
Calcium: 8.6 mg/dL — ABNORMAL LOW (ref 8.9–10.3)
Chloride: 96 mmol/L — ABNORMAL LOW (ref 98–111)
Creatinine, Ser: 1.62 mg/dL — ABNORMAL HIGH (ref 0.44–1.00)
GFR, Estimated: 30 mL/min — ABNORMAL LOW (ref >60)
Glucose, Bld: 106 mg/dL — ABNORMAL HIGH (ref 70–99)
Potassium: 3.8 mmol/L (ref 3.5–5.1)
Sodium: 132 mmol/L — ABNORMAL LOW (ref 135–145)
Total Bilirubin: 0.8 mg/dL (ref 0.3–1.2)
Total Protein: 6.1 g/dL — ABNORMAL LOW (ref 6.5–8.1)

## 2023-02-11 LAB — URINALYSIS, ROUTINE W REFLEX MICROSCOPIC
Bilirubin Urine: NEGATIVE
Glucose, UA: NEGATIVE mg/dL
Ketones, ur: NEGATIVE mg/dL
Nitrite: NEGATIVE
Protein, ur: 100 mg/dL — AB
Specific Gravity, Urine: 1.013 (ref 1.005–1.030)
WBC, UA: >50 WBC/hpf (ref 0–5)
pH: 5 (ref 5.0–8.0)

## 2023-02-11 LAB — BRAIN NATRIURETIC PEPTIDE: B Natriuretic Peptide: 98.2 pg/mL (ref 0.0–100.0)

## 2023-02-11 MED ORDER — FUROSEMIDE 10 MG/ML IJ SOLN
40.0000 mg | Freq: Once | INTRAMUSCULAR | Status: AC
  Administered 2023-02-12: 40 mg via INTRAVENOUS
  Filled 2023-02-11: qty 4

## 2023-02-11 MED ORDER — SODIUM CHLORIDE 0.9 % IV SOLN
1.0000 g | Freq: Once | INTRAVENOUS | Status: AC
  Administered 2023-02-12: 1 g via INTRAVENOUS
  Filled 2023-02-11: qty 10

## 2023-02-11 NOTE — ED Provider Triage Note (Signed)
Emergency Medicine Provider Triage Evaluation Note  Angela Young , a 89 y.o. female  was evaluated in triage.  Pt complains of leg swelling. Endorse body aches, dry heaving, sob, and leg swelling x 3-4 days.  No fever, chills, dysuria or cough  Review of Systems  Positive: As above Negative: As above  Physical Exam  BP 128/83 (BP Location: Left Arm)   Pulse (!) 101   Temp 98.6 F (37 C) (Oral)   Resp 18   Ht 5\' 2"  (1.575 m)   Wt 54.4 kg   SpO2 99%   BMI 21.95 kg/m  Gen:   Awake, no distress   Resp:  Normal effort  MSK:   Moves extremities without difficulty  Other:    Medical Decision Making  Medically screening exam initiated at 4:25 PM.  Appropriate orders placed.  Angela Young was informed that the remainder of the evaluation will be completed by another provider, this initial triage assessment does not replace that evaluation, and the importance of remaining in the ED until their evaluation is complete.     Fayrene Helper, PA-C 02/11/23 1628

## 2023-02-11 NOTE — ED Provider Notes (Signed)
[place] EMERGENCY DEPARTMENT AT [facility] Provider Note   CSN: [id number] Arrival date & time: 02/11/23  1515     History  No chief complaint on file.   Angela Young is a 89 y.o. female.  The history is provided by the patient and a relative.  She comes in because of swelling in her feet over the last 3 days.  She has felt sick for about the last 2 weeks.  She initially had vomiting and diarrhea, both of which have subsided.  She is complaining of some generalized bodyaches.  She denies fever, chills, sweats.  She has had some dyspnea but denies chest pain, heaviness, tightness, pressure.  She does endorse a mild cough which is nonproductive.  She had 1 day where she felt like there was some pain in her bladder and she took phenazopyridine, and then did not urinate for the next 2 days.  She has resumed urinating.  She does admit that she has been taking some ibuprofen for her aching.  She states that she has been hesitant to eat solid food because she is worried that it will trigger her diarrhea to restart.   Home Medications Prior to Admission medications   Medication Sig Start Date End Date Taking? Authorizing Provider  Cholecalciferol (VITAMIN D) 2000 UNITS CAPS Take by mouth. 12/07/11   [provider]  Cranberry 1000 MG CAPS Take by mouth as needed. occsaionally     [provider]  folic acid (FOLVITE) 400 MCG tablet Take 400 mcg by mouth daily.    [provider]  Multiple Vitamins-Minerals (ENERGY BOOSTER) PACK Take by mouth. occasionally    [provider]      Allergies    Duloxetine, Ether, and Morphine    Review of Systems   Review of Systems  All other systems reviewed and are negative.   Physical Exam Updated Vital Signs BP 108/63   Pulse (!) 119   Temp 97.7 F (36.5 C)   Resp 16   Ht  (1.575 m)   Wt 54.4 kg   SpO2 95%   BMI 21.95 kg/m  Physical Exam Vitals and nursing note reviewed.   89 year old  female, resting comfortably and in no acute distress. Vital signs are significant for elevated heart rate. Oxygen saturation is 99%, which is normal. Head is normocephalic and atraumatic. PERRLA, EOMI. Oropharynx is clear. Neck is nontender and supple without adenopathy or JVD. Back is nontender and there is no CVA tenderness.  There is 1+ presacral edema. Lungs are clear without rales, wheezes, or rhonchi. Chest is nontender. Heart has a borderline tachycardic and slightly irregular rhythm without murmur. Abdomen is soft, flat, nontender. Extremities have 3+ pretibial and pedal edema.  There is delayed capillary refill of 4-6 seconds bilaterally. Skin is warm and dry without rash. Neurologic: Mental status is normal, cranial nerves are intact, moves all extremities equally.  ED Results / Procedures / Treatments   Labs (all labs ordered are listed, but only abnormal results are displayed) Labs Reviewed  COMPREHENSIVE METABOLIC PANEL - Abnormal; Notable for the following components:      Result Value   Sodium 132 (*)    Chloride 96 (*)    Glucose, Bld 106 (*)    BUN 33 (*)    Creatinine, Ser 1.62 (*)    Calcium 8.6 (*)    Total Protein 6.1 (*)    Albumin 2.2 (*)    AST 42 (*)  GFR, Estimated 30 (*)    All other components within normal limits  URINALYSIS, ROUTINE W REFLEX MICROSCOPIC - Abnormal; Notable for the following components:   Color, Urine AMBER (*)    APPearance CLOUDY (*)    Hgb urine dipstick MODERATE (*)    Protein, ur 100 (*)    Leukocytes,Ua LARGE (*)    Bacteria, UA MANY (*)    All other components within normal limits  SARS CORONAVIRUS 2 BY RT PCR  CBC WITH DIFFERENTIAL/PLATELET  BRAIN NATRIURETIC PEPTIDE    EKG EKG Interpretation  Date/Time:  Thursday February 11 2023 16:11:28 EDT Ventricular Rate:  102 PR Interval:  152 QRS Duration: 70 QT Interval:  348 QTC Calculation: 453 R Axis:   60 Text Interpretation: Sinus tachycardia with occasional  Premature ventricular complexes and Premature atrial complexes Septal infarct , age undetermined Abnormal ECG When compared with ECG of 25-Jun-2008 09:34, When compared with ECG of 06/25/2008, Premature ventricular complexes are now present Premature atrial complexes are now present Confirmed by Dione Booze ([id number]) on 02/11/2023 11:26:30 PM  Radiology DG Chest 2 View  Result Date: 02/11/2023 CLINICAL DATA:  Shortness of breath EXAM: CHEST - 2 VIEW COMPARISON:  None Available. FINDINGS: Small bilateral pleural effusions. Patchy airspace disease at the right base. Borderline cardiomegaly with aortic atherosclerosis. No pneumothorax. IMPRESSION: Small bilateral pleural effusions with patchy airspace disease at the right base which may be due to atelectasis or pneumonia. Borderline cardiomegaly. Electronically Signed   By: Jasmine Pang M.D.   On: 02/11/2023 17:32    Procedures Procedures  Cardiac monitor shows sinus tachycardia with occasional PACs, per my interpretation.  Medications Ordered in ED Medications - No data to display  ED Course/ Medical Decision Making/ A&P                             Medical Decision Making Amount and/or Complexity of Data Reviewed Labs: ordered.  Risk Prescription drug management. Decision regarding hospitalization.   Peripheral edema of uncertain cause.  Consider heart failure, renal failure, hepatic failure, hypoalbuminemia.  Recent episode of vomiting and diarrhea which seems to have resolved, etiology unclear, unlikely to find cause at this point.  I have reviewed and interpreted her electrocardiogram, and my interpretation is sinus tachycardia with PACs and PVCs which are new compared with prior ECG 06/25/2008.  Chest x-ray shows borderline cardiomegaly, small right pleural effusion, patchy airspace disease which is likely atelectasis.  I have independently viewed the images, and agree with radiologist's interpretation.  I have reviewed and interpreted her  laboratory tests and my interpretation is as follows: Urinalysis consistent with UTI with greater than 50 WBCs and many bacteria, also proteinuria noted.  Normal CBC.  Metabolic panel significant for mild hyponatremia which is not felt to be clinically significant, borderline elevated random glucose which will need to be followed as an outpatient, elevated BUN and creatinine with significant change compared with most recent value on record of 05/25/2021, moderately severe hypoalbuminemia, borderline elevated AST of doubtful clinical significance.  BNP is normal, significant heart failure is unlikely.  With these findings, edema seems most likely from a combination of renal failure and hypoalbuminemia.  I have reviewed her results on care everywhere and on 05/21/2022 creatinine was 0.60 and BUN 14 compared with creatinine 1.62 and BUN 33 today.  Kidney injury is likely multifactorial with combination of dehydration from episodes of vomiting and diarrhea as well as impact from NSAID use.  I have ordered a dose of ceftriaxone for her urinary tract infection, and I have ordered a dose of furosemide for her edema.  She will need to be admitted to have appropriate diuresis as well as careful monitoring of her renal function.  I have discussed the case with Dr. Julian Reil of [facility], who agrees to admit the patient.  Final Clinical Impression(s) / ED Diagnoses Final diagnoses:  Acute kidney injury (nontraumatic)  Peripheral edema  Hypoalbuminemia  Pleural effusion on right  Elevated AST (SGOT)  Hyponatremia    Rx / DC Orders ED Discharge Orders     None         Dione Booze, MD 02/12/23 [phone]

## 2023-02-11 NOTE — ED Provider Notes (Incomplete)
[place] [facility] AT [facility] Provider Note   CSN: [id number] Arrival date & time: 02/11/23  1515     History {Add pertinent medical, surgical, social history, OB history to HPI:1} No chief complaint on file.   Angela Young is a 89 y.o. female.  The history is provided by the patient and a relative.  She comes in because of swelling in her feet over the last 3 days.  She has felt sick for about the last 2 weeks.  She initially had vomiting and diarrhea, both of which have subsided.  She is complaining of some generalized bodyaches.  She denies fever, chills, sweats.  She has had some dyspnea but denies chest pain, heaviness, tightness, pressure.  She does endorse a mild cough which is nonproductive.  She had 1 day where she felt like there was some pain in her bladder and she took phenazopyridine, and then did not urinate for the next 2 days.  She has resumed urinating.  She does admit that she has been taking some ibuprofen for her aching.  She states that she has been hesitant to eat solid food because she is worried that it will trigger her diarrhea to restart.   Home Medications Prior to Admission medications   Medication Sig Start Date End Date Taking? Authorizing Provider  Cholecalciferol (VITAMIN D) 2000 UNITS CAPS Take by mouth. 12/07/11   [provider]  Cranberry 1000 MG CAPS Take by mouth as needed. occsaionally     [provider]  folic acid (FOLVITE) 400 MCG tablet Take 400 mcg by mouth daily.    [provider]  Multiple Vitamins-Minerals (ENERGY BOOSTER) PACK Take by mouth. occasionally    [provider]      Allergies    Duloxetine, Ether, and Morphine    Review of Systems   Review of Systems  All other systems reviewed and are negative.   Physical Exam Updated Vital Signs BP 108/63   Pulse (!) 119   Temp 97.7 F (36.5 C)   Resp 16   Ht 5\' 2"  (1.575 m)   Wt 54.4 kg   SpO2 95%   BMI 21.95  kg/m  Physical Exam Vitals and nursing note reviewed.   89 year old female, resting comfortably and in no acute distress. Vital signs are significant for elevated heart rate. Oxygen saturation is 99%, which is normal. Head is normocephalic and atraumatic. PERRLA, EOMI. Oropharynx is clear. Neck is nontender and supple without adenopathy or JVD. Back is nontender and there is no CVA tenderness.  There is 1+ presacral edema. Lungs are clear without rales, wheezes, or rhonchi. Chest is nontender. Heart has a borderline tachycardic and slightly irregular rhythm without murmur. Abdomen is soft, flat, nontender. Extremities have 3+ pretibial and pedal edema.  There is delayed capillary refill of 4-6 seconds bilaterally. Skin is warm and dry without rash. Neurologic: Mental status is normal, cranial nerves are intact, moves all extremities equally.  ED Results / Procedures / Treatments   Labs (all labs ordered are listed, but only abnormal results are displayed) Labs Reviewed  COMPREHENSIVE METABOLIC PANEL - Abnormal; Notable for the following components:      Result Value   Sodium 132 (*)    Chloride 96 (*)    Glucose, Bld 106 (*)    BUN 33 (*)    Creatinine, Ser 1.62 (*)    Calcium 8.6 (*)    Total Protein 6.1 (*)    Albumin  2.2 (*)    AST 42 (*)    GFR, Estimated 30 (*)    All other components within normal limits  URINALYSIS, ROUTINE W REFLEX MICROSCOPIC - Abnormal; Notable for the following components:   Color, Urine AMBER (*)    APPearance CLOUDY (*)    Hgb urine dipstick MODERATE (*)    Protein, ur 100 (*)    Leukocytes,Ua LARGE (*)    Bacteria, UA MANY (*)    All other components within normal limits  SARS CORONAVIRUS 2 BY RT PCR  CBC WITH DIFFERENTIAL/PLATELET  BRAIN NATRIURETIC PEPTIDE    EKG EKG Interpretation  Date/Time:  Thursday February 11 2023 16:11:28 EDT Ventricular Rate:  102 PR Interval:  152 QRS Duration: 70 QT Interval:  348 QTC Calculation: 453 R  Axis:   60 Text Interpretation: Sinus tachycardia with occasional Premature ventricular complexes and Premature atrial complexes Septal infarct , age undetermined Abnormal ECG When compared with ECG of 25-Jun-2008 09:34, When compared with ECG of 06/25/2008, Premature ventricular complexes are now present Premature atrial complexes are now present Confirmed by Dione Booze ([id number]) on 02/11/2023 11:26:30 PM  Radiology DG Chest 2 View  Result Date: 02/11/2023 CLINICAL DATA:  Shortness of breath EXAM: CHEST - 2 VIEW COMPARISON:  None Available. FINDINGS: Small bilateral pleural effusions. Patchy airspace disease at the right base. Borderline cardiomegaly with aortic atherosclerosis. No pneumothorax. IMPRESSION: Small bilateral pleural effusions with patchy airspace disease at the right base which may be due to atelectasis or pneumonia. Borderline cardiomegaly. Electronically Signed   By: Jasmine Pang M.D.   On: 02/11/2023 17:32    Procedures Procedures  Cardiac monitor shows sinus tachycardia with occasional PACs, per my interpretation.  Medications Ordered in ED Medications - No data to display  ED Course/ Medical Decision Making/ A&P   {   Click here for ABCD2, HEART and other calculatorsREFRESH Note before signing :1}                          Medical Decision Making  Peripheral edema of uncertain cause.  Consider heart failure, renal failure, hepatic failure, hypoalbuminemia.  Recent episode of vomiting and diarrhea which seems to have resolved, etiology unclear, unlikely to find cause at this point.  I have reviewed and interpreted her electrocardiogram, and my interpretation is sinus tachycardia with PACs and PVCs which are new compared with prior ECG 06/25/2008.  Chest x-ray shows borderline cardiomegaly, small right pleural effusion, patchy airspace disease which is likely atelectasis.  I have independently viewed the images, and agree with radiologist's interpretation.  I have reviewed and  interpreted her laboratory tests and my interpretation is as follows: Urinalysis consistent with UTI with greater than 50 WBCs and many bacteria, also proteinuria noted.  Normal CBC.  Metabolic panel significant for mild hyponatremia which is not felt to be clinically significant, borderline elevated random glucose which will need to be followed as an outpatient, elevated BUN and creatinine with significant  {Document critical care time when appropriate:1} {Document review of labs and clinical decision tools ie heart score, Chads2Vasc2 etc:1}  {Document your independent review of radiology images, and any outside records:1} {Document your discussion with family members, caretakers, and with consultants:1} {Document social determinants of health affecting pt's care:1} {Document your decision making why or why not admission, treatments were needed:1} Final Clinical Impression(s) / ED Diagnoses Final diagnoses:  None    Rx / DC Orders ED Discharge Orders  None       

## 2023-02-11 NOTE — ED Triage Notes (Signed)
Pt c/o dry heaves and diarrhea x 1 week; endorses generalized soreness; endorses some sob; swelling to bilateral LE x a few days; denies fevers

## 2023-02-12 ENCOUNTER — Other Ambulatory Visit: Payer: Self-pay

## 2023-02-12 ENCOUNTER — Observation Stay (HOSPITAL_COMMUNITY): Payer: Medicare Other

## 2023-02-12 DIAGNOSIS — E8809 Other disorders of plasma-protein metabolism, not elsewhere classified: Secondary | ICD-10-CM | POA: Diagnosis present

## 2023-02-12 DIAGNOSIS — G894 Chronic pain syndrome: Secondary | ICD-10-CM | POA: Diagnosis present

## 2023-02-12 DIAGNOSIS — Z87442 Personal history of urinary calculi: Secondary | ICD-10-CM | POA: Diagnosis not present

## 2023-02-12 DIAGNOSIS — F32A Depression, unspecified: Secondary | ICD-10-CM | POA: Diagnosis present

## 2023-02-12 DIAGNOSIS — Z87891 Personal history of nicotine dependence: Secondary | ICD-10-CM | POA: Diagnosis not present

## 2023-02-12 DIAGNOSIS — Z1619 Resistance to other specified beta lactam antibiotics: Secondary | ICD-10-CM | POA: Diagnosis present

## 2023-02-12 DIAGNOSIS — N179 Acute kidney failure, unspecified: Secondary | ICD-10-CM

## 2023-02-12 DIAGNOSIS — E871 Hypo-osmolality and hyponatremia: Secondary | ICD-10-CM | POA: Diagnosis present

## 2023-02-12 DIAGNOSIS — R059 Cough, unspecified: Secondary | ICD-10-CM | POA: Diagnosis present

## 2023-02-12 DIAGNOSIS — Z96642 Presence of left artificial hip joint: Secondary | ICD-10-CM | POA: Diagnosis present

## 2023-02-12 DIAGNOSIS — Z1152 Encounter for screening for COVID-19: Secondary | ICD-10-CM | POA: Diagnosis not present

## 2023-02-12 DIAGNOSIS — N201 Calculus of ureter: Secondary | ICD-10-CM

## 2023-02-12 DIAGNOSIS — K449 Diaphragmatic hernia without obstruction or gangrene: Secondary | ICD-10-CM | POA: Diagnosis present

## 2023-02-12 DIAGNOSIS — J90 Pleural effusion, not elsewhere classified: Secondary | ICD-10-CM | POA: Diagnosis present

## 2023-02-12 DIAGNOSIS — K5730 Diverticulosis of large intestine without perforation or abscess without bleeding: Secondary | ICD-10-CM | POA: Diagnosis present

## 2023-02-12 DIAGNOSIS — N202 Calculus of kidney with calculus of ureter: Secondary | ICD-10-CM | POA: Diagnosis present

## 2023-02-12 DIAGNOSIS — I5031 Acute diastolic (congestive) heart failure: Secondary | ICD-10-CM

## 2023-02-12 DIAGNOSIS — R600 Localized edema: Secondary | ICD-10-CM | POA: Diagnosis not present

## 2023-02-12 DIAGNOSIS — Z90710 Acquired absence of both cervix and uterus: Secondary | ICD-10-CM | POA: Diagnosis not present

## 2023-02-12 DIAGNOSIS — R809 Proteinuria, unspecified: Secondary | ICD-10-CM | POA: Diagnosis present

## 2023-02-12 DIAGNOSIS — K7689 Other specified diseases of liver: Secondary | ICD-10-CM | POA: Diagnosis present

## 2023-02-12 DIAGNOSIS — E8770 Fluid overload, unspecified: Secondary | ICD-10-CM | POA: Diagnosis present

## 2023-02-12 DIAGNOSIS — N200 Calculus of kidney: Secondary | ICD-10-CM

## 2023-02-12 DIAGNOSIS — N136 Pyonephrosis: Secondary | ICD-10-CM | POA: Diagnosis present

## 2023-02-12 DIAGNOSIS — D1809 Hemangioma of other sites: Secondary | ICD-10-CM | POA: Diagnosis present

## 2023-02-12 DIAGNOSIS — E876 Hypokalemia: Secondary | ICD-10-CM | POA: Diagnosis present

## 2023-02-12 DIAGNOSIS — I700 Atherosclerosis of aorta: Secondary | ICD-10-CM | POA: Diagnosis present

## 2023-02-12 LAB — ECHOCARDIOGRAM COMPLETE
Area-P 1/2: 3.43 cm2
Calc EF: 67 %
Height: 62 in
MV VTI: 2.66 cm2
S' Lateral: 2 cm
Single Plane A2C EF: 61.5 %
Single Plane A4C EF: 68.5 %
Weight: 1920 oz

## 2023-02-12 LAB — SARS CORONAVIRUS 2 BY RT PCR: SARS Coronavirus 2 by RT PCR: NEGATIVE

## 2023-02-12 MED ORDER — ACETAMINOPHEN 325 MG PO TABS
650.0000 mg | ORAL_TABLET | Freq: Four times a day (QID) | ORAL | Status: DC | PRN
  Administered 2023-02-14: 650 mg via ORAL
  Filled 2023-02-12 (×2): qty 2

## 2023-02-12 MED ORDER — TAMSULOSIN HCL 0.4 MG PO CAPS
0.4000 mg | ORAL_CAPSULE | Freq: Every day | ORAL | Status: DC
  Administered 2023-02-12 – 2023-02-14 (×3): 0.4 mg via ORAL
  Filled 2023-02-12 (×3): qty 1

## 2023-02-12 MED ORDER — SODIUM CHLORIDE 0.9 % IV SOLN
1.0000 g | INTRAVENOUS | Status: DC
  Administered 2023-02-12: 1 g via INTRAVENOUS
  Filled 2023-02-12: qty 10

## 2023-02-12 MED ORDER — HEPARIN SODIUM (PORCINE) 5000 UNIT/ML IJ SOLN
5000.0000 [IU] | Freq: Three times a day (TID) | INTRAMUSCULAR | Status: DC
  Administered 2023-02-13 – 2023-02-15 (×6): 5000 [IU] via SUBCUTANEOUS
  Filled 2023-02-12 (×6): qty 1

## 2023-02-12 MED ORDER — ONDANSETRON HCL 4 MG PO TABS: 4.0000 mg | ORAL_TABLET | Freq: Four times a day (QID) | ORAL | Status: DC | PRN

## 2023-02-12 MED ORDER — OXYCODONE HCL 5 MG PO TABS
10.0000 mg | ORAL_TABLET | ORAL | Status: DC | PRN
  Administered 2023-02-12 – 2023-02-13 (×4): 10 mg via ORAL
  Filled 2023-02-12 (×5): qty 2

## 2023-02-12 MED ORDER — ACETAMINOPHEN 650 MG RE SUPP: 650.0000 mg | Freq: Four times a day (QID) | RECTAL | Status: DC | PRN

## 2023-02-12 MED ORDER — ONDANSETRON HCL 4 MG/2ML IJ SOLN: 4.0000 mg | Freq: Four times a day (QID) | INTRAMUSCULAR | Status: DC | PRN

## 2023-02-12 NOTE — H&P (View-Only) (Signed)
Urology Consult  Referring physician: Dr. Roda Shutters Reason for referral: right ureteral calculus, AKI  Chief Complaint: abdominal pain  History of Present Illness: Angela Young is a 89yo who was admitted with abdominal pain and AKI. Angela Young has been having intermittent mild to moderate abdominal pain for 3 weeks. Angela Young had associated urinary urgency and frequency. Angela Young also had diarrhea during this time. Angela Young has been treated twice in the past month for a UTI. No prior history of nephrolithiasis. He abdominal pain worsened over the past week which brought Angela Young to the ER. Currently Angela Young pain is well controlled. Angela Young had an increase in Angela Young creatinine to 1.6 up from a baseline of 0.9. Ct obtained in the ER shows a 6mm right distal ureteral calculus with mild to moderate hydronephrosis. Angela Young denies nay fevers. WBC count normal. UA shows WBCs and bacteria. Angela Young denies any significant LUTS currently.   Past Medical History:  Diagnosis Date   Arthritis    Depression    Past Surgical History:  Procedure Laterality Date   ABDOMINAL HYSTERECTOMY     BREAST LUMPECTOMY     EYE SURGERY     due to trauma   HIP SURGERY Right 05/13/11    total hip replacement dr Despina Hick; left 07/11/08 Dr Ernest Pine   KNEE SURGERY Left 10/10   resection of right carotid tumor     benign per pt    Medications: I have reviewed the patient's current medications. Allergies:  Allergies  Allergen Reactions   Duloxetine Other (See Comments)   Ether    Morphine Other (See Comments)    Family History  Problem Relation Age of Onset   Arthritis Mother    Anemia Mother    Stroke Mother    Arthritis Sister    Heart disease Sister    Arthritis Brother    Social History:  reports that Angela Young has quit smoking. Angela Young smoking use included cigarettes. Angela Young has never used smokeless tobacco. Angela Young reports that Angela Young does not drink alcohol and does not use drugs.  Review of Systems  Gastrointestinal:  Positive for abdominal pain.  Genitourinary:  Positive for  frequency and urgency.  All other systems reviewed and are negative.   Physical Exam:  Vital signs in last 24 hours: Temp:  [97.6 F (36.4 C)-98 F (36.7 C)] 97.6 F (36.4 C) (04/12 2052) Pulse Rate:  [53-105] 101 (04/12 2052) Resp:  [12-24] 18 (04/12 2052) BP: (103-141)/(57-99) 103/57 (04/12 2052) SpO2:  [81 %-100 %] 81 % (04/12 2052) Physical Exam Vitals reviewed.  Constitutional:      Appearance: Normal appearance.  HENT:     Head: Normocephalic and atraumatic.     Mouth/Throat:     Mouth: Mucous membranes are dry.  Eyes:     Extraocular Movements: Extraocular movements intact.     Pupils: Pupils are equal, round, and reactive to light.  Cardiovascular:     Rate and Rhythm: Normal rate and regular rhythm.  Pulmonary:     Effort: Pulmonary effort is normal. No respiratory distress.  Abdominal:     General: Abdomen is flat. There is no distension.  Musculoskeletal:     Cervical back: Normal range of motion and neck supple.  Skin:    General: Skin is warm and dry.  Neurological:     General: No focal deficit present.     Mental Status: Angela Young is alert and oriented to person, place, and time.  Psychiatric:        Mood and Affect: Mood normal.  Behavior: Behavior normal.     Laboratory Data:  Results for orders placed or performed during the hospital encounter of 02/11/23 (from the past 72 hour(s))  Brain natriuretic peptide     Status: None   Collection Time: 02/11/23  4:28 PM  Result Value Ref Range   B Natriuretic Peptide 98.2 0.0 - 100.0 pg/mL    Comment: Performed at [facility], [address], [place], [place] [address]  CBC with Differential     Status: None   Collection Time: 02/11/23  4:30 PM  Result Value Ref Range   WBC 7.2 4.0 - 10.5 K/uL   RBC 4.10 3.87 - 5.11 MIL/uL   Hemoglobin 13.7 12.0 - 15.0 g/dL   HCT 16.9 45.0 - 38.8 %   MCV 99.0 80.0 - 100.0 fL   MCH 33.4 26.0 - 34.0 pg   MCHC 33.7 30.0 - 36.0 g/dL   RDW 82.8 00.3 - 49.1 %    Platelets 306 150 - 400 K/uL   nRBC 0.0 0.0 - 0.2 %   Neutrophils Relative % 75 %   Neutro Abs 5.4 1.7 - 7.7 K/uL   Lymphocytes Relative 15 %   Lymphs Abs 1.1 0.7 - 4.0 K/uL   Monocytes Relative 8 %   Monocytes Absolute 0.6 0.1 - 1.0 K/uL   Eosinophils Relative 1 %   Eosinophils Absolute 0.1 0.0 - 0.5 K/uL   Basophils Relative 0 %   Basophils Absolute 0.0 0.0 - 0.1 K/uL   Immature Granulocytes 1 %   Abs Immature Granulocytes 0.06 0.00 - 0.07 K/uL    Comment: Performed at [facility], [address] [place], [place] [address]  Comprehensive metabolic panel     Status: Abnormal   Collection Time: 02/11/23  4:30 PM  Result Value Ref Range   Sodium 132 (L) 135 - 145 mmol/L   Potassium 3.8 3.5 - 5.1 mmol/L   Chloride 96 (L) 98 - 111 mmol/L   CO2 22 22 - 32 mmol/L   Glucose, Bld 106 (H) 70 - 99 mg/dL    Comment: Glucose reference range applies only to samples taken after fasting for at least 8 hours.   BUN 33 (H) 8 - 23 mg/dL   Creatinine, Ser 5.69 (H) 0.44 - 1.00 mg/dL   Calcium 8.6 (L) 8.9 - 10.3 mg/dL   Total Protein 6.1 (L) 6.5 - 8.1 g/dL   Albumin 2.2 (L) 3.5 - 5.0 g/dL   AST 42 (H) 15 - 41 U/L   ALT 38 0 - 44 U/L   Alkaline Phosphatase 96 38 - 126 U/L   Total Bilirubin 0.8 0.3 - 1.2 mg/dL   GFR, Estimated 30 (L) >60 mL/min    Comment: (NOTE) Calculated using the CKD-EPI Creatinine Equation (2021)    Anion gap 14 5 - 15    Comment: Performed at [facility], [address] [place], [place] [address]  Urinalysis, Routine w reflex microscopic -Urine, Clean Catch     Status: Abnormal   Collection Time: 02/11/23  7:38 PM  Result Value Ref Range   Color, Urine AMBER (A) YELLOW    Comment: BIOCHEMICALS MAY BE AFFECTED BY COLOR   APPearance CLOUDY (A) CLEAR   Specific Gravity, Urine 1.013 1.005 - 1.030   pH 5.0 5.0 - 8.0   Glucose, UA NEGATIVE NEGATIVE mg/dL   Hgb urine dipstick MODERATE (A) NEGATIVE   Bilirubin Urine NEGATIVE NEGATIVE   Ketones, ur NEGATIVE  NEGATIVE mg/dL  Protein, ur 100 (A) NEGATIVE mg/dL   Nitrite NEGATIVE NEGATIVE   Leukocytes,Ua LARGE (A) NEGATIVE   RBC / HPF 11-20 0 - 5 RBC/hpf   WBC, UA >50 0 - 5 WBC/hpf   Bacteria, UA MANY (A) NONE SEEN   Squamous Epithelial / HPF 0-5 0 - 5 /HPF   Mucus PRESENT     Comment: Performed at [facility], [address] [place], [place] [address]  SARS Coronavirus 2 by RT PCR (hospital order, performed in [facility] hospital lab) *cepheid single result test* Anterior Nasal Swab     Status: None   Collection Time: 02/11/23 11:30 PM   Specimen: Anterior Nasal Swab  Result Value Ref Range   SARS Coronavirus 2 by RT PCR NEGATIVE NEGATIVE    Comment: Performed at [facility], [address], [place], [place] [address]   Recent Results (from the past 240 hour(s))  SARS Coronavirus 2 by RT PCR (hospital order, performed in [facility] hospital lab) *cepheid single result test* Anterior Nasal Swab     Status: None   Collection Time: 02/11/23 11:30 PM   Specimen: Anterior Nasal Swab  Result Value Ref Range Status   SARS Coronavirus 2 by RT PCR NEGATIVE NEGATIVE Final    Comment: Performed at [facility], [address] [place], [place] [address]   Creatinine: Recent Labs    02/11/23 1630  CREATININE 1.62*   Baseline Creatinine: 0.9  Impression/Assessment:  89yo with right ureteral calculus  Plan:  -We discussed the management of kidney stones. These options include observation, ureteroscopy, shockwave lithotripsy (ESWL) and percutaneous nephrolithotomy (PCNL). We discussed which options are relevant to the patient's stone(s). We discussed the natural history of kidney stones as well as the complications of untreated stones and the impact on quality of life without treatment as well as with each of the above listed treatments. We also discussed the efficacy of each treatment in its ability to clear the stone burden. With any of these management options I discussed  the signs and symptoms of infection and the need for emergent treatment should these be experienced. For each option we discussed the ability of each procedure to clear the patient of their stone burden.   For observation I described the risks which include but are not limited to silent renal damage, life-threatening infection, need for emergent surgery, failure to pass stone and pain.   For ureteroscopy I described the risks which include bleeding, infection, damage to contiguous structures, positioning injury, ureteral stricture, ureteral avulsion, ureteral injury, need for prolonged ureteral stent, inability to perform ureteroscopy, need for an interval procedure, inability to clear stone burden, stent discomfort/pain, heart attack, stroke, pulmonary embolus and the inherent risks with general anesthesia.   For shockwave lithotripsy I described the risks which include arrhythmia, kidney contusion, kidney hemorrhage, need for transfusion, pain, inability to adequately break up stone, inability to pass stone fragments, Steinstrasse, infection associated with obstructing stones, need for alternate surgical procedure, need for repeat shockwave lithotripsy, MI, CVA, PE and the inherent risks with anesthesia/conscious sedation.   For PCNL I described the risks including positioning injury, pneumothorax, hydrothorax, need for chest tube, inability to clear stone burden, renal laceration, arterial venous fistula or malformation, need for embolization of kidney, loss of kidney or renal function, need for repeat procedure, need for prolonged nephrostomy tube, ureteral avulsion, MI, CVA, PE and the inherent risks of general anesthesia.   - The patient would like to proceed  with medical expulsive therapy. We will start flomax 0.4mg  daily and we will have Angela Young urine strained. If Angela Young fails to pass Angela Young calculus Angela Young will be scheduled for right ureteroscopic stone extraction Sunday.  Please continue broad spectrum  antibiotics pending urine culture.   Angela Young 02/12/2023, 8:56 PM

## 2023-02-12 NOTE — Progress Notes (Signed)
Patient transferred from ED via bed at 1500PM. Alert and oriented

## 2023-02-12 NOTE — Assessment & Plan Note (Addendum)
AKI with creat 1.6 up from 0.6 baseline as recently as July of last year. Severe peripheral edema, onset over past 3 days. Appears to be obstructive? With apparent R obstructing stone at UVJ.  Not clear why L kidney hasn't taken over but a chronically non functioning L kidney would explain why she's not responding to lasix as well. Regardless, next step is to involve urology and, I suspect, to see if relieving the obstruction turns the kidney function (and volume overload) around. Dr. Cardell Peach confirms, says call day urologist at 0700 (10 mins from now) to tee her up for ureteral stent. Keep pt NPO. I had also ordered myeloma panel, spep, upep, 24h urine protein etc. Prior to getting CT results back, though not clear if this will still be needed or not. EDP gave dose of rocephin for possible UTI.

## 2023-02-12 NOTE — ED Notes (Addendum)
ED TO INPATIENT HANDOFF REPORT  ED Nurse Name and Phone #: Osvaldo Shipper RN [phone]  S Name/Age/Gender Angela Young 89 y.o. female Room/Bed: 009C/009C  Code Status   Code Status: Full Code  Home/SNF/Other Home Patient oriented to: self, place, time, and situation Is this baseline? Yes   Triage Complete: Triage complete  Chief Complaint AKI (acute kidney injury) [N17.9]  Triage Note Pt c/o dry heaves and diarrhea x 1 week; endorses generalized soreness; endorses some sob; swelling to bilateral LE x a few days; denies fevers   Allergies Allergies  Allergen Reactions   Duloxetine Other (See Comments)   Ether    Morphine Other (See Comments)    Level of Care/Admitting Diagnosis ED Disposition     ED Disposition  Admit   Condition  --   Comment  Hospital Area: [facility] [[id number]]  Level of Care: Telemetry Medical [104]  May place patient in observation at [facility] or [facility] if equivalent level of care is available:: No  Covid Evaluation: Asymptomatic - no recent exposure (last 10 days) testing not required  Diagnosis: AKI (acute kidney injury) [[id number]]  Admitting Physician: Hillary Bow [phone]  Attending Physician: Hillary Bow [phone]          B Medical/Surgery History Past Medical History:  Diagnosis Date   Arthritis    Depression    Past Surgical History:  Procedure Laterality Date   ABDOMINAL HYSTERECTOMY     BREAST LUMPECTOMY     EYE SURGERY     due to trauma   HIP SURGERY Right 05/13/11    total hip replacement dr Despina Hick; left 07/11/08 Dr Ernest Pine   KNEE SURGERY Left 10/10   resection of right carotid tumor     benign per pt     A IV Location/Drains/Wounds Patient Lines/Drains/Airways Status     Active Line/Drains/Airways     Name Placement date Placement time Site Days   Peripheral IV 02/11/23 18 G 1.25" Anterior;Left;Proximal Forearm 02/11/23  2329  Forearm  1            Intake/Output Last 24  hours  Intake/Output Summary (Last 24 hours) at 02/12/2023 1135 Last data filed at 02/12/2023 9562 Gross per 24 hour  Intake 100 ml  Output 600 ml  Net -500 ml    Labs/Imaging Results for orders placed or performed during the hospital encounter of 02/11/23 (from the past 48 hour(s))  Brain natriuretic peptide     Status: None   Collection Time: 02/11/23  4:28 PM  Result Value Ref Range   B Natriuretic Peptide 98.2 0.0 - 100.0 pg/mL    Comment: Performed at [facility], [address] [place], [place] [address]  CBC with Differential     Status: None   Collection Time: 02/11/23  4:30 PM  Result Value Ref Range   WBC 7.2 4.0 - 10.5 K/uL   RBC 4.10 3.87 - 5.11 MIL/uL   Hemoglobin 13.7 12.0 - 15.0 g/dL   HCT 57.8 46.9 - 62.9 %   MCV 99.0 80.0 - 100.0 fL   MCH 33.4 26.0 - 34.0 pg   MCHC 33.7 30.0 - 36.0 g/dL   RDW 52.8 41.3 - 24.4 %   Platelets 306 150 - 400 K/uL   nRBC 0.0 0.0 - 0.2 %   Neutrophils Relative % 75 %   Neutro Abs 5.4 1.7 - 7.7 K/uL   Lymphocytes Relative 15 %   Lymphs Abs 1.1 0.7 -  4.0 K/uL   Monocytes Relative 8 %   Monocytes Absolute 0.6 0.1 - 1.0 K/uL   Eosinophils Relative 1 %   Eosinophils Absolute 0.1 0.0 - 0.5 K/uL   Basophils Relative 0 %   Basophils Absolute 0.0 0.0 - 0.1 K/uL   Immature Granulocytes 1 %   Abs Immature Granulocytes 0.06 0.00 - 0.07 K/uL    Comment: Performed at [facility], [address] [place], [place] [address]  Comprehensive metabolic panel     Status: Abnormal   Collection Time: 02/11/23  4:30 PM  Result Value Ref Range   Sodium 132 (L) 135 - 145 mmol/L   Potassium 3.8 3.5 - 5.1 mmol/L   Chloride 96 (L) 98 - 111 mmol/L   CO2 22 22 - 32 mmol/L   Glucose, Bld 106 (H) 70 - 99 mg/dL    Comment: Glucose reference range applies only to samples taken after fasting for at least 8 hours.   BUN 33 (H) 8 - 23 mg/dL   Creatinine, Ser 6.04 (H) 0.44 - 1.00 mg/dL   Calcium 8.6 (L) 8.9 - 10.3 mg/dL   Total Protein 6.1 (L)  6.5 - 8.1 g/dL   Albumin 2.2 (L) 3.5 - 5.0 g/dL   AST 42 (H) 15 - 41 U/L   ALT 38 0 - 44 U/L   Alkaline Phosphatase 96 38 - 126 U/L   Total Bilirubin 0.8 0.3 - 1.2 mg/dL   GFR, Estimated 30 (L) >60 mL/min    Comment: (NOTE) Calculated using the CKD-EPI Creatinine Equation (2021)    Anion gap 14 5 - 15    Comment: Performed at [facility], [address], [place], [place] [address]  Urinalysis, Routine w reflex microscopic -Urine, Clean Catch     Status: Abnormal   Collection Time: 02/11/23  7:38 PM  Result Value Ref Range   Color, Urine AMBER (A) YELLOW    Comment: BIOCHEMICALS MAY BE AFFECTED BY COLOR   APPearance CLOUDY (A) CLEAR   Specific Gravity, Urine 1.013 1.005 - 1.030   pH 5.0 5.0 - 8.0   Glucose, UA NEGATIVE NEGATIVE mg/dL   Hgb urine dipstick MODERATE (A) NEGATIVE   Bilirubin Urine NEGATIVE NEGATIVE   Ketones, ur NEGATIVE NEGATIVE mg/dL   Protein, ur 119 (A) NEGATIVE mg/dL   Nitrite NEGATIVE NEGATIVE   Leukocytes,Ua LARGE (A) NEGATIVE   RBC / HPF 11-20 0 - 5 RBC/hpf   WBC, UA >50 0 - 5 WBC/hpf   Bacteria, UA MANY (A) NONE SEEN   Squamous Epithelial / HPF 0-5 0 - 5 /HPF   Mucus PRESENT     Comment: Performed at [facility], [address] [place], [place] [address]  SARS Coronavirus 2 by RT PCR (hospital order, performed in [facility] hospital lab) *cepheid single result test* Anterior Nasal Swab     Status: None   Collection Time: 02/11/23 11:30 PM   Specimen: Anterior Nasal Swab  Result Value Ref Range   SARS Coronavirus 2 by RT PCR NEGATIVE NEGATIVE    Comment: Performed at [facility], [address] [place], [place] [address]   CT RENAL STONE STUDY  Result Date: 02/12/2023 CLINICAL DATA:  AKI, rule out obstruction. Recent diarrheal illness. Shortness of breath and leg swelling. EXAM: CT ABDOMEN AND PELVIS WITHOUT CONTRAST TECHNIQUE: Multidetector CT imaging of the abdomen and pelvis was performed following the standard protocol without  IV contrast. RADIATION DOSE REDUCTION: This exam was  performed according to the departmental dose-optimization program which includes automated exposure control, adjustment of the mA and/or kV according to patient size and/or use of iterative reconstruction technique. COMPARISON:  None Available. FINDINGS: Lower chest: Heart is mildly enlarged and coronary artery calcifications are noted. There is a moderate to large hiatal hernia. There is a trace right pleural effusion with consolidation in the right lower lobe. Atelectasis is present at the left lung base. Hepatobiliary: Subcentimeter hypodensities are present in the liver, statistically most likely cysts or hemangiomas. Stones are noted in the gallbladder. No biliary ductal dilatation. Pancreas: Pancreatic atrophy is noted. No pancreatic ductal dilatation or surrounding inflammatory changes. Spleen: Normal in size without focal abnormality. Adrenals/Urinary Tract: The adrenal glands are within normal limits. Nonobstructive renal calculi are noted bilaterally. There is mild-to-moderate hydroureteronephrosis on the right with a 6 mm calculus in the distal right ureter at the ureterovesicular junction, best seen on coronal image 59. No obstructive uropathy on the left. The bladder is unremarkable. Stomach/Bowel: A moderate-to-large hiatal hernia is present. Appendix appears normal. No evidence of bowel wall thickening, distention, or inflammatory changes. No free air or pneumatosis. Scattered diverticula are present along the colon without evidence of diverticulitis. Vascular/Lymphatic: Aortic atherosclerosis. No enlarged abdominal or pelvic lymph nodes. Reproductive: The uterus is not seen.  No adnexal mass. Other: No abdominopelvic ascites. Musculoskeletal: Degenerative changes are present in the thoracolumbar spine. Bilateral hip arthroplasty changes are noted. No acute osseous abnormality. IMPRESSION: 1. Mild-to-moderate obstructive uropathy on the right with a  6 mm calculus in the distal right ureter at the UVJ. 2. Bilateral nephrolithiasis. 3. Small right pleural effusion with atelectasis or infiltrate at the right lung base. 4. Cholelithiasis. 5. Moderate-to-large hiatal hernia. 6. Diverticulosis without diverticulitis. 7. Aortic atherosclerosis. Electronically Signed   By: Thornell Sartorius M.D.   On: 02/12/2023 04:36   DG Chest 2 View  Result Date: 02/11/2023 CLINICAL DATA:  Shortness of breath EXAM: CHEST - 2 VIEW COMPARISON:  None Available. FINDINGS: Small bilateral pleural effusions. Patchy airspace disease at the right base. Borderline cardiomegaly with aortic atherosclerosis. No pneumothorax. IMPRESSION: Small bilateral pleural effusions with patchy airspace disease at the right base which may be due to atelectasis or pneumonia. Borderline cardiomegaly. Electronically Signed   By: Jasmine Pang M.D.   On: 02/11/2023 17:32    Pending Labs Unresulted Labs (From admission, onward)     Start     Ordered   02/13/23 0500  CBC  Tomorrow morning,   R        02/12/23 0641   02/13/23 0500  Basic metabolic panel  Tomorrow morning,   R        02/12/23 0641   02/13/23 0500  Hepatic function panel  Tomorrow morning,   R        02/12/23 0752   02/12/23 0352  UPEP/UIFE/Light Chains/TP, 24-Hr Ur  Once,   URGENT        02/12/23 0351   02/12/23 0351  Multiple Myeloma Panel (SPEP&IFE w/QIG)  Once,   URGENT        02/12/23 0350   02/12/23 0351  Protein, urine, 24 hour  Once,   URGENT        02/12/23 0350   02/11/23 2351  Urine Culture  Once,   URGENT       Question:  Indication  Answer:  Dysuria   02/11/23 2351            Vitals/Pain Today's Vitals   02/12/23  1308 02/12/23 0900 02/12/23 1000 02/12/23 1100  BP:  118/74 (!) 132/99 125/81  Pulse:   94   Resp:  (!) 23 17 19   Temp:      TempSrc:      SpO2:   100%   Weight:      Height:      PainSc: 5        Isolation Precautions No active isolations  Medications Medications  heparin  injection 5,000 Units (has no administration in time range)  acetaminophen (TYLENOL) tablet 650 mg (has no administration in time range)    Or  acetaminophen (TYLENOL) suppository 650 mg (has no administration in time range)  ondansetron (ZOFRAN) tablet 4 mg (has no administration in time range)    Or  ondansetron (ZOFRAN) injection 4 mg (has no administration in time range)  cefTRIAXone (ROCEPHIN) 1 g in sodium chloride 0.9 % 100 mL IVPB (has no administration in time range)  cefTRIAXone (ROCEPHIN) 1 g in sodium chloride 0.9 % 100 mL IVPB (0 g Intravenous Stopped 02/12/23 0110)  furosemide (LASIX) injection 40 mg (40 mg Intravenous Given 02/12/23 0013)    Mobility walks with device      Focused Assessments Renal: AKI   R Recommendations: See Admitting Provider Note  Report given to:   Additional Notes: 24 hour urine monitor, pt has not  yet voided

## 2023-02-12 NOTE — Consult Note (Addendum)
Urology Consult  Referring physician: Dr. Xu Reason for referral: right ureteral calculus, AKI  Chief Complaint: abdominal pain  History of Present Illness: Angela Young is a 89yo who was admitted with abdominal pain and AKI. She has been having intermittent mild to moderate abdominal pain for 3 weeks. She had associated urinary urgency and frequency. She also had diarrhea during this time. She has been treated twice in the past month for a UTI. No prior history of nephrolithiasis. He abdominal pain worsened over the past week which brought her to the ER. Currently her pain is well controlled. She had an increase in her creatinine to 1.6 up from a baseline of 0.9. Ct obtained in the ER shows a 6mm right distal ureteral calculus with mild to moderate hydronephrosis. She denies nay fevers. WBC count normal. UA shows WBCs and bacteria. She denies any significant LUTS currently.   Past Medical History:  Diagnosis Date   Arthritis    Depression    Past Surgical History:  Procedure Laterality Date   ABDOMINAL HYSTERECTOMY     BREAST LUMPECTOMY     EYE SURGERY     due to trauma   HIP SURGERY Right 05/13/11    total hip replacement dr Alusio; left 07/11/08 Dr hooten   KNEE SURGERY Left 10/10   resection of right carotid tumor     benign per pt    Medications: I have reviewed the patient's current medications. Allergies:  Allergies  Allergen Reactions   Duloxetine Other (See Comments)   Ether    Morphine Other (See Comments)    Family History  Problem Relation Age of Onset   Arthritis Mother    Anemia Mother    Stroke Mother    Arthritis Sister    Heart disease Sister    Arthritis Brother    Social History:  reports that she has quit smoking. Her smoking use included cigarettes. She has never used smokeless tobacco. She reports that she does not drink alcohol and does not use drugs.  Review of Systems  Gastrointestinal:  Positive for abdominal pain.  Genitourinary:  Positive for  frequency and urgency.  All other systems reviewed and are negative.   Physical Exam:  Vital signs in last 24 hours: Temp:  [97.6 F (36.4 C)-98 F (36.7 C)] 97.6 F (36.4 C) (04/12 2052) Pulse Rate:  [53-105] 101 (04/12 2052) Resp:  [12-24] 18 (04/12 2052) BP: (103-141)/(57-99) 103/57 (04/12 2052) SpO2:  [81 %-100 %] 81 % (04/12 2052) Physical Exam Vitals reviewed.  Constitutional:      Appearance: Normal appearance.  HENT:     Head: Normocephalic and atraumatic.     Mouth/Throat:     Mouth: Mucous membranes are dry.  Eyes:     Extraocular Movements: Extraocular movements intact.     Pupils: Pupils are equal, round, and reactive to light.  Cardiovascular:     Rate and Rhythm: Normal rate and regular rhythm.  Pulmonary:     Effort: Pulmonary effort is normal. No respiratory distress.  Abdominal:     General: Abdomen is flat. There is no distension.  Musculoskeletal:     Cervical back: Normal range of motion and neck supple.  Skin:    General: Skin is warm and dry.  Neurological:     General: No focal deficit present.     Mental Status: She is alert and oriented to person, place, and time.  Psychiatric:        Mood and Affect: Mood normal.          Behavior: Behavior normal.     Laboratory Data:  Results for orders placed or performed during the hospital encounter of 02/11/23 (from the past 72 hour(s))  Brain natriuretic peptide     Status: None   Collection Time: 02/11/23  4:28 PM  Result Value Ref Range   B Natriuretic Peptide 98.2 0.0 - 100.0 pg/mL    Comment: Performed at [facility], [address] [place], [place] [address]  CBC with Differential     Status: None   Collection Time: 02/11/23  4:30 PM  Result Value Ref Range   WBC 7.2 4.0 - 10.5 K/uL   RBC 4.10 3.87 - 5.11 MIL/uL   Hemoglobin 13.7 12.0 - 15.0 g/dL   HCT 40.6 36.0 - 46.0 %   MCV 99.0 80.0 - 100.0 fL   MCH 33.4 26.0 - 34.0 pg   MCHC 33.7 30.0 - 36.0 g/dL   RDW 12.7 11.5 - 15.5 %    Platelets 306 150 - 400 K/uL   nRBC 0.0 0.0 - 0.2 %   Neutrophils Relative % 75 %   Neutro Abs 5.4 1.7 - 7.7 K/uL   Lymphocytes Relative 15 %   Lymphs Abs 1.1 0.7 - 4.0 K/uL   Monocytes Relative 8 %   Monocytes Absolute 0.6 0.1 - 1.0 K/uL   Eosinophils Relative 1 %   Eosinophils Absolute 0.1 0.0 - 0.5 K/uL   Basophils Relative 0 %   Basophils Absolute 0.0 0.0 - 0.1 K/uL   Immature Granulocytes 1 %   Abs Immature Granulocytes 0.06 0.00 - 0.07 K/uL    Comment: Performed at [facility], [address] [place], [place] [address]  Comprehensive metabolic panel     Status: Abnormal   Collection Time: 02/11/23  4:30 PM  Result Value Ref Range   Sodium 132 (L) 135 - 145 mmol/L   Potassium 3.8 3.5 - 5.1 mmol/L   Chloride 96 (L) 98 - 111 mmol/L   CO2 22 22 - 32 mmol/L   Glucose, Bld 106 (H) 70 - 99 mg/dL    Comment: Glucose reference range applies only to samples taken after fasting for at least 8 hours.   BUN 33 (H) 8 - 23 mg/dL   Creatinine, Ser 1.62 (H) 0.44 - 1.00 mg/dL   Calcium 8.6 (L) 8.9 - 10.3 mg/dL   Total Protein 6.1 (L) 6.5 - 8.1 g/dL   Albumin 2.2 (L) 3.5 - 5.0 g/dL   AST 42 (H) 15 - 41 U/L   ALT 38 0 - 44 U/L   Alkaline Phosphatase 96 38 - 126 U/L   Total Bilirubin 0.8 0.3 - 1.2 mg/dL   GFR, Estimated 30 (L) >60 mL/min    Comment: (NOTE) Calculated using the CKD-EPI Creatinine Equation (2021)    Anion gap 14 5 - 15    Comment: Performed at [facility], [address] [place], [place] [address]  Urinalysis, Routine w reflex microscopic -Urine, Clean Catch     Status: Abnormal   Collection Time: 02/11/23  7:38 PM  Result Value Ref Range   Color, Urine AMBER (A) YELLOW    Comment: BIOCHEMICALS MAY BE AFFECTED BY COLOR   APPearance CLOUDY (A) CLEAR   Specific Gravity, Urine 1.013 1.005 - 1.030   pH 5.0 5.0 - 8.0   Glucose, UA NEGATIVE NEGATIVE mg/dL   Hgb urine dipstick MODERATE (A) NEGATIVE   Bilirubin Urine NEGATIVE NEGATIVE   Ketones, ur NEGATIVE  NEGATIVE mg/dL     Protein, ur 100 (A) NEGATIVE mg/dL   Nitrite NEGATIVE NEGATIVE   Leukocytes,Ua LARGE (A) NEGATIVE   RBC / HPF 11-20 0 - 5 RBC/hpf   WBC, UA >50 0 - 5 WBC/hpf   Bacteria, UA MANY (A) NONE SEEN   Squamous Epithelial / HPF 0-5 0 - 5 /HPF   Mucus PRESENT     Comment: Performed at [facility], [address] [place], [place] [address]  SARS Coronavirus 2 by RT PCR (hospital order, performed in [facility] hospital lab) *cepheid single result test* Anterior Nasal Swab     Status: None   Collection Time: 02/11/23 11:30 PM   Specimen: Anterior Nasal Swab  Result Value Ref Range   SARS Coronavirus 2 by RT PCR NEGATIVE NEGATIVE    Comment: Performed at [facility], [address] [place], [place] [address]   Recent Results (from the past 240 hour(s))  SARS Coronavirus 2 by RT PCR (hospital order, performed in [place] hospital lab) *cepheid single result test* Anterior Nasal Swab     Status: None   Collection Time: 02/11/23 11:30 PM   Specimen: Anterior Nasal Swab  Result Value Ref Range Status   SARS Coronavirus 2 by RT PCR NEGATIVE NEGATIVE Final    Comment: Performed at [facility], [address] [place], [place] [address]   Creatinine: Recent Labs    02/11/23 1630  CREATININE 1.62*   Baseline Creatinine: 0.9  Impression/Assessment:  89yo with right ureteral calculus  Plan:  -We discussed the management of kidney stones. These options include observation, ureteroscopy, shockwave lithotripsy (ESWL) and percutaneous nephrolithotomy (PCNL). We discussed which options are relevant to the patient's stone(s). We discussed the natural history of kidney stones as well as the complications of untreated stones and the impact on quality of life without treatment as well as with each of the above listed treatments. We also discussed the efficacy of each treatment in its ability to clear the stone burden. With any of these management options I discussed  the signs and symptoms of infection and the need for emergent treatment should these be experienced. For each option we discussed the ability of each procedure to clear the patient of their stone burden.   For observation I described the risks which include but are not limited to silent renal damage, life-threatening infection, need for emergent surgery, failure to pass stone and pain.   For ureteroscopy I described the risks which include bleeding, infection, damage to contiguous structures, positioning injury, ureteral stricture, ureteral avulsion, ureteral injury, need for prolonged ureteral stent, inability to perform ureteroscopy, need for an interval procedure, inability to clear stone burden, stent discomfort/pain, heart attack, stroke, pulmonary embolus and the inherent risks with general anesthesia.   For shockwave lithotripsy I described the risks which include arrhythmia, kidney contusion, kidney hemorrhage, need for transfusion, pain, inability to adequately break up stone, inability to pass stone fragments, Steinstrasse, infection associated with obstructing stones, need for alternate surgical procedure, need for repeat shockwave lithotripsy, MI, CVA, PE and the inherent risks with anesthesia/conscious sedation.   For PCNL I described the risks including positioning injury, pneumothorax, hydrothorax, need for chest tube, inability to clear stone burden, renal laceration, arterial venous fistula or malformation, need for embolization of kidney, loss of kidney or renal function, need for repeat procedure, need for prolonged nephrostomy tube, ureteral avulsion, MI, CVA, PE and the inherent risks of general anesthesia.   - The patient would like to proceed   with medical expulsive therapy. We will start flomax 0.4mg daily and we will have her urine strained. If she fails to pass her calculus she will be scheduled for right ureteroscopic stone extraction Sunday.  Please continue broad spectrum  antibiotics pending urine culture.   Kamarii Carton 02/12/2023, 8:56 PM      

## 2023-02-12 NOTE — Progress Notes (Signed)
  Echocardiogram 2D Echocardiogram has been performed.  Milda Smart 02/12/2023, 1:30 PM

## 2023-02-12 NOTE — Assessment & Plan Note (Addendum)
Volume overload / BLE edema. Cardiogenic vs nephrogenic? -B effusions and cardiomegally on CXR. -Hypoalbuminemia and AKI also present. 2d echo AKI work up as above Appears to be nephrogenic?  Question if L kidney chronically non functioning and obstruction of R kidney to blame? Involve urology as above.

## 2023-02-12 NOTE — ED Notes (Signed)
Pt well appearing upon transfer to floor. 

## 2023-02-12 NOTE — H&P (Signed)
History and Physical    Patient: Angela Young ZOX:[id number] DOB: 17-Nov-1930 DOA: 02/11/2023 DOS: the patient was seen and examined on 02/12/2023 PCP: [facility]  Patient coming from: Home  Chief Complaint:  Chief Complaint  Patient presents with   Leg Swelling   HPI: LIBIA FAZZINI is a 89 y.o. female with medical history significant of chronic pain syndrome.  Pt presents to ED with c/o BLE edema onset and progressively worsening over past 3 days.  Pt ill over past 2 weeks with N/V/D.  The GI symptoms have improved.  Still having generalized body aches.  No fevers, chills, sweats.  Having some SOB.  No CP.  Has mild cough, nonproductive.  She had 1 day where she felt like there was some pain in her bladder and she took phenazopyridine, and then did not urinate for the next 2 days. She has resumed urinating.   Has been taking ibuprofen for aching.  Has been drinking water.    Review of Systems: As mentioned in the history of present illness. All other systems reviewed and are negative. Past Medical History:  Diagnosis Date   Arthritis    Depression    Past Surgical History:  Procedure Laterality Date   ABDOMINAL HYSTERECTOMY     BREAST LUMPECTOMY     EYE SURGERY     due to trauma   HIP SURGERY Right 05/13/11    total hip replacement dr Despina Hick; left 07/11/08 Dr Ernest Pine   KNEE SURGERY Left 10/10   resection of right carotid tumor     benign per pt   Social History:  reports that she has quit smoking. Her smoking use included cigarettes. She has never used smokeless tobacco. She reports that she does not drink alcohol and does not use drugs.  Allergies  Allergen Reactions   Duloxetine Other (See Comments)   Ether    Morphine Other (See Comments)    Family History  Problem Relation Age of Onset   Arthritis Mother    Anemia Mother    Stroke Mother    Arthritis Sister    Heart disease Sister    Arthritis Brother     Prior to Admission  medications   Medication Sig Start Date End Date Taking? Authorizing Provider  ibuprofen (ADVIL) 200 MG tablet Take 200 mg by mouth every 6 (six) hours as needed for mild pain.   Yes [provider]    Physical Exam: Vitals:   02/12/23 0500 02/12/23 0600 02/12/23 0613 02/12/23 0647  BP: 113/78 120/67    Pulse:  100    Resp:  18 18   Temp:    97.8 F (36.6 C)  TempSrc:      SpO2:  97%    Weight:      Height:       Constitutional: NAD, calm, comfortable Respiratory: clear to auscultation bilaterally, no wheezing, no crackles. Normal respiratory effort. No accessory muscle use.  Cardiovascular: Regular rate and rhythm, no murmurs / rubs / gallops. 3-4+ BLE edema Abdomen: no tenderness, no masses palpated. No hepatosplenomegaly. Bowel sounds positive.  Neurologic: CN 2-12 grossly intact. Sensation intact, DTR normal. Strength 5/5 in all 4.  Psychiatric: Normal judgment and insight. Alert and oriented x 3. Normal mood.   Data Reviewed:       Latest Ref Rng & Units 02/11/2023    4:30 PM 05/25/2021    5:25 PM 02/12/2020    5:14 PM  CBC  WBC 4.0 - 10.5  K/uL 7.2  5.4  4.5   Hemoglobin 12.0 - 15.0 g/dL 36.1  44.3  15.4   Hematocrit 36.0 - 46.0 % 40.6  46.3  39.4   Platelets 150 - 400 K/uL 306  157  327       Latest Ref Rng & Units 02/11/2023    4:30 PM 05/25/2021    5:25 PM 02/12/2020    5:14 PM  CMP  Glucose 70 - 99 mg/dL 008  676  195   BUN 8 - 23 mg/dL 33  12  12   Creatinine 0.44 - 1.00 mg/dL 0.93  2.67  1.24   Sodium 135 - 145 mmol/L 132  138  139   Potassium 3.5 - 5.1 mmol/L 3.8  3.9  3.9   Chloride 98 - 111 mmol/L 96  107  106   CO2 22 - 32 mmol/L 22  24  25    Calcium 8.9 - 10.3 mg/dL 8.6  9.4  8.5   Total Protein 6.5 - 8.1 g/dL 6.1  6.6  5.6   Total Bilirubin 0.3 - 1.2 mg/dL 0.8  2.9  0.6   Alkaline Phos 38 - 126 U/L 96  60  87   AST 15 - 41 U/L 42  22  19   ALT 0 - 44 U/L 38  21  22    Urinalysis    Component Value Date/Time   COLORURINE AMBER (A)  02/11/2023 1938   APPEARANCEUR CLOUDY (A) 02/11/2023 1938   LABSPEC 1.013 02/11/2023 1938   PHURINE 5.0 02/11/2023 1938   GLUCOSEU NEGATIVE 02/11/2023 1938   HGBUR MODERATE (A) 02/11/2023 1938   BILIRUBINUR NEGATIVE 02/11/2023 1938   BILIRUBINUR neg 12/13/2015 1631   KETONESUR NEGATIVE 02/11/2023 1938   PROTEINUR 100 (A) 02/11/2023 1938   UROBILINOGEN 0.2 12/13/2015 1631   UROBILINOGEN 0.2 05/01/2011 1130   NITRITE NEGATIVE 02/11/2023 1938   LEUKOCYTESUR LARGE (A) 02/11/2023 1938   CT AP: IMPRESSION: 1. Mild-to-moderate obstructive uropathy on the right with a 6 mm calculus in the distal right ureter at the UVJ. 2. Bilateral nephrolithiasis. 3. Small right pleural effusion with atelectasis or infiltrate at the right lung base. 4. Cholelithiasis. 5. Moderate-to-large hiatal hernia. 6. Diverticulosis without diverticulitis. 7. Aortic atherosclerosis.   Assessment and Plan: * AKI (acute kidney injury) AKI with creat 1.6 up from 0.6 baseline as recently as July of last year. Severe peripheral edema, onset over past 3 days. Appears to be obstructive? With apparent R obstructing stone at UVJ.  Not clear why L kidney hasn't taken over but a chronically non functioning L kidney would explain why she's not responding to lasix as well. Regardless, next step is to involve urology and, I suspect, to see if relieving the obstruction turns the kidney function (and volume overload) around. Dr. Cardell Peach confirms, says call day urologist at 0700 (10 mins from now) to tee her up for ureteral stent. Keep pt NPO. I had also ordered myeloma panel, spep, upep, 24h urine protein etc. Prior to getting CT results back, though not clear if this will still be needed or not.  Peripheral edema Volume overload / BLE edema. Cardiogenic vs nephrogenic? -B effusions and cardiomegally on CXR. -Hypoalbuminemia and AKI also present. 2d echo AKI work up as above Appears to be nephrogenic?  Question if L kidney  chronically non functioning and obstruction of R kidney to blame? Involve urology as above.      Advance Care Planning:   Code Status: Full Code currently  full code per pt and daughter  Consults: needs urology consult at 0700  Family Communication: Daughter at bedside  Severity of Illness: The appropriate patient status for this patient is OBSERVATION. Observation status is judged to be reasonable and necessary in order to provide the required intensity of service to ensure the patient's safety. The patient's presenting symptoms, physical exam findings, and initial radiographic and laboratory data in the context of their medical condition is felt to place them at decreased risk for further clinical deterioration. Furthermore, it is anticipated that the patient will be medically stable for discharge from the hospital within 2 midnights of admission.   Author: Hillary Bow., DO 02/12/2023 6:56 AM  For on call review [email or web address].

## 2023-02-12 NOTE — Progress Notes (Signed)
Patient is admitted already this morning, detail please see HPI, patient is seen with family at bedside  Pt presents to ED with c/o BLE edema onset and progressively worsening over past 3 days. Pt reports N/V/Dx 2wks.  The GI symptoms have improved.  Still having generalized body aches.    Having some SOB, mild nonproductive cough, No CP,  found to have obstructive 6 mm right UVJ stone, bilateral nephrolithiasis, UTI, proteinuria, aki, she is made npo, continue Rocephin , follow-up urine culture urology Dr. Ronne Binning made aware.  Anasarca with bilateral lower extremity edema,Small right pleural effusion with atelectasis or infiltrate at the right lung base,Albumin 2.2, proteinuria, 24-hour urine protein, UPEP, light chain, multiple myeloma panel , echo ordered on admission  Reports edema has much improved with lasix, lung largely clear  Mild elevated LFT, CT Hepatobiliary: Subcentimeter hypodensities are present in the liver, statistically most likely cysts or hemangiomas. Stones are noted in the gallbladder. No biliary ductal dilatation. repeat  lft in a.m.  Other incidental findings on CT scan: Moderate-to-large hiatal hernia. Diverticulosis without diverticulitis. Aortic atherosclerosis.

## 2023-02-12 NOTE — ED Notes (Addendum)
ED TO INPATIENT HANDOFF REPORT  ED Nurse Name and Phone #: Charlotte Sanes [phone]  S Name/Age/Gender Angela Young 89 y.o. female Room/Bed: 009C/009C  Code Status   Code Status: Full Code  Home/SNF/Other Home Patient oriented to: self, place, time, and situation Is this baseline? Yes   Triage Complete: Triage complete  Chief Complaint AKI (acute kidney injury) [N17.9]  Triage Note Pt c/o dry heaves and diarrhea x 1 week; endorses generalized soreness; endorses some sob; swelling to bilateral LE x a few days; denies fevers   Allergies Allergies  Allergen Reactions   Duloxetine Other (See Comments)   Ether    Morphine Other (See Comments)    Level of Care/Admitting Diagnosis ED Disposition     ED Disposition  Admit   Condition  --   Comment  Hospital Area: [facility] [[id number]]  Level of Care: Telemetry Medical [104]  May place patient in observation at [facility] or [facility] if equivalent level of care is available:: No  Covid Evaluation: Asymptomatic - no recent exposure (last 10 days) testing not required  Diagnosis: AKI (acute kidney injury) [454098]  Admitting Physician: Hillary Bow [phone]  Attending Physician: Hillary Bow [phone]          B Medical/Surgery History Past Medical History:  Diagnosis Date   Arthritis    Depression    Past Surgical History:  Procedure Laterality Date   ABDOMINAL HYSTERECTOMY     BREAST LUMPECTOMY     EYE SURGERY     due to trauma   HIP SURGERY Right 05/13/11    total hip replacement dr Despina Hick; left 07/11/08 Dr Ernest Pine   KNEE SURGERY Left 10/10   resection of right carotid tumor     benign per pt     A IV Location/Drains/Wounds Patient Lines/Drains/Airways Status     Active Line/Drains/Airways     Name Placement date Placement time Site Days   Peripheral IV 02/11/23 18 G 1.25" Anterior;Left;Proximal Forearm 02/11/23  2329  Forearm  1            Intake/Output Last 24  hours  Intake/Output Summary (Last 24 hours) at 02/12/2023 1029 Last data filed at 02/12/2023 9562 Gross per 24 hour  Intake 100 ml  Output 600 ml  Net -500 ml    Labs/Imaging Results for orders placed or performed during the hospital encounter of 02/11/23 (from the past 48 hour(s))  Brain natriuretic peptide     Status: None   Collection Time: 02/11/23  4:28 PM  Result Value Ref Range   B Natriuretic Peptide 98.2 0.0 - 100.0 pg/mL    Comment: Performed at [facility], [address], [place], [place] [address]  CBC with Differential     Status: None   Collection Time: 02/11/23  4:30 PM  Result Value Ref Range   WBC 7.2 4.0 - 10.5 K/uL   RBC 4.10 3.87 - 5.11 MIL/uL   Hemoglobin 13.7 12.0 - 15.0 g/dL   HCT 57.8 46.9 - 62.9 %   MCV 99.0 80.0 - 100.0 fL   MCH 33.4 26.0 - 34.0 pg   MCHC 33.7 30.0 - 36.0 g/dL   RDW 52.8 41.3 - 24.4 %   Platelets 306 150 - 400 K/uL   nRBC 0.0 0.0 - 0.2 %   Neutrophils Relative % 75 %   Neutro Abs 5.4 1.7 - 7.7 K/uL   Lymphocytes Relative 15 %   Lymphs Abs 1.1 0.7 - 4.0  K/uL   Monocytes Relative 8 %   Monocytes Absolute 0.6 0.1 - 1.0 K/uL   Eosinophils Relative 1 %   Eosinophils Absolute 0.1 0.0 - 0.5 K/uL   Basophils Relative 0 %   Basophils Absolute 0.0 0.0 - 0.1 K/uL   Immature Granulocytes 1 %   Abs Immature Granulocytes 0.06 0.00 - 0.07 K/uL    Comment: Performed at [facility], [address], [place], [place] [address]  Comprehensive metabolic panel     Status: Abnormal   Collection Time: 02/11/23  4:30 PM  Result Value Ref Range   Sodium 132 (L) 135 - 145 mmol/L   Potassium 3.8 3.5 - 5.1 mmol/L   Chloride 96 (L) 98 - 111 mmol/L   CO2 22 22 - 32 mmol/L   Glucose, Bld 106 (H) 70 - 99 mg/dL    Comment: Glucose reference range applies only to samples taken after fasting for at least 8 hours.   BUN 33 (H) 8 - 23 mg/dL   Creatinine, Ser 6.04 (H) 0.44 - 1.00 mg/dL   Calcium 8.6 (L) 8.9 - 10.3 mg/dL   Total Protein 6.1 (L)  6.5 - 8.1 g/dL   Albumin 2.2 (L) 3.5 - 5.0 g/dL   AST 42 (H) 15 - 41 U/L   ALT 38 0 - 44 U/L   Alkaline Phosphatase 96 38 - 126 U/L   Total Bilirubin 0.8 0.3 - 1.2 mg/dL   GFR, Estimated 30 (L) >60 mL/min    Comment: (NOTE) Calculated using the CKD-EPI Creatinine Equation (2021)    Anion gap 14 5 - 15    Comment: Performed at [facility], [address], [place], [place] [address]  Urinalysis, Routine w reflex microscopic -Urine, Clean Catch     Status: Abnormal   Collection Time: 02/11/23  7:38 PM  Result Value Ref Range   Color, Urine AMBER (A) YELLOW    Comment: BIOCHEMICALS MAY BE AFFECTED BY COLOR   APPearance CLOUDY (A) CLEAR   Specific Gravity, Urine 1.013 1.005 - 1.030   pH 5.0 5.0 - 8.0   Glucose, UA NEGATIVE NEGATIVE mg/dL   Hgb urine dipstick MODERATE (A) NEGATIVE   Bilirubin Urine NEGATIVE NEGATIVE   Ketones, ur NEGATIVE NEGATIVE mg/dL   Protein, ur 119 (A) NEGATIVE mg/dL   Nitrite NEGATIVE NEGATIVE   Leukocytes,Ua LARGE (A) NEGATIVE   RBC / HPF 11-20 0 - 5 RBC/hpf   WBC, UA >50 0 - 5 WBC/hpf   Bacteria, UA MANY (A) NONE SEEN   Squamous Epithelial / HPF 0-5 0 - 5 /HPF   Mucus PRESENT     Comment: Performed at [facility], [address], [place], [place] [address]  SARS Coronavirus 2 by RT PCR (hospital order, performed in [facility] hospital lab) *cepheid single result test* Anterior Nasal Swab     Status: None   Collection Time: 02/11/23 11:30 PM   Specimen: Anterior Nasal Swab  Result Value Ref Range   SARS Coronavirus 2 by RT PCR NEGATIVE NEGATIVE    Comment: Performed at [facility], [address], [place], [place] [address]   CT RENAL STONE STUDY  Result Date: 02/12/2023 CLINICAL DATA:  AKI, rule out obstruction. Recent diarrheal illness. Shortness of breath and leg swelling. EXAM: CT ABDOMEN AND PELVIS WITHOUT CONTRAST TECHNIQUE: Multidetector CT imaging of the abdomen and pelvis was performed following the standard protocol without  IV contrast. RADIATION DOSE REDUCTION: This exam was performed  according to the departmental dose-optimization program which includes automated exposure control, adjustment of the mA and/or kV according to patient size and/or use of iterative reconstruction technique. COMPARISON:  None Available. FINDINGS: Lower chest: Heart is mildly enlarged and coronary artery calcifications are noted. There is a moderate to large hiatal hernia. There is a trace right pleural effusion with consolidation in the right lower lobe. Atelectasis is present at the left lung base. Hepatobiliary: Subcentimeter hypodensities are present in the liver, statistically most likely cysts or hemangiomas. Stones are noted in the gallbladder. No biliary ductal dilatation. Pancreas: Pancreatic atrophy is noted. No pancreatic ductal dilatation or surrounding inflammatory changes. Spleen: Normal in size without focal abnormality. Adrenals/Urinary Tract: The adrenal glands are within normal limits. Nonobstructive renal calculi are noted bilaterally. There is mild-to-moderate hydroureteronephrosis on the right with a 6 mm calculus in the distal right ureter at the ureterovesicular junction, best seen on coronal image 59. No obstructive uropathy on the left. The bladder is unremarkable. Stomach/Bowel: A moderate-to-large hiatal hernia is present. Appendix appears normal. No evidence of bowel wall thickening, distention, or inflammatory changes. No free air or pneumatosis. Scattered diverticula are present along the colon without evidence of diverticulitis. Vascular/Lymphatic: Aortic atherosclerosis. No enlarged abdominal or pelvic lymph nodes. Reproductive: The uterus is not seen.  No adnexal mass. Other: No abdominopelvic ascites. Musculoskeletal: Degenerative changes are present in the thoracolumbar spine. Bilateral hip arthroplasty changes are noted. No acute osseous abnormality. IMPRESSION: 1. Mild-to-moderate obstructive uropathy on the right with a  6 mm calculus in the distal right ureter at the UVJ. 2. Bilateral nephrolithiasis. 3. Small right pleural effusion with atelectasis or infiltrate at the right lung base. 4. Cholelithiasis. 5. Moderate-to-large hiatal hernia. 6. Diverticulosis without diverticulitis. 7. Aortic atherosclerosis. Electronically Signed   By: Thornell Sartorius M.D.   On: 02/12/2023 04:36   DG Chest 2 View  Result Date: 02/11/2023 CLINICAL DATA:  Shortness of breath EXAM: CHEST - 2 VIEW COMPARISON:  None Available. FINDINGS: Small bilateral pleural effusions. Patchy airspace disease at the right base. Borderline cardiomegaly with aortic atherosclerosis. No pneumothorax. IMPRESSION: Small bilateral pleural effusions with patchy airspace disease at the right base which may be due to atelectasis or pneumonia. Borderline cardiomegaly. Electronically Signed   By: Jasmine Pang M.D.   On: 02/11/2023 17:32    Pending Labs Unresulted Labs (From admission, onward)     Start     Ordered   02/13/23 0500  CBC  Tomorrow morning,   R        02/12/23 0641   02/13/23 0500  Basic metabolic panel  Tomorrow morning,   R        02/12/23 0641   02/13/23 0500  Hepatic function panel  Tomorrow morning,   R        02/12/23 0752   02/12/23 0352  UPEP/UIFE/Light Chains/TP, 24-Hr Ur  Once,   URGENT        02/12/23 0351   02/12/23 0351  Multiple Myeloma Panel (SPEP&IFE w/QIG)  Once,   URGENT        02/12/23 0350   02/12/23 0351  Protein, urine, 24 hour  Once,   URGENT        02/12/23 0350   02/11/23 2351  Urine Culture  Once,   URGENT       Question:  Indication  Answer:  Dysuria   02/11/23 2351            Vitals/Pain Today's Vitals   02/12/23 8676  02/12/23 0716 02/12/23 0900 02/12/23 1000  BP:   118/74 (!) 132/99  Pulse:    94  Resp:   (!) 23 17  Temp: 97.8 F (36.6 C)     TempSrc:      SpO2:    100%  Weight:      Height:      PainSc:  5       Isolation Precautions No active isolations  Medications Medications  heparin  injection 5,000 Units (has no administration in time range)  acetaminophen (TYLENOL) tablet 650 mg (has no administration in time range)    Or  acetaminophen (TYLENOL) suppository 650 mg (has no administration in time range)  ondansetron (ZOFRAN) tablet 4 mg (has no administration in time range)    Or  ondansetron (ZOFRAN) injection 4 mg (has no administration in time range)  cefTRIAXone (ROCEPHIN) 1 g in sodium chloride 0.9 % 100 mL IVPB (has no administration in time range)  cefTRIAXone (ROCEPHIN) 1 g in sodium chloride 0.9 % 100 mL IVPB (0 g Intravenous Stopped 02/12/23 0110)  furosemide (LASIX) injection 40 mg (40 mg Intravenous Given 02/12/23 0013)    Mobility walks with person assist     Focused Assessments     R Recommendations: See Admitting Provider Note  Report given to:   Additional Notes: Pt has 24 hour urine collection in process. Pt has stone, waiting on urology consult. NPO for procedure to insert stent

## 2023-02-12 NOTE — ED Notes (Signed)
Pt is awake, a&ox4, pwd in the bed covered with blankets. Pt is attached to monitor/vitals. Pt complains of feeling "raw" all over. Like her insides are on her outsides. Pt denies cp/shob. No distress noted. Both side rails are up, call light within reach. Pt is NPO.

## 2023-02-13 ENCOUNTER — Encounter (HOSPITAL_COMMUNITY): Payer: Self-pay | Admitting: Internal Medicine

## 2023-02-13 DIAGNOSIS — N179 Acute kidney failure, unspecified: Secondary | ICD-10-CM | POA: Diagnosis not present

## 2023-02-13 LAB — HEPATIC FUNCTION PANEL
ALT: 24 U/L (ref 0–44)
AST: 18 U/L (ref 15–41)
Albumin: 1.7 g/dL — ABNORMAL LOW (ref 3.5–5.0)
Alkaline Phosphatase: 72 U/L (ref 38–126)
Bilirubin, Direct: <0.1 mg/dL (ref 0.0–0.2)
Total Bilirubin: 0.6 mg/dL (ref 0.3–1.2)
Total Protein: 4.8 g/dL — ABNORMAL LOW (ref 6.5–8.1)

## 2023-02-13 LAB — BASIC METABOLIC PANEL
Anion gap: 14 (ref 5–15)
BUN: 37 mg/dL — ABNORMAL HIGH (ref 8–23)
CO2: 23 mmol/L (ref 22–32)
Calcium: 7.9 mg/dL — ABNORMAL LOW (ref 8.9–10.3)
Chloride: 98 mmol/L (ref 98–111)
Creatinine, Ser: 1.5 mg/dL — ABNORMAL HIGH (ref 0.44–1.00)
GFR, Estimated: 33 mL/min — ABNORMAL LOW (ref >60)
Glucose, Bld: 96 mg/dL (ref 70–99)
Potassium: 3.3 mmol/L — ABNORMAL LOW (ref 3.5–5.1)
Sodium: 135 mmol/L (ref 135–145)

## 2023-02-13 LAB — CBC
HCT: 33.8 % — ABNORMAL LOW (ref 36.0–46.0)
Hemoglobin: 11.1 g/dL — ABNORMAL LOW (ref 12.0–15.0)
MCH: 32.5 pg (ref 26.0–34.0)
MCHC: 32.8 g/dL (ref 30.0–36.0)
MCV: 98.8 fL (ref 80.0–100.0)
Platelets: 297 10*3/uL (ref 150–400)
RBC: 3.42 MIL/uL — ABNORMAL LOW (ref 3.87–5.11)
RDW: 12.7 % (ref 11.5–15.5)
WBC: 5.1 10*3/uL (ref 4.0–10.5)
nRBC: 0 % (ref 0.0–0.2)

## 2023-02-13 LAB — URINE CULTURE

## 2023-02-13 MED ORDER — LACTATED RINGERS IV BOLUS
1000.0000 mL | Freq: Once | INTRAVENOUS | Status: AC
  Administered 2023-02-13: 1000 mL via INTRAVENOUS

## 2023-02-13 MED ORDER — MIDODRINE HCL 5 MG PO TABS
5.0000 mg | ORAL_TABLET | Freq: Three times a day (TID) | ORAL | Status: AC
  Administered 2023-02-14 (×2): 5 mg via ORAL
  Filled 2023-02-13 (×2): qty 1

## 2023-02-13 MED ORDER — POTASSIUM CHLORIDE CRYS ER 20 MEQ PO TBCR
40.0000 meq | EXTENDED_RELEASE_TABLET | Freq: Once | ORAL | Status: AC
  Administered 2023-02-13: 40 meq via ORAL
  Filled 2023-02-13: qty 2

## 2023-02-13 MED ORDER — LIDOCAINE VISCOUS HCL 2 % MT SOLN
15.0000 mL | Freq: Four times a day (QID) | OROMUCOSAL | Status: DC | PRN
  Administered 2023-02-13 (×2): 15 mL via OROMUCOSAL
  Filled 2023-02-13 (×4): qty 15

## 2023-02-13 MED ORDER — MIDODRINE HCL 5 MG PO TABS
2.5000 mg | ORAL_TABLET | Freq: Three times a day (TID) | ORAL | Status: DC
  Administered 2023-02-13: 2.5 mg via ORAL
  Filled 2023-02-13: qty 1

## 2023-02-13 MED ORDER — LACTATED RINGERS IV SOLN: INTRAVENOUS | Status: AC

## 2023-02-13 MED ORDER — PIPERACILLIN-TAZOBACTAM 3.375 G IVPB
3.3750 g | Freq: Three times a day (TID) | INTRAVENOUS | Status: DC
  Administered 2023-02-13 – 2023-02-15 (×6): 3.375 g via INTRAVENOUS
  Filled 2023-02-13 (×6): qty 50

## 2023-02-13 MED ORDER — ALBUMIN HUMAN 25 % IV SOLN
12.5000 g | Freq: Once | INTRAVENOUS | Status: AC
  Administered 2023-02-13: 12.5 g via INTRAVENOUS
  Filled 2023-02-13: qty 50

## 2023-02-13 MED ORDER — MUSCLE RUB 10-15 % EX CREA
TOPICAL_CREAM | CUTANEOUS | Status: DC | PRN
  Administered 2023-02-14 (×2): 1 via TOPICAL
  Filled 2023-02-13 (×2): qty 85

## 2023-02-13 MED ORDER — OXYCODONE HCL 5 MG PO TABS
5.0000 mg | ORAL_TABLET | ORAL | Status: DC | PRN
  Administered 2023-02-13 – 2023-02-15 (×5): 5 mg via ORAL
  Filled 2023-02-13 (×6): qty 1

## 2023-02-13 NOTE — Progress Notes (Signed)
MEWS Progress Note  Patient Details Name: EMERI SCHIMANSKI MRN: [id number] DOB: [birth year] Today's Date: 02/13/2023   MEWS Flowsheet Documentation:  Assess: MEWS Score Temp: (!) 97.5 F (36.4 C) BP: (!) 92/55 MAP (mmHg): 68 Pulse Rate: (!) 106 ECG Heart Rate: (!) 104 Resp: 18 Level of Consciousness: Alert SpO2: 97 % O2 Device: Room Air Assess: MEWS Score MEWS Temp: 0 MEWS Systolic: 1 MEWS Pulse: 1 MEWS RR: 0 MEWS LOC: 0 MEWS Score: 2 MEWS Score Color: Yellow Assess: SIRS CRITERIA SIRS Temperature : 0 SIRS Respirations : 0 SIRS Pulse: 1 SIRS WBC: 0 SIRS Score Sum : 1 SIRS Temperature : 0 SIRS Pulse: 1 SIRS Respirations : 0 SIRS WBC: 0 SIRS Score Sum : 1 Assess: if the MEWS score is Yellow or Red Were vital signs taken at a resting state?: Yes Focused Assessment: No change from prior assessment Does the patient meet 2 or more of the SIRS criteria?: No Does the patient have a confirmed or suspected source of infection?: Yes Provider and Rapid Response Notified?: No MEWS guidelines implemented : Yes, yellow Treat MEWS Interventions: Considered administering scheduled or prn medications/treatments as ordered Take Vital Signs Increase Vital Sign Frequency : Yellow: Q2hr x1, continue Q4hrs until patient remains green for 12hrs Escalate MEWS: Escalate: Yellow: Discuss with charge nurse and consider notifying provider and/or RRT Notify: Charge Nurse/RN Name of Charge Nurse/RN Notified: Samara Deist, RN Provider Notification Provider Name/Title: Roda Shutters Date Provider Notified: 02/13/23 Time Provider Notified: 1226 Method of Notification: Page (secure chat) Notification Reason: Other (Comment), Change in status (change in BP) Provider response: See new orders  Patient's BP lower than before, patient did receive PRN oxycodone approximately one hour before vital signs were checked, and she's had poor oral intake due to ulcerations in her mouth. MD made aware via secure chat.  Charge RN aware.     Malachi Carl 02/13/2023, 12:26 PM

## 2023-02-13 NOTE — Progress Notes (Signed)
PROGRESS NOTE    Angela Young  HOO:[id number] DOB: 06/10/1931 DOA: 02/11/2023 PCP: [facility]     Brief Narrative:   H/o chronic pain syndrome, Pt presents to ED with c/o BLE edema , she reports N/V/Dx 2wks.  The GI symptoms have improved.  found to have obstructive 6 mm right UVJ stone, bilateral nephrolithiasis, UTI, proteinuria, aki  Subjective:  She reports feeling better, but blood pressure is borderline  Assessment & Plan:  Principal Problem:   AKI (acute kidney injury) Active Problems:   Peripheral edema   Nephrolithiasis    Assessment and Plan:   Obstructive 6 mm right UVJ stone, bilateral nephrolithiasis, UTI/AKI No leukocytosis, no fever, but elevated cr, blood pressure is borderline, will broden abx from rocephin to zosyn, start gentle hydration with iv albumin x1 ( albumin is 1.7), start midodrine -npo aftermidnight, to [place] OR for stenting -will follow urology recommendation   Mouth sores Lidocaine mouth wash   Anasarca with bilateral lower extremity edema,Small right pleural effusion with atelectasis or infiltrate at the right lung base,Albumin 2.2, proteinuria, Echo with preserved LVEF, grade I diastolic dysfunction  24-hour urine protein, UPEP, light chain, multiple myeloma panel ,  ordered on admission, follow up on result  Reports edema has much improved with lasix, lung largely clear  Hypokalemia, replace K, check mag   Mild elevated LFT, CT Hepatobiliary: Subcentimeter hypodensities are present in the liver, statistically most likely cysts or hemangiomas. Stones are noted in the gallbladder. No biliary ductal dilatation. repeat  lft in a.m.   Other incidental findings on CT scan: Moderate-to-large hiatal hernia. Diverticulosis without diverticulitis. Aortic atherosclerosis.      I have Reviewed nursing notes, Vitals, pain scores, I/o's, Lab results and  imaging results since pt's last encounter, details  please see discussion above  I ordered the following labs:  Unresulted Labs (From admission, onward)     Start     Ordered   02/14/23 0500  CBC with Differential/Platelet  Tomorrow morning,   R        02/13/23 0840   02/14/23 0500  Basic metabolic panel  Daily,   R      02/13/23 0840   02/14/23 0500  Magnesium  Tomorrow morning,   R        02/13/23 0840   02/14/23 0500  Phosphorus  Tomorrow morning,   R        02/13/23 0840   02/12/23 0352  UPEP/UIFE/Light Chains/TP, 24-Hr Ur  Once,   R        02/12/23 0351   02/12/23 0351  Multiple Myeloma Panel (SPEP&IFE w/QIG)  Once,   URGENT        02/12/23 0350   02/12/23 0351  Protein, urine, 24 hour  Once,   R        02/12/23 0350             DVT prophylaxis: heparin injection 5,000 Units Start: 02/13/23 0600   Code Status:   Code Status: Full Code  Family Communication: Daughter at bedside on 4/12 Disposition:   Dispo: The patient is from: Home              Anticipated d/c is to: Home              Anticipated d/c date is: >24hrs  Antimicrobials:    Anti-infectives (From admission, onward)    Start     Dose/Rate Route Frequency Ordered Stop   02/13/23 1400  piperacillin-tazobactam (ZOSYN) IVPB 3.375 g        3.375 g 12.5 mL/hr over 240 Minutes Intravenous Every 8 hours 02/13/23 1244     02/12/23 2200  cefTRIAXone (ROCEPHIN) 1 g in sodium chloride 0.9 % 100 mL IVPB  Status:  Discontinued        1 g 200 mL/hr over 30 Minutes Intravenous Every 24 hours 02/12/23 0800 02/13/23 1232   02/12/23 0000  cefTRIAXone (ROCEPHIN) 1 g in sodium chloride 0.9 % 100 mL IVPB        1 g 200 mL/hr over 30 Minutes Intravenous  Once 02/11/23 2351 02/12/23 0110          Objective: Vitals:   02/13/23 1158 02/13/23 1207 02/13/23 1348 02/13/23 1649  BP: (!) 92/55 (!) 92/55 (!) 103/53 (!) 88/62  Pulse: (!) 106  84 100  Resp: Temp: (!) 97.5 F (36.4 C) (!) 97.5 F (36.4 C) 98 F (36.7 C) (!) 97.4 F (36.3 C)  TempSrc:     Oral  SpO2:  97% 93% 98%  Weight:      Height:        Intake/Output Summary (Last 24 hours) at 02/13/2023 1741 Last data filed at 02/13/2023 0500 Gross per 24 hour  Intake 100 ml  Output 350 ml  Net -250 ml   Filed Weights   02/11/23 1610  Weight: 54.4 kg    Examination:  General exam: alert, awake, communicative,calm, NAD, hard of hearing Respiratory system: Clear to auscultation. Respiratory effort normal. Cardiovascular system:  RRR.  Gastrointestinal system: Abdomen is nondistended, soft and nontender.  Normal bowel sounds heard. Central nervous system: Alert and oriented. No focal neurological deficits. Extremities:  bilateral lower extremity edema has almost resolved Skin: No rashes, lesions or ulcers Psychiatry: Judgement and insight appear normal. Mood & affect appropriate.     Data Reviewed: I have personally reviewed  labs and visualized  imaging studies since the last encounter and formulate the plan        Scheduled Meds:  heparin  5,000 Units Subcutaneous Q8H   midodrine  2.5 mg Oral TID WC   tamsulosin  0.4 mg Oral QPC supper   Continuous Infusions:  lactated ringers 75 mL/hr at 02/13/23 1322   piperacillin-tazobactam (ZOSYN)  IV 3.375 g (02/13/23 1542)     LOS: 1 day   Time spent:  Albertine Grates, MD PhD FACP [facility]  Available via Epic secure chat 7am-7pm for nonurgent issues Please page for urgent issues To page the attending provider between 7A-7P or the covering provider during after hours 7P-7A, please log into the web site [email or web address] and access using universal [place] password for that web site. If you do not have the password, please call the hospital operator.    02/13/2023, 5:41 PM

## 2023-02-13 NOTE — Progress Notes (Signed)
Report given to RN on 4W at [facility] long, patient is going to room 1444. [facility] arrived to transport patient.

## 2023-02-13 NOTE — Progress Notes (Signed)
[facility] called and transportation is confirmed for transfer to [facility]

## 2023-02-13 NOTE — Progress Notes (Signed)
Pharmacy Antibiotic Note  Angela Young is a 89 y.o. female admitted on 02/11/2023 with N/V/D over the past 2 weeks. She was initiated on ceftriaxone for a suspected UTI. On 4/13 she became hypotensive and antibiotics were broadened for intraabdominal coverage. Pharmacy has been consulted for zosyn dosing for suspected intraabdominal infection.  Plan: Zosyn 3.375g IV q8h (4 hour infusion). Monitor renal function, cultures and clinical progression.   Height: 5\' 2"  (157.5 cm) Weight: 54.4 kg (120 lb) IBW/kg (Calculated) : 50.1  Temp (24hrs), Avg:97.7 F (36.5 C), Min:97.4 F (36.3 C), Max:98 F (36.7 C)  Recent Labs  Lab 02/11/23 1630 02/13/23 0232  WBC 7.2 5.1  CREATININE 1.62* 1.50*    Estimated Creatinine Clearance: 20.1 mL/min (A) (by C-G formula based on SCr of 1.5 mg/dL (H)).    Allergies  Allergen Reactions   Duloxetine Other (See Comments)   Ether    Morphine Other (See Comments)    Antimicrobials this admission: CTX 4/12 X1  Zosyn 4/13 >>   Dose adjustments this admission: None  Microbiology results: 4/11 UCx: GNR  4/11 COVID: negative   Thank you for allowing pharmacy to be a part of this patient's care.  Rennis Petty, PharmD PGY1 Pharmacy Resident 02/13/2023 12:48 PM

## 2023-02-13 NOTE — Progress Notes (Signed)
Subjective: Patient reports no abdominal or flank pain since last night. Urine clear. No fevers  Objective: Vital signs in last 24 hours: Temp:  [97.4 F (36.3 C)-98 F (36.7 C)] 97.5 F (36.4 C) (04/13 1207) Pulse Rate:  [53-106] 106 (04/13 1158) Resp:  [14-21] 18 (04/13 1207) BP: (89-141)/(51-92) 92/55 (04/13 1207) SpO2:  [81 %-99 %] 97 % (04/13 1207)  Intake/Output from previous day: 04/12 0701 - 04/13 0700 In: 100 [IV Piggyback:100] Out: 350 [Urine:350] Intake/Output this shift: No intake/output data recorded.  Physical Exam:  General:alert, cooperative, and appears stated age GI: soft, non tender, normal bowel sounds, no palpable masses, no organomegaly, no inguinal hernia Female genitalia: not done Extremities: extremities normal, atraumatic, no cyanosis or edema  Lab Results: Recent Labs    02/11/23 1630 02/13/23 0232  HGB 13.7 11.1*  HCT 40.6 33.8*   BMET Recent Labs    02/11/23 1630 02/13/23 0232  NA 132* 135  K 3.8 3.3*  CL 96* 98  CO2 22 23  GLUCOSE 106* 96  BUN 33* 37*  CREATININE 1.62* 1.50*  CALCIUM 8.6* 7.9*   No results for input(s): "LABPT", "INR" in the last 72 hours. No results for input(s): "LABURIN" in the last 72 hours. Results for orders placed or performed during the hospital encounter of 02/11/23  Urine Culture     Status: Abnormal (Preliminary result)   Collection Time: 02/11/23  5:01 AM   Specimen: Urine, Clean Catch  Result Value Ref Range Status   Specimen Description URINE, CLEAN CATCH  Final   Special Requests   Final    NONE Performed at [facility], [address] [place], [place] [address]    Culture >=100,000 COLONIES/mL GRAM NEGATIVE RODS (A)  Final   Report Status PENDING  Incomplete  SARS Coronavirus 2 by RT PCR (hospital order, performed in [facility] hospital lab) *cepheid single result test* Anterior Nasal Swab     Status: None   Collection Time: 02/11/23 11:30 PM   Specimen: Anterior Nasal Swab   Result Value Ref Range Status   SARS Coronavirus 2 by RT PCR NEGATIVE NEGATIVE Final    Comment: Performed at [facility], [address] [place], [place] [address]    Studies/Results: ECHOCARDIOGRAM COMPLETE  Result Date: 02/12/2023    ECHOCARDIOGRAM REPORT   Patient Name:   Angela Young Date of Exam: 02/12/2023 Medical Rec #:  [id number]      Height:       62.0 in Accession #:    [id number]     Weight:       120.0 lb Date of Birth:  1931/01/20     BSA:          1.539 m Patient Age:    89 years       BP:           127/70 mmHg Patient Gender: F              HR:           99 bpm. Exam Location:  Inpatient Procedure: 2D Echo, Cardiac Doppler and Color Doppler Indications:    CHF- Acute Diastolic  History:        Patient has no prior history of Echocardiogram examinations.                 Signs/Symptoms:Edema and Shortness of Breath. Chronic pain  syndrome.  Sonographer:    Milda Smart Referring Phys: [phone] JARED M GARDNER  Sonographer Comments: Suboptimal subcostal window. Image acquisition challenging due to patient body habitus and Image acquisition challenging due to respiratory motion. IMPRESSIONS  1. Left ventricular ejection fraction, by estimation, is 60 to 65%. The left ventricle has normal function. The left ventricle has no regional wall motion abnormalities. Left ventricular diastolic parameters are consistent with Grade I diastolic dysfunction (impaired relaxation).  2. Right ventricular systolic function is normal. The right ventricular size is normal. Tricuspid regurgitation signal is inadequate for assessing PA pressure.  3. No evidence of mitral valve regurgitation.  4. The aortic valve was not well visualized. Aortic valve regurgitation is not visualized.  5. The inferior vena cava is normal in size with greater than 50% respiratory variability, suggesting right atrial pressure of 3 mmHg. FINDINGS  Left Ventricle: Left ventricular ejection fraction, by estimation,  is 60 to 65%. The left ventricle has normal function. The left ventricle has no regional wall motion abnormalities. The left ventricular internal cavity size was normal in size. There is  no left ventricular hypertrophy. Left ventricular diastolic parameters are consistent with Grade I diastolic dysfunction (impaired relaxation). Right Ventricle: The right ventricular size is normal. Right ventricular systolic function is normal. Tricuspid regurgitation signal is inadequate for assessing PA pressure. Left Atrium: Left atrial size was normal in size. Right Atrium: Right atrial size was normal in size. Pericardium: There is no evidence of pericardial effusion. Mitral Valve: There is mild calcification of the mitral valve leaflet(s). No evidence of mitral valve regurgitation. MV peak gradient, 6.4 mmHg. The mean mitral valve gradient is 3.0 mmHg. Tricuspid Valve: Tricuspid valve regurgitation is not demonstrated. Aortic Valve: The aortic valve was not well visualized. Aortic valve regurgitation is not visualized. Pulmonic Valve: Pulmonic valve regurgitation is not visualized. Aorta: The aortic root and ascending aorta are structurally normal, with no evidence of dilitation. Venous: The inferior vena cava is normal in size with greater than 50% respiratory variability, suggesting right atrial pressure of 3 mmHg. IAS/Shunts: No atrial level shunt detected by color flow Doppler.  LEFT VENTRICLE PLAX 2D LVIDd:         3.20 cm     Diastology LVIDs:         2.00 cm     LV e' medial:    5.44 cm/s LV PW:         1.00 cm     LV E/e' medial:  13.6 LV IVS:        0.90 cm     LV e' lateral:   6.06 cm/s LVOT diam:     2.00 cm     LV E/e' lateral: 12.2 LV SV:         65 LV SV Index:   42 LVOT Area:     3.14 cm  LV Volumes (MOD) LV vol d, MOD A2C: 39.5 ml LV vol d, MOD A4C: 40.6 ml LV vol s, MOD A2C: 15.2 ml LV vol s, MOD A4C: 12.8 ml LV SV MOD A2C:     24.3 ml LV SV MOD A4C:     40.6 ml LV SV MOD BP:      28.1 ml RIGHT VENTRICLE              IVC RV S prime:     13.80 cm/s  IVC diam: 1.00 cm LEFT ATRIUM             Index  RIGHT ATRIUM           Index LA diam:        2.40 cm 1.56 cm/m   RA Area:     10.40 cm LA Vol (A2C):   45.0 ml 29.25 ml/m  RA Volume:   18.00 ml  11.70 ml/m LA Vol (A4C):   42.0 ml 27.30 ml/m LA Biplane Vol: 44.9 ml 29.18 ml/m  AORTIC VALVE LVOT Vmax:   112.00 cm/s LVOT Vmean:  80.400 cm/s LVOT VTI:    0.207 m  AORTA Ao Root diam: 3.10 cm Ao Asc diam:  3.00 cm MITRAL VALVE                TRICUSPID VALVE MV Area (PHT): 3.43 cm     TR Mean grad:   12.0 mmHg MV Area VTI:   2.66 cm     TR Vmean:       170.0 cm/s MV Peak grad:  6.4 mmHg MV Mean grad:  3.0 mmHg     SHUNTS MV Vmax:       1.26 m/s     Systemic VTI:  0.21 m MV Vmean:      88.2 cm/s    Systemic Diam: 2.00 cm MV Decel Time: 221 msec MV E velocity: 74.10 cm/s MV A velocity: 104.00 cm/s MV E/A ratio:  0.71 [place] signed by Carolan Clines Signature Date/Time: 02/12/2023/1:42:25 PM    Final    CT RENAL STONE STUDY  Result Date: 02/12/2023 CLINICAL DATA:  AKI, rule out obstruction. Recent diarrheal illness. Shortness of breath and leg swelling. EXAM: CT ABDOMEN AND PELVIS WITHOUT CONTRAST TECHNIQUE: Multidetector CT imaging of the abdomen and pelvis was performed following the standard protocol without IV contrast. RADIATION DOSE REDUCTION: This exam was performed according to the departmental dose-optimization program which includes automated exposure control, adjustment of the mA and/or kV according to patient size and/or use of iterative reconstruction technique. COMPARISON:  None Available. FINDINGS: Lower chest: Heart is mildly enlarged and coronary artery calcifications are noted. There is a moderate to large hiatal hernia. There is a trace right pleural effusion with consolidation in the right lower lobe. Atelectasis is present at the left lung base. Hepatobiliary: Subcentimeter hypodensities are present in the liver, statistically most  likely cysts or hemangiomas. Stones are noted in the gallbladder. No biliary ductal dilatation. Pancreas: Pancreatic atrophy is noted. No pancreatic ductal dilatation or surrounding inflammatory changes. Spleen: Normal in size without focal abnormality. Adrenals/Urinary Tract: The adrenal glands are within normal limits. Nonobstructive renal calculi are noted bilaterally. There is mild-to-moderate hydroureteronephrosis on the right with a 6 mm calculus in the distal right ureter at the ureterovesicular junction, best seen on coronal image 59. No obstructive uropathy on the left. The bladder is unremarkable. Stomach/Bowel: A moderate-to-large hiatal hernia is present. Appendix appears normal. No evidence of bowel wall thickening, distention, or inflammatory changes. No free air or pneumatosis. Scattered diverticula are present along the colon without evidence of diverticulitis. Vascular/Lymphatic: Aortic atherosclerosis. No enlarged abdominal or pelvic lymph nodes. Reproductive: The uterus is not seen.  No adnexal mass. Other: No abdominopelvic ascites. Musculoskeletal: Degenerative changes are present in the thoracolumbar spine. Bilateral hip arthroplasty changes are noted. No acute osseous abnormality. IMPRESSION: 1. Mild-to-moderate obstructive uropathy on the right with a 6 mm calculus in the distal right ureter at the UVJ. 2. Bilateral nephrolithiasis. 3. Small right pleural effusion with atelectasis or infiltrate at the right lung base. 4. Cholelithiasis. 5. Moderate-to-large hiatal  hernia. 6. Diverticulosis without diverticulitis. 7. Aortic atherosclerosis. Electronically Signed   By: Thornell Sartorius M.D.   On: 02/12/2023 04:36   DG Chest 2 View  Result Date: 02/11/2023 CLINICAL DATA:  Shortness of breath EXAM: CHEST - 2 VIEW COMPARISON:  None Available. FINDINGS: Small bilateral pleural effusions. Patchy airspace disease at the right base. Borderline cardiomegaly with aortic atherosclerosis. No  pneumothorax. IMPRESSION: Small bilateral pleural effusions with patchy airspace disease at the right base which may be due to atelectasis or pneumonia. Borderline cardiomegaly. Electronically Signed   By: Jasmine Pang M.D.   On: 02/11/2023 17:32    Assessment/Plan: 89yo with right ureteral calculus Continue medical expulsive therapy. If she fails to pass her stone she will be scheduled for right ureteroscopic stone extraction tomorrow   LOS: 1 day   Wilkie Aye 02/13/2023, 12:52 PM

## 2023-02-13 NOTE — Evaluation (Signed)
Physical Therapy Evaluation Patient Details Name: TEPHANIE ARCH MRN: [id number] DOB: [birth year] Today's Date: 02/13/2023  History of Present Illness  Ms Holland is a 89yo who was admitted 4/11 with abdominal pain and AKI. She has been having intermittent mild to moderate abdominal pain for 3 weeks. She had associated urinary urgency and frequency. She has been treated twice in the past month for a UTI.  CT obtained in the ER shows a 38mm right distal ureteral calculus with mild to moderate hydronephrosis. Transfer to [facility] 4/13 and if she fails to pass her stone she will be scheduled for right ureteroscopic stone extraction.  PMH:  right hip surgery, left TKA, arthritis  Clinical Impression  Pt admitted with above diagnosis. Pt was able to ambulate with RW with poor postural awareness and impulsivity with gait needing min assist. Pt states she is weaker than she was PTA.  Pt has bil LE weakness with a "bad right knee" per pt and needs right knee surgery. Pt has had left knee replaced.  Has valgus deformity bil LEs.  Will follow acutely.  Pt currently with functional limitations due to the deficits listed below (see PT Problem List). Pt will benefit from acute skilled PT to increase their independence and safety with mobility to allow discharge.          Recommendations for follow up therapy are one component of a multi-disciplinary discharge planning process, led by the attending physician.  Recommendations may be updated based on patient status, additional functional criteria and insurance authorization.  Follow Up Recommendations       Assistance Recommended at Discharge Intermittent Supervision/Assistance  Patient can return home with the following  A little help with walking and/or transfers;A little help with bathing/dressing/bathroom;Assistance with cooking/housework;Assist for transportation;Help with stairs or ramp for entrance    Equipment Recommendations None recommended by PT   Recommendations for Other Services       Functional Status Assessment Patient has had a recent decline in their functional status and demonstrates the ability to make significant improvements in function in a reasonable and predictable amount of time.     Precautions / Restrictions Precautions Precautions: Fall Restrictions Weight Bearing Restrictions: No      Mobility  Bed Mobility Overal bed mobility: Needs Assistance Bed Mobility: Supine to Sit     Supine to sit: Supervision     General bed mobility comments: Cues given with incr time but pt came to EOB without assist.    Transfers Overall transfer level: Needs assistance Equipment used: Rolling walker (2 wheels) Transfers: Sit to/from Stand Sit to Stand: Min assist           General transfer comment: Pt needed assist to power up from all surfaces.  Cues as well as pt impulsive.    Ambulation/Gait Ambulation/Gait assistance: Min assist Gait Distance (Feet): 18 Feet (18 feet x 2) Assistive device: Rolling walker (2 wheels) Gait Pattern/deviations: Step-through pattern, Decreased stride length, Decreased stance time - right, Decreased weight shift to right, Antalgic, Trunk flexed, Wide base of support   Gait velocity interpretation: <1.31 ft/sec, indicative of household ambulator   General Gait Details: Pt stopped at sink and brushed dentures and then walked into bathroom to urinate.  Pt needing assist to rise from low toilet and she did clean herslef. She walked back to bed as IV team was in room to get another IV.  Pt also kyphotic in posture with decr postural statibiltiy at times and poor safety awareness overall.  Pt states she is close to baseline gait but is a little weaker.  Stairs            Wheelchair Mobility    Modified Rankin (Stroke Patients Only)       Balance Overall balance assessment: Needs assistance Sitting-balance support: No upper extremity supported, Feet supported Sitting  balance-Leahy Scale: Fair     Standing balance support: Bilateral upper extremity supported, During functional activity, Reliant on assistive device for balance                                 Pertinent Vitals/Pain Pain Assessment Pain Assessment: Faces Faces Pain Scale: Hurts whole lot Pain Location: back and right LE Pain Descriptors / Indicators: Discomfort, Grimacing, Guarding Pain Intervention(s): Limited activity within patient's tolerance, Monitored during session, Repositioned    Home Living Family/patient expects to be discharged to:: Private residence Living Arrangements: Alone Available Help at Discharge: Family;Available PRN/intermittently (granddaughter) Type of Home: House Home Access: Stairs to enter Entrance Stairs-Rails: Left Entrance Stairs-Number of Steps: 2   Home Layout: One level Home Equipment: Agricultural consultant (2 wheels);Hand held shower head;Tub bench (has 3 walkers - one outside, one in car and one in house)      Prior Function Prior Level of Function : Independent/Modified Independent                     Hand Dominance   Dominant Hand: Right    Extremity/Trunk Assessment   Upper Extremity Assessment Upper Extremity Assessment: Defer to OT evaluation    Lower Extremity Assessment Lower Extremity Assessment: RLE deficits/detail;LLE deficits/detail RLE Deficits / Details: valgus deformity slight as pt with knee surgery; grossly 3-/5 LLE Deficits / Details: significant valgus deformity, grossly 3/5    Cervical / Trunk Assessment Cervical / Trunk Assessment: Kyphotic  Communication   Communication: No difficulties  Cognition Arousal/Alertness: Awake/alert Behavior During Therapy: WFL for tasks assessed/performed Overall Cognitive Status: Within Functional Limits for tasks assessed                                 General Comments: Some decr safety awareness noted with pt picking up RW at times instead of  sliding it.  Pt impulsive as well.        General Comments General comments (skin integrity, edema, etc.): VSS    Exercises     Assessment/Plan    PT Assessment Patient needs continued PT services  PT Problem List Decreased activity tolerance;Decreased balance;Decreased mobility;Decreased knowledge of use of DME;Decreased safety awareness;Decreased knowledge of precautions       PT Treatment Interventions DME instruction;Gait training;Functional mobility training;Therapeutic activities;Therapeutic exercise;Balance training;Patient/family education;Stair training    PT Goals (Current goals can be found in the Care Plan section)  Acute Rehab PT Goals Patient Stated Goal: to go home PT Goal Formulation: With patient Time For Goal Achievement: 02/27/23 Potential to Achieve Goals: Good    Frequency Min 3X/week     Co-evaluation               AM-PAC PT "6 Clicks" Mobility  Outcome Measure Help needed turning from your back to your side while in a flat bed without using bedrails?: None Help needed moving from lying on your back to sitting on the side of a flat bed without using bedrails?: None Help needed moving to and from a  bed to a chair (including a wheelchair)?: A Little Help needed standing up from a chair using your arms (e.g., wheelchair or bedside chair)?: A Little Help needed to walk in hospital room?: A Little Help needed climbing 3-5 steps with a railing? : Total 6 Click Score: 18    End of Session Equipment Utilized During Treatment: Gait belt Activity Tolerance: Patient limited by fatigue Patient left: in bed;with call bell/phone within reach;with bed alarm set Nurse Communication: Mobility status PT Visit Diagnosis: Muscle weakness (generalized) (M62.81)    Time: 2130-8657 PT Time Calculation (min) (ACUTE ONLY): 13 min   Charges:   PT Evaluation $PT Eval Low Complexity: 1 Low          Ilah Boule M,PT Acute Rehab Services [phone]   Bevelyn Buckles 02/13/2023, 4:30 PM

## 2023-02-14 ENCOUNTER — Inpatient Hospital Stay (HOSPITAL_COMMUNITY): Payer: Medicare Other | Admitting: Certified Registered Nurse Anesthetist

## 2023-02-14 ENCOUNTER — Encounter (HOSPITAL_COMMUNITY)
Admission: EM | Disposition: A | Discharge status: Home / Self Care | Payer: Self-pay | Source: Home / Self Care | Attending: Internal Medicine

## 2023-02-14 ENCOUNTER — Inpatient Hospital Stay (HOSPITAL_COMMUNITY): Payer: Medicare Other

## 2023-02-14 DIAGNOSIS — N179 Acute kidney failure, unspecified: Secondary | ICD-10-CM | POA: Diagnosis not present

## 2023-02-14 DIAGNOSIS — N201 Calculus of ureter: Secondary | ICD-10-CM | POA: Diagnosis not present

## 2023-02-14 HISTORY — PX: CYSTOSCOPY/URETEROSCOPY/HOLMIUM LASER/STENT PLACEMENT: SHX6546

## 2023-02-14 LAB — URINE CULTURE: Culture: >=100000 — AB

## 2023-02-14 LAB — CBC WITH DIFFERENTIAL/PLATELET
Abs Immature Granulocytes: 0.04 10*3/uL (ref 0.00–0.07)
Basophils Absolute: 0 10*3/uL (ref 0.0–0.1)
Basophils Relative: 1 %
Eosinophils Absolute: 0.1 10*3/uL (ref 0.0–0.5)
Eosinophils Relative: 3 %
HCT: 34 % — ABNORMAL LOW (ref 36.0–46.0)
Hemoglobin: 11.1 g/dL — ABNORMAL LOW (ref 12.0–15.0)
Immature Granulocytes: 1 %
Lymphocytes Relative: 23 %
Lymphs Abs: 1.1 10*3/uL (ref 0.7–4.0)
MCH: 32.6 pg (ref 26.0–34.0)
MCHC: 32.6 g/dL (ref 30.0–36.0)
MCV: 99.7 fL (ref 80.0–100.0)
Monocytes Absolute: 0.5 10*3/uL (ref 0.1–1.0)
Monocytes Relative: 10 %
Neutro Abs: 2.9 10*3/uL (ref 1.7–7.7)
Neutrophils Relative %: 62 %
Platelets: 285 10*3/uL (ref 150–400)
RBC: 3.41 MIL/uL — ABNORMAL LOW (ref 3.87–5.11)
RDW: 12.6 % (ref 11.5–15.5)
WBC: 4.7 10*3/uL (ref 4.0–10.5)
nRBC: 0 % (ref 0.0–0.2)

## 2023-02-14 LAB — BASIC METABOLIC PANEL
Anion gap: 4 — ABNORMAL LOW (ref 5–15)
BUN: 27 mg/dL — ABNORMAL HIGH (ref 8–23)
CO2: 23 mmol/L (ref 22–32)
Calcium: 7.8 mg/dL — ABNORMAL LOW (ref 8.9–10.3)
Chloride: 105 mmol/L (ref 98–111)
Creatinine, Ser: 1.13 mg/dL — ABNORMAL HIGH (ref 0.44–1.00)
GFR, Estimated: 47 mL/min — ABNORMAL LOW (ref >60)
Glucose, Bld: 101 mg/dL — ABNORMAL HIGH (ref 70–99)
Potassium: 3.7 mmol/L (ref 3.5–5.1)
Sodium: 132 mmol/L — ABNORMAL LOW (ref 135–145)

## 2023-02-14 LAB — MAGNESIUM: Magnesium: 1.5 mg/dL — ABNORMAL LOW (ref 1.7–2.4)

## 2023-02-14 LAB — PHOSPHORUS: Phosphorus: 2.9 mg/dL (ref 2.5–4.6)

## 2023-02-14 SURGERY — CYSTOSCOPY/URETEROSCOPY/HOLMIUM LASER/STENT PLACEMENT
Anesthesia: General | Laterality: Right

## 2023-02-14 MED ORDER — FENTANYL CITRATE (PF) 100 MCG/2ML IJ SOLN
INTRAMUSCULAR | Status: AC
  Filled 2023-02-14: qty 2

## 2023-02-14 MED ORDER — MAGIC MOUTHWASH W/LIDOCAINE
5.0000 mL | Freq: Four times a day (QID) | ORAL | Status: DC
  Administered 2023-02-14 – 2023-02-15 (×5): 5 mL via ORAL
  Filled 2023-02-14 (×7): qty 5

## 2023-02-14 MED ORDER — FENTANYL CITRATE PF 50 MCG/ML IJ SOSY
PREFILLED_SYRINGE | INTRAMUSCULAR | Status: AC
  Filled 2023-02-14: qty 1

## 2023-02-14 MED ORDER — FENTANYL CITRATE PF 50 MCG/ML IJ SOSY
25.0000 ug | PREFILLED_SYRINGE | INTRAMUSCULAR | Status: DC | PRN
  Administered 2023-02-14: 50 ug via INTRAVENOUS

## 2023-02-14 MED ORDER — DEXAMETHASONE SODIUM PHOSPHATE 10 MG/ML IJ SOLN
INTRAMUSCULAR | Status: DC | PRN
  Administered 2023-02-14: 10 mg via INTRAVENOUS

## 2023-02-14 MED ORDER — OXYCODONE HCL 5 MG PO TABS: 5.0000 mg | ORAL_TABLET | Freq: Once | ORAL | Status: DC | PRN

## 2023-02-14 MED ORDER — OXYCODONE HCL 5 MG/5ML PO SOLN: 5.0000 mg | Freq: Once | ORAL | Status: DC | PRN

## 2023-02-14 MED ORDER — PROPOFOL 500 MG/50ML IV EMUL
INTRAVENOUS | Status: AC
  Filled 2023-02-14: qty 50

## 2023-02-14 MED ORDER — ONDANSETRON HCL 4 MG/2ML IJ SOLN
INTRAMUSCULAR | Status: AC
  Filled 2023-02-14: qty 2

## 2023-02-14 MED ORDER — LACTATED RINGERS IV SOLN: INTRAVENOUS | Status: DC | PRN

## 2023-02-14 MED ORDER — PHENYLEPHRINE 80 MCG/ML (10ML) SYRINGE FOR IV PUSH (FOR BLOOD PRESSURE SUPPORT)
PREFILLED_SYRINGE | INTRAVENOUS | Status: DC | PRN
  Administered 2023-02-14: 80 ug via INTRAVENOUS
  Administered 2023-02-14: 160 ug via INTRAVENOUS
  Administered 2023-02-14: 80 ug via INTRAVENOUS

## 2023-02-14 MED ORDER — ONDANSETRON HCL 4 MG/2ML IJ SOLN
INTRAMUSCULAR | Status: DC | PRN
  Administered 2023-02-14: 4 mg via INTRAVENOUS

## 2023-02-14 MED ORDER — PROPOFOL 10 MG/ML IV BOLUS
INTRAVENOUS | Status: AC
  Filled 2023-02-14: qty 20

## 2023-02-14 MED ORDER — MAGNESIUM SULFATE 2 GM/50ML IV SOLN
2.0000 g | Freq: Once | INTRAVENOUS | Status: AC
  Administered 2023-02-14: 2 g via INTRAVENOUS
  Filled 2023-02-14: qty 50

## 2023-02-14 MED ORDER — SODIUM CHLORIDE 0.9 % IR SOLN
Status: DC | PRN
  Administered 2023-02-14: 3000 mL via INTRAVESICAL

## 2023-02-14 MED ORDER — FUROSEMIDE 10 MG/ML IJ SOLN
20.0000 mg | Freq: Once | INTRAMUSCULAR | Status: AC
  Administered 2023-02-14: 20 mg via INTRAVENOUS
  Filled 2023-02-14: qty 2

## 2023-02-14 MED ORDER — FENTANYL CITRATE (PF) 100 MCG/2ML IJ SOLN
INTRAMUSCULAR | Status: DC | PRN
  Administered 2023-02-14 (×2): 50 ug via INTRAVENOUS

## 2023-02-14 MED ORDER — DEXMEDETOMIDINE HCL IN NACL 80 MCG/20ML IV SOLN
INTRAVENOUS | Status: AC
  Filled 2023-02-14: qty 40

## 2023-02-14 MED ORDER — CEFAZOLIN SODIUM 1 G IJ SOLR
INTRAMUSCULAR | Status: AC
  Filled 2023-02-14: qty 20

## 2023-02-14 MED ORDER — LIDOCAINE 2% (20 MG/ML) 5 ML SYRINGE
INTRAMUSCULAR | Status: DC | PRN
  Administered 2023-02-14: 50 mg via INTRAVENOUS

## 2023-02-14 MED ORDER — NYSTATIN 100000 UNIT/ML MT SUSP
5.0000 mL | Freq: Four times a day (QID) | OROMUCOSAL | Status: DC
  Administered 2023-02-14 – 2023-02-15 (×5): 500000 [IU] via ORAL
  Filled 2023-02-14 (×5): qty 5

## 2023-02-14 MED ORDER — PROPOFOL 500 MG/50ML IV EMUL
INTRAVENOUS | Status: DC | PRN
  Administered 2023-02-14: 20 mg via INTRAVENOUS
  Administered 2023-02-14: 80 mg via INTRAVENOUS

## 2023-02-14 MED ORDER — IOHEXOL 300 MG/ML  SOLN
INTRAMUSCULAR | Status: DC | PRN
  Administered 2023-02-14: 2 mL

## 2023-02-14 MED ORDER — PROPOFOL 1000 MG/100ML IV EMUL
INTRAVENOUS | Status: AC
  Filled 2023-02-14: qty 100

## 2023-02-14 SURGICAL SUPPLY — 22 items
BAG URO CATCHER STRL LF (MISCELLANEOUS) ×1 IMPLANT
CATH URETL OPEN END 6FR 70 (CATHETERS) ×1 IMPLANT
CLOTH BEACON ORANGE TIMEOUT ST (SAFETY) ×1 IMPLANT
EXTRACTOR STONE NITINOL NGAGE (UROLOGICAL SUPPLIES) IMPLANT
GLOVE BIO SURGEON STRL SZ8 (GLOVE) ×1 IMPLANT
GOWN STRL REUS W/ TWL XL LVL3 (GOWN DISPOSABLE) ×1 IMPLANT
GOWN STRL REUS W/TWL XL LVL3 (GOWN DISPOSABLE) ×1
GUIDEWIRE ANG ZIPWIRE 038X150 (WIRE) ×1 IMPLANT
GUIDEWIRE STR DUAL SENSOR (WIRE) IMPLANT
IV NS 1000ML (IV SOLUTION) ×1
IV NS 1000ML BAXH (IV SOLUTION) ×1 IMPLANT
KIT TURNOVER KIT A (KITS) IMPLANT
MANIFOLD NEPTUNE II (INSTRUMENTS) ×1 IMPLANT
PACK CYSTO (CUSTOM PROCEDURE TRAY) ×1 IMPLANT
SHEATH NAVIGATOR HD 11/13X36 (SHEATH) IMPLANT
STENT URET 6FRX24 CONTOUR (STENTS) IMPLANT
STENT URET 6FRX26 CONTOUR (STENTS) IMPLANT
TRACTIP FLEXIVA PULS ID 200XHI (Laser) IMPLANT
TRACTIP FLEXIVA PULSE ID 200 (Laser) IMPLANT
TUBE PU 8FR 16IN ENFIT (TUBING) IMPLANT
TUBING CONNECTING 10 (TUBING) ×1 IMPLANT
TUBING UROLOGY SET (TUBING) IMPLANT

## 2023-02-14 NOTE — Anesthesia Postprocedure Evaluation (Signed)
Anesthesia Post Note  Patient: DEZTINI HENES  Procedure(s) Performed: CYSTOSCOPY; RIGHT URETEROSCOPY; HOLMIUM LASER; BASKET STONE EXTRACTION; RIGHT URETERAL STENT PLACEMENT (Right)     Patient location during evaluation: PACU Anesthesia Type: General Level of consciousness: awake and alert Pain management: pain level controlled Vital Signs Assessment: post-procedure vital signs reviewed and stable Respiratory status: spontaneous breathing, nonlabored ventilation and respiratory function stable Cardiovascular status: blood pressure returned to baseline Postop Assessment: no apparent nausea or vomiting Anesthetic complications: no   No notable events documented.  Last Vitals:  Vitals:   02/14/23 0900 02/14/23 0932  BP: 115/74 98/77  Pulse: (!) 107 (!) 101  Resp: 19   Temp: 36.4 C 36.6 C  SpO2: 91% 95%    Last Pain:  Vitals:   02/14/23 0932  TempSrc: Oral  PainSc:                  Shanda Howells

## 2023-02-14 NOTE — Interval H&P Note (Signed)
History and Physical Interval Note:  02/14/2023 7:42 AM  Angela Young  has presented today for surgery, with the diagnosis of RIGHT URETERAL STONE.  The various methods of treatment have been discussed with the patient and family. After consideration of risks, benefits and other options for treatment, the patient has consented to  Procedure(s): CYSTOSCOPY/URETEROSCOPY/HOLMIUM LASER/STENT PLACEMENT (Right) as a surgical intervention.  The patient's history has been reviewed, patient examined, no change in status, stable for surgery.  I have reviewed the patient's chart and labs.  Questions were answered to the patient's satisfaction.     Wilkie Aye

## 2023-02-14 NOTE — Op Note (Signed)
Preoperative diagnosis: Right ureteral stone  Postoperative diagnosis: Same  Procedure: 1 cystoscopy 2 right retrograde pyelography 3.  Intraoperative fluoroscopy, under one hour, with interpretation 4.  Right ureteroscopic stone manipulation with laser lithotripsy 5.  Right 6 x 24 JJ stent placement  Attending: Cleda Mccreedy  Anesthesia: General  Estimated blood loss: None  Drains: Right 6 x 24 JJ ureteral stent without tether  Specimens: stone for analysis  Antibiotics: zosyn  Findings: right distal ureteral calculus. Moderate right hydronephrosis.  Indications: Patient is a 89 year old female with a history of ureteral stone and who has failed medical expulsive therapy.  After discussing treatment options, she decided proceed with right ureteroscopic stone manipulation.  Procedure in detail: The patient was brought to the operating room and a brief timeout was done to ensure correct patient, correct procedure, correct site.  General anesthesia was administered patient was placed in dorsal lithotomy position.  Her genitalia was then prepped and draped in usual sterile fashion.  A rigid 22 French cystoscope was passed in the urethra and the bladder.  Bladder was inspected free masses or lesions.  the right ureteral orifices were in the normal orthotopic locations.  a 6 french ureteral catheter was then instilled into the right ureter orifice.  a gentle retrograde was obtained and findings noted above.  we then placed a zip wire through the ureteral catheter and advanced up to the renal pelvis.  we then removed the cystoscope and cannulated the right ureteral orifice with a semirigid ureteroscope.  we then encountered the stone in the distal ureter.  using a 200 nm laser fiber and fragmented the stone into smaller pieces.  the pieces were then removed with a Ngage basket.  once all stone fragments were removed we then placed a 6 x 24 double-j ureteral stent over the original zip wire.  We then removed th wire and good coil was noted in the the renal pelvis under fluoroscopy and the bladder under direct vision.    the stone fragments were then removed from the bladder and sent for analysis.   the bladder was then drained and this concluded the procedure which was well tolerated by patient.  Complications: None  Condition: Stable, extubated, transferred to PACU  Plan: Patient is to be admitted for observation. She can be discharged home tomorrow and follow-up in one week for stent removal.

## 2023-02-14 NOTE — Transfer of Care (Signed)
Immediate Anesthesia Transfer of Care Note  Patient: Angela Young  Procedure(s) Performed: Procedure(s): CYSTOSCOPY; RIGHT URETEROSCOPY; HOLMIUM LASER; BASKET STONE EXTRACTION; RIGHT URETERAL STENT PLACEMENT (Right)  Patient Location: PACU  Anesthesia Type:General  Level of Consciousness: Alert, Awake, Oriented  Airway & Oxygen Therapy: Patient Spontanous Breathing  Post-op Assessment: Report given to RN  Post vital signs: Reviewed and stable  Last Vitals:  Vitals:   02/13/23 2153 02/14/23 0701  BP: 117/67 (!) 150/85  Pulse: 94 (!) 107  Resp: 18 16  Temp: 36.5 C (!) 36.2 C  SpO2: 93% 97%    Complications: No apparent anesthesia complications

## 2023-02-14 NOTE — Anesthesia Preprocedure Evaluation (Signed)
Anesthesia Evaluation  Patient identified by MRN, date of birth, ID band Patient awake    Reviewed: Allergy & Precautions, NPO status , Patient's Chart, lab work & pertinent test results  History of Anesthesia Complications Negative for: history of anesthetic complications  Airway Mallampati: II  TM Distance: >3 FB Neck ROM: Full    Dental  (+) Poor Dentition,    Pulmonary former smoker   Pulmonary exam normal        Cardiovascular negative cardio ROS Normal cardiovascular exam     Neuro/Psych    Depression       GI/Hepatic Neg liver ROS, hiatal hernia,GERD  Controlled,,  Endo/Other  negative endocrine ROS    Renal/GU Right ureteral stone     Musculoskeletal  (+) Arthritis ,    Abdominal   Peds  Hematology  (+) Blood dyscrasia (Hgb 11.1), anemia   Anesthesia Other Findings Chronic pain syndrome  Reproductive/Obstetrics                              Anesthesia Physical Anesthesia Plan  ASA: 2 and emergent  Anesthesia Plan: General   Post-op Pain Management: Tylenol PO (pre-op)*   Induction: Intravenous  PONV Risk Score and Plan: 3 and Treatment may vary due to age or medical condition, Dexamethasone and Ondansetron  Airway Management Planned: LMA  Additional Equipment: None  Intra-op Plan:   Post-operative Plan: Extubation in OR  Informed Consent: I have reviewed the patients History and Physical, chart, labs and discussed the procedure including the risks, benefits and alternatives for the proposed anesthesia with the patient or authorized representative who has indicated his/her understanding and acceptance.     Dental advisory given  Plan Discussed with: CRNA  Anesthesia Plan Comments:          Anesthesia Quick Evaluation

## 2023-02-14 NOTE — Anesthesia Procedure Notes (Signed)
Procedure Name: LMA Insertion Date/Time: 02/14/2023 8:00 AM  Performed by: Basilio Cairo, CRNAPre-anesthesia Checklist: Patient identified, Patient being monitored, Timeout performed, Emergency Drugs available and Suction available Patient Re-evaluated:Patient Re-evaluated prior to induction Oxygen Delivery Method: Circle system utilized Preoxygenation: Pre-oxygenation with 100% oxygen Induction Type: IV induction Ventilation: Mask ventilation without difficulty LMA: LMA inserted LMA Size: 4.0 Tube type: Oral Number of attempts: 1 Placement Confirmation: positive ETCO2 and breath sounds checked- equal and bilateral Tube secured with: Tape Dental Injury: Teeth and Oropharynx as per pre-operative assessment

## 2023-02-14 NOTE — Progress Notes (Signed)
PROGRESS NOTE  Angela Young KYH:[id number] DOB: January 04, 1931 DOA: 02/11/2023 PCP: [facility]   LOS: 2 days   Brief Narrative / Interim history: 89 year old female, living independently, with history of chronic pain syndrome comes into the hospital with worsening lower extremity swelling in the last 3 days, and 2 weeks of nausea, vomiting, diarrhea.  In the ED she was found to have acute kidney injury, lower extremity edema as well as right obstructing stone at UVJ with hydronephrosis.  Urology was consulted, and she underwent stent placement 4/14.  She was initially admitted to [facility] then transferred to [facility]  Subjective / 24h Interval events: Seen postoperatively, overall feeling better.  She is complaining of mouth ulcers that have been bothering her since she got sick couple weeks ago.  She has never had those before.  She denies any chest pain, she denies any shortness of breath.  She is very hard of hearing  Assesement and Plan: Principal Problem:   AKI (acute kidney injury) Active Problems:   Peripheral edema   Nephrolithiasis  Principal problem Acute kidney injury - due to obstructive 6 mm right UVJ stone.  Urology consulted as below, was given IV fluids and renal function has now normalized  Active problems Obstructive 6 mm right UVJ stone, nephrolithiasis, UTI-urology consulted, she was taken to the OR 4/14 and is status post laser lithotripsy as well as a right JJ stent placement.  Urine cultures are growing 10>5 CFU's of Enterobacter aerogenes resistant to ceftriaxone nitrofurantoin but otherwise sensitive.  Continue Zosyn.  Could potentially be transition to Cipro upon discharge  Mouth sores -add nystatin as could not fully rule out fungal infection, continue lidocaine mouthwash  Anasarca, bilateral lower extremity edema, pleural effusion -due to acute kidney injury, underwent an echo that showed preserved LVEF, grade 1 diastolic  dysfunction.  Much improved after Lasix   Hypokalemia, hypomagnesemia-continue to monitor and replace potassium and magnesium as indicated    Mildly elevated LFT - imaging showing sub-centimeter hypodensities in the liver, statistically most likely cysts or hemangiomas. Stones are noted in the gallbladder. No biliary ductal dilatation.  LFTs have now normalized  Other incidental findings on CT scan Moderate-to-large hiatal hernia. Diverticulosis without diverticulitis. Aortic atherosclerosis.  Scheduled Meds:  fentaNYL       heparin  5,000 Units Subcutaneous Q8H   midodrine  5 mg Oral TID WC   tamsulosin  0.4 mg Oral QPC supper   Continuous Infusions:  lactated ringers 75 mL/hr at 02/13/23 1322   piperacillin-tazobactam (ZOSYN)  IV 3.375 g (02/14/23 0607)   PRN Meds:.acetaminophen **OR** acetaminophen, fentaNYL, lidocaine, Muscle Rub, ondansetron **OR** ondansetron (ZOFRAN) IV, oxyCODONE  Current Outpatient Medications  Medication Instructions   ibuprofen (ADVIL) 200 mg, Oral, Every 6 hours PRN    Diet Orders (From admission, onward)     Start     Ordered   02/14/23 0934  Diet regular Room service appropriate? Yes; Fluid consistency: Thin  Diet effective now       Question Answer Comment  Room service appropriate? Yes   Fluid consistency: Thin      02/14/23 0933            DVT prophylaxis: Place and maintain sequential compression device Start: 02/14/23 0633 heparin injection 5,000 Units Start: 02/13/23 0600   Lab Results  Component Value Date   PLT 285 02/14/2023      Code Status: Full Code  Family Communication: No family present at bedside  Status  is: Inpatient  Remains inpatient appropriate because: severity of illness  Level of care: Med-Surg  Consultants:  Urology   Objective: Vitals:   02/14/23 0841 02/14/23 0845 02/14/23 0900 02/14/23 0932  BP: (!) 129/110 123/81 115/74 98/77  Pulse: 100 (!) 101 (!) 107 (!) 101  Resp: 20 (!) 21 19    Temp: 97.7 F (36.5 C)  97.6 F (36.4 C) 97.8 F (36.6 C)  TempSrc:    Oral  SpO2: 100% 90% 91% 95%  Weight:      Height:        Intake/Output Summary (Last 24 hours) at 02/14/2023 0940 Last data filed at 02/14/2023 0836 Gross per 24 hour  Intake 1318 ml  Output 350 ml  Net 968 ml   Wt Readings from Last 3 Encounters:  02/11/23 54.4 kg  12/25/21 54.4 kg  07/17/21 54.4 kg    Examination:  Constitutional: NAD Eyes: no scleral icterus ENMT: Mucous membranes are moist.  Neck: normal, supple Respiratory: clear to auscultation bilaterally, no wheezing, no crackles. Normal respiratory effort. No accessory muscle use.  Cardiovascular: Regular rate and rhythm, no murmurs / rubs / gallops.  Trace edema Abdomen: non distended, no tenderness. Bowel sounds positive.  Musculoskeletal: no clubbing / cyanosis.   Data Reviewed: I have independently reviewed following labs and imaging studies   CBC Recent Labs  Lab 02/11/23 1630 02/13/23 0232 02/14/23 0425  WBC 7.2 5.1 4.7  HGB 13.7 11.1* 11.1*  HCT 40.6 33.8* 34.0*  PLT 306 297 285  MCV 99.0 98.8 99.7  MCH 33.4 32.5 32.6  MCHC 33.7 32.8 32.6  RDW 12.7 12.7 12.6  LYMPHSABS 1.1  --  1.1  MONOABS 0.6  --  0.5  EOSABS 0.1  --  0.1  BASOSABS 0.0  --  0.0    Recent Labs  Lab 02/11/23 1628 02/11/23 1630 02/13/23 0232 02/14/23 0425  NA  --  132* 135 132*  K  --  3.8 3.3* 3.7  CL  --  96* 98 105  CO2  --  GLUCOSE  --  106* 96 101*  BUN  --  33* 37* 27*  CREATININE  --  1.62* 1.50* 1.13*  CALCIUM  --  8.6* 7.9* 7.8*  AST  --  42* 18  --   ALT  --  38 24  --   ALKPHOS  --  96 72  --   BILITOT  --  0.8 0.6  --   ALBUMIN  --  2.2* 1.7*  --   MG  --   --   --  1.5*  BNP 98.2  --   --   --     ------------------------------------------------------------------------------------------------------------------ No results for input(s): "CHOL", "HDL", "LDLCALC", "TRIG", "CHOLHDL", "LDLDIRECT" in the last 72  hours.  No results found for: "HGBA1C" ------------------------------------------------------------------------------------------------------------------ No results for input(s): "TSH", "T4TOTAL", "T3FREE", "THYROIDAB" in the last 72 hours.  Invalid input(s): "FREET3"  Cardiac Enzymes No results for input(s): "CKMB", "TROPONINI", "MYOGLOBIN" in the last 168 hours.  Invalid input(s): "CK" ------------------------------------------------------------------------------------------------------------------    Component Value Date/Time   BNP 98.2 02/11/2023 1628    CBG: No results for input(s): "GLUCAP" in the last 168 hours.  Recent Results (from the past 240 hour(s))  Urine Culture     Status: Abnormal   Collection Time: 02/11/23  5:01 AM   Specimen: Urine, Clean Catch  Result Value Ref Range Status   Specimen Description URINE, CLEAN CATCH  Final   Special  Requests   Final    NONE Performed at [facility], [address], [place], [place] [address]    Culture >=100,000 COLONIES/mL ENTEROBACTER AEROGENES (A)  Final   Report Status 02/14/2023 FINAL  Final   Organism ID, Bacteria ENTEROBACTER AEROGENES (A)  Final      Susceptibility   Enterobacter aerogenes - MIC*    CEFEPIME <=0.12 SENSITIVE Sensitive     CEFTRIAXONE 16 RESISTANT Resistant     CIPROFLOXACIN <=0.25 SENSITIVE Sensitive     GENTAMICIN <=1 SENSITIVE Sensitive     IMIPENEM 0.5 SENSITIVE Sensitive     NITROFURANTOIN 64 INTERMEDIATE Intermediate     TRIMETH/SULFA <=20 SENSITIVE Sensitive     PIP/TAZO 16 SENSITIVE Sensitive     * >=100,000 COLONIES/mL ENTEROBACTER AEROGENES  SARS Coronavirus 2 by RT PCR (hospital order, performed in [facility] hospital lab) *cepheid single result test* Anterior Nasal Swab     Status: None   Collection Time: 02/11/23 11:30 PM   Specimen: Anterior Nasal Swab  Result Value Ref Range Status   SARS Coronavirus 2 by RT PCR NEGATIVE NEGATIVE Final    Comment: Performed at [facility], [address] [place], [place] [address]     Radiology Studies: DG C-Arm 1-60 Min-No Report  Result Date: 02/14/2023 Fluoroscopy was utilized by the requesting physician.  No radiographic interpretation.     Pamella Pert, MD, PhD [facility]  Between 7 am - 7 pm I am available, please contact me via Amion (for emergencies) or Securechat (non urgent messages)  Between 7 pm - 7 am I am not available, please contact night coverage MD/APP via Amion

## 2023-02-15 ENCOUNTER — Encounter (HOSPITAL_COMMUNITY): Payer: Self-pay | Admitting: Urology

## 2023-02-15 DIAGNOSIS — N179 Acute kidney failure, unspecified: Secondary | ICD-10-CM | POA: Diagnosis not present

## 2023-02-15 LAB — BASIC METABOLIC PANEL
Anion gap: 8 (ref 5–15)
BUN: 24 mg/dL — ABNORMAL HIGH (ref 8–23)
CO2: 24 mmol/L (ref 22–32)
Calcium: 8 mg/dL — ABNORMAL LOW (ref 8.9–10.3)
Chloride: 101 mmol/L (ref 98–111)
Creatinine, Ser: 1.26 mg/dL — ABNORMAL HIGH (ref 0.44–1.00)
GFR, Estimated: 41 mL/min — ABNORMAL LOW (ref >60)
Glucose, Bld: 150 mg/dL — ABNORMAL HIGH (ref 70–99)
Potassium: 3.6 mmol/L (ref 3.5–5.1)
Sodium: 133 mmol/L — ABNORMAL LOW (ref 135–145)

## 2023-02-15 LAB — MAGNESIUM: Magnesium: 2 mg/dL (ref 1.7–2.4)

## 2023-02-15 MED ORDER — MAGIC MOUTHWASH W/LIDOCAINE: 5.0000 mL | Freq: Four times a day (QID) | ORAL | 0 refills | Status: AC

## 2023-02-15 MED ORDER — TAMSULOSIN HCL 0.4 MG PO CAPS: 0.4000 mg | ORAL_CAPSULE | Freq: Every day | ORAL | 0 refills | Status: AC

## 2023-02-15 MED ORDER — NYSTATIN 100000 UNIT/ML MT SUSP: 5.0000 mL | Freq: Four times a day (QID) | OROMUCOSAL | 0 refills | Status: AC

## 2023-02-15 MED ORDER — CIPROFLOXACIN HCL 500 MG PO TABS: 500.0000 mg | ORAL_TABLET | Freq: Two times a day (BID) | ORAL | 0 refills | Status: DC

## 2023-02-15 MED ORDER — SENNOSIDES-DOCUSATE SODIUM 8.6-50 MG PO TABS
1.0000 | ORAL_TABLET | Freq: Two times a day (BID) | ORAL | Status: DC
  Administered 2023-02-15: 1 via ORAL
  Filled 2023-02-15: qty 1

## 2023-02-15 MED ORDER — CIPROFLOXACIN HCL 500 MG PO TABS: 500.0000 mg | ORAL_TABLET | Freq: Every day | ORAL | 0 refills | Status: AC

## 2023-02-15 NOTE — Progress Notes (Signed)
Physical Therapy Treatment Patient Details Name: Angela Young MRN: [id number] DOB: 10/30/1931 Today's Date: 02/15/2023   History of Present Illness Angela Young is a 89yo who was admitted 4/11 with abdominal pain and AKI. She has been having intermittent mild to moderate abdominal pain for 3 weeks. She had associated urinary urgency and frequency. She has been treated twice in the past month for a UTI.  CT obtained in the ER shows a 59mm right distal ureteral calculus with mild to moderate hydronephrosis. Transfer to [facility] 4/13 and if she fails to pass her stone she will be scheduled for right ureteroscopic stone extraction.  PMH:  right hip surgery, left TKA, arthritis    PT Comments    Pt agreeable to therapy. She reports generalized chronic pain. Cues for safety during session. Pt reports general weakness and fatigue. O2 94% on RA, HR 103 bpm. Assisted pt into recliner to sit up/spend time OOB. Possible d/c home today per pt. Will continue to follow and progress activity as tolerated.     Recommendations for follow up therapy are one component of a multi-disciplinary discharge planning process, led by the attending physician.  Recommendations may be updated based on patient status, additional functional criteria and insurance authorization.  Follow Up Recommendations       Assistance Recommended at Discharge Intermittent Supervision/Assistance  Patient can return home with the following A little help with walking and/or transfers;A little help with bathing/dressing/bathroom;Assistance with cooking/housework;Assist for transportation;Help with stairs or ramp for entrance   Equipment Recommendations  None recommended by PT    Recommendations for Other Services       Precautions / Restrictions Precautions Precautions: Fall Restrictions Weight Bearing Restrictions: No     Mobility  Bed Mobility Overal bed mobility: Needs Assistance Bed Mobility: Supine to Sit     Supine to sit:  Supervision     General bed mobility comments: for safety, lines.    Transfers Overall transfer level: Needs assistance Equipment used: Rolling walker (2 wheels) Transfers: Sit to/from Stand Sit to Stand: Supervision           General transfer comment: for safety, hand placement.    Ambulation/Gait Ambulation/Gait assistance: Min guard Gait Distance (Feet): 75 Feet Assistive device: Rolling walker (2 wheels) Gait Pattern/deviations: Decreased stride length, Step-through pattern, Trunk flexed       General Gait Details: Internal rotation of L LE. Pt reports "bad" R knee nees TKA-"too late now". Tolerated distance fairly well-fatigued after. O2 94% on RA, HR 103 bpm. Cues for safety, RW proximity, proper use of RW (rolling instead of picking up).   Stairs             Wheelchair Mobility    Modified Rankin (Stroke Patients Only)       Balance Overall balance assessment: Needs assistance         Standing balance support: Bilateral upper extremity supported, During functional activity, Reliant on assistive device for balance Standing balance-Leahy Scale: Poor                              Cognition Arousal/Alertness: Awake/alert Behavior During Therapy: WFL for tasks assessed/performed Overall Cognitive Status: Within Functional Limits for tasks assessed                                 General Comments: Some decr safety awareness noted with pt  picking up RW at times instead of sliding it.  Pt impulsive as well. [facility]        Exercises      General Comments        Pertinent Vitals/Pain Pain Assessment Pain Assessment: Faces Faces Pain Scale: Hurts even more Pain Location: "all over" Pain Descriptors / Indicators: Discomfort, Grimacing Pain Intervention(s): Limited activity within patient's tolerance, Monitored during session, Repositioned    Home Living                          Prior Function             PT Goals (current goals can now be found in the care plan section) Progress towards PT goals: Progressing toward goals    Frequency    Min 3X/week      PT Plan Current plan remains appropriate    Co-evaluation              AM-PAC PT "6 Clicks" Mobility   Outcome Measure  Help needed turning from your back to your side while in a flat bed without using bedrails?: None Help needed moving from lying on your back to sitting on the side of a flat bed without using bedrails?: None Help needed moving to and from a bed to a chair (including a wheelchair)?: A Little Help needed standing up from a chair using your arms (e.g., wheelchair or bedside chair)?: None Help needed to walk in hospital room?: A Little Help needed climbing 3-5 steps with a railing? : A Little 6 Click Score: 21    End of Session Equipment Utilized During Treatment: Gait belt Activity Tolerance: Patient tolerated treatment well Patient left: in chair;with call bell/phone within reach;with chair alarm set   PT Visit Diagnosis: Muscle weakness (generalized) (M62.81)     Time: 1610-9604 PT Time Calculation (min) (ACUTE ONLY): 20 min  Charges:  $Gait Training: 8-22 mins                         Angela Young, PT Acute Rehabilitation  Office: [phone]

## 2023-02-15 NOTE — TOC Transition Note (Signed)
Transition of Care [facility]) - CM/SW Discharge Note   Patient Details  Name: Angela Young MRN: [id number] Date of Birth: [birth year]  [facility]) CM/SW Contact:  Larrie Kass, LCSW Phone Number: 02/15/2023, 1:01 PM   Clinical Narrative:    CSW spoke with pt about Home Health recommendations. Pt has declined HH services. Pt stated she has too many people coming into her home right now. Pt reports she will contact her PCP when she ready for Home health. Pt reports her daughter will pick her up at discharge. No additional needs TOC sign off.     Final next level of care: Home/Self Care Barriers to Discharge: No Barriers Identified   Patient Goals and CMS Choice CMS Medicare.gov Compare Post Acute Care list provided to:: Patient Represenative (must comment) Choice offered to / list presented to : Adult Children  Discharge Placement                         Discharge Plan and Services Additional resources added to the After Visit Summary for     Discharge Planning Services: CM Consult Post Acute Care Choice: Home Health                               Social Determinants of Health (SDOH) Interventions SDOH Screenings   Food Insecurity: No Food Insecurity (02/13/2023)  Housing: Low Risk  (02/13/2023)  Transportation Needs: No Transportation Needs (02/13/2023)  Utilities: Not At Risk (02/13/2023)  Depression (PHQ2-9): Low Risk  (04/24/2021)  Financial Resource Strain: Low Risk  (04/13/2019)  Physical Activity: Inactive (04/13/2019)  Social Connections: Unknown (04/13/2019)  Stress: Stress Concern Present (04/13/2019)  Tobacco Use: Medium Risk (02/15/2023)     Readmission Risk Interventions     No data to display

## 2023-02-15 NOTE — Progress Notes (Signed)
Patient received discharge orders to go home. Patient was given discharge paperwork/instructions. RN went over discharge paperwork/instructions with patient. All questions/concerns were addressed and answered. Patient left the hospital stable, had discharge paperwork/instructions, and had all personal belongings.

## 2023-02-15 NOTE — Care Management Important Message (Signed)
Important Message  Patient Details IM Letter given. Name: LYRIA LEYDEN MRN: [id number] Date of Birth: 08/22/1931   Medicare Important Message Given:  Yes     Caren Macadam 02/15/2023, 12:41 PM

## 2023-02-15 NOTE — TOC Initial Note (Signed)
Transition of Care [facility]) - Initial/Assessment Note    Patient Details  Name: Angela Young MRN: [id number] Date of Birth: 02-22-1931  Transition of [facility]) CM/SW Contact:    Howell Rucks, RN Phone Number: 02/15/2023, 12:22 PM  Clinical Narrative:  DC Home with HHPT. Call to pt's dtr Angela Young) at  [phone], [facility] provider preference, left vm, await call back.                 Expected Discharge Plan: Home w Home Health Services Barriers to Discharge: No Barriers Identified   Patient Goals and CMS Choice Patient states their goals for this hospitalization and ongoing recovery are:: Home with HHPT CMS Medicare.gov Compare Post Acute Care list provided to:: Patient Represenative (must comment) Choice offered to / list presented to : Adult Children [place] ownership interest in [facility].provided to:: Adult Children    Expected Discharge Plan and Services   Discharge Planning Services: CM Consult Post Acute Care Choice: Home Health Living arrangements for the past 2 months: Single Family Home Expected Discharge Date: 02/15/23                                    Prior Living Arrangements/Services Living arrangements for the past 2 months: Single Family Home Lives with:: Adult Children Patient language and need for interpreter reviewed:: Yes Do you feel safe going back to the place where you live?: Yes      Need for Family Participation in Patient Care: Yes (Comment) Care giver support system in place?: Yes (comment)   Criminal Activity/Legal Involvement Pertinent to Current Situation/Hospitalization: No - Comment as needed  Activities of Daily Living Home Assistive Devices/Equipment: Walker (specify type) ADL Screening (condition at time of admission) Patient's cognitive ability adequate to safely complete daily activities?: Yes Is the patient deaf or have difficulty hearing?: Yes Does the patient have difficulty seeing, even when wearing  glasses/contacts?: No Does the patient have difficulty concentrating, remembering, or making decisions?: No Patient able to express need for assistance with ADLs?: Yes Does the patient have difficulty dressing or bathing?: No Independently performs ADLs?: No Communication: Independent Dressing (OT): Needs assistance Is this a change from baseline?: Pre-admission baseline Grooming: Needs assistance Is this a change from baseline?: Pre-admission baseline Feeding: Independent Bathing: Needs assistance Is this a change from baseline?: Pre-admission baseline Toileting: Needs assistance Is this a change from baseline?: Pre-admission baseline In/Out Bed: Needs assistance Is this a change from baseline?: Pre-admission baseline Walks in Home: Needs assistance Is this a change from baseline?: Pre-admission baseline Does the patient have difficulty walking or climbing stairs?: Yes Weakness of Legs: Both Weakness of Arms/Hands: None  Permission Sought/Granted Permission sought to share information with : Case Manager Permission granted to share information with : Yes, Verbal Permission Granted  Share Information with NAME: Fannie Knee, RN CM           Emotional Assessment Appearance:: Appears stated age       Alcohol / Substance Use: Not Applicable Psych Involvement: No (comment)  Admission diagnosis:  Hyponatremia [E87.1] Hypoalbuminemia [E88.09] Peripheral edema [R60.0] Acute kidney injury (nontraumatic) [N17.9] Pleural effusion on right [J90] AKI (acute kidney injury) [N17.9] Elevated AST (SGOT) [R74.01] Urinary tract infection without hematuria, site unspecified [N39.0] Patient Active Problem List   Diagnosis Date Noted   AKI (acute kidney injury) 02/12/2023   Peripheral edema 02/12/2023   Nephrolithiasis 02/12/2023  Imbalance 04/24/2021   Pain management contract signed 12/07/2019   Bilateral primary osteoarthritis of knee (left knee replacement) 11/21/2019    History of left knee replacement 11/21/2019   History of bilateral hip replacements 11/21/2019   Opioid type dependence, continuous 11/21/2019   Encounter for long-term opiate analgesic use 11/21/2019   Chronic pain syndrome 04/13/2019   Hard of hearing 04/13/2019   Osteoarthritis of both hands 10/01/2017   Anemia 04/07/2017   Paresthesia 12/13/2015   Abnormal ECG 09/09/2015   Allergic rhinitis 09/09/2015   Benign cyst of breast 09/09/2015   Body mass index (BMI) of 26.0-26.9 in adult 09/09/2015   CFIDS (chronic fatigue and immune dysfunction syndrome) 09/09/2015   MDD (major depressive disorder) 09/09/2015   Bladder cystocele 09/09/2015   Acid reflux 09/09/2015   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   History of polycythemia 09/09/2015   Vitamin D deficiency 09/09/2015   Dermatitis 09/09/2015   Chronic osteoarthritis 04/30/2015   Spondylosis without myelopathy 05/11/2006   PCP:  [facility], [facility], [facility] Pharmacy:   [facility] - [place], [place] - [address] [place] [address] Phone: [phone] Fax: [phone]     Social Determinants of Health (SDOH) Social History: SDOH Screenings   Food Insecurity: No Food Insecurity (02/13/2023)  Housing: Low Risk  (02/13/2023)  Transportation Needs: No Transportation Needs (02/13/2023)  Utilities: Not At Risk (02/13/2023)  Depression (PHQ2-9): Low Risk  (04/24/2021)  Financial Resource Strain: Low Risk  (04/13/2019)  Physical Activity: Inactive (04/13/2019)  Social Connections: Unknown (04/13/2019)  Stress: Stress Concern Present (04/13/2019)  Tobacco Use: Medium Risk (02/15/2023)   SDOH Interventions:     Readmission Risk Interventions     No data to display

## 2023-02-15 NOTE — Discharge Summary (Addendum)
Physician Discharge Summary  Angela Young SWF:[id number] DOB: [birth year] DOA: 02/11/2023  PCP: [facility]  Admit date: 02/11/2023 Discharge date: 02/15/2023  Admitted From: home Disposition:  home  Recommendations for Outpatient Follow-up:  Follow up with PCP in 1-2 weeks Follow-up with urology in 1 to 2 weeks  Home Health: PT Equipment/Devices: walker  Discharge Condition: stable CODE STATUS: Full code Diet Orders (From admission, onward)     Start     Ordered   02/14/23 1401  Diet regular Room service appropriate? Yes; Fluid consistency: Thin  Diet effective now       Question Answer Comment  Room service appropriate? Yes   Fluid consistency: Thin      02/14/23 1400            HPI: Per admitting MD, Angela Young is a 89 y.o. female with medical history significant of chronic pain syndrome. Pt presents to ED with c/o BLE edema onset and progressively worsening over past 3 days. Pt ill over past 2 weeks with N/V/D.  The GI symptoms have improved.  Still having generalized body aches.  No fevers, chills, sweats.  Having some SOB.  No CP.  Has mild cough, nonproductive. She had 1 day where she felt like there was some pain in her bladder and she took phenazopyridine, and then did not urinate for the next 2 days. She has resumed urinating.  Has been taking ibuprofen for aching. Has been drinking water.  Hospital Course / Discharge diagnoses: Principal Problem:   AKI (acute kidney injury) Active Problems:   Peripheral edema   Nephrolithiasis   Principal problem Acute kidney injury - due to obstructive 6 mm right UVJ stone.  Urology consulted as below, was given IV fluids and renal function has now improved   Active problems Obstructive 6 mm right UVJ stone, nephrolithiasis, UTI-urology consulted, she was taken to the OR 4/14 and is status post laser lithotripsy as well as a right JJ stent placement.  Urine cultures are growing 10>5 CFU's of  Enterobacter aerogenes resistant to ceftriaxone nitrofurantoin but otherwise sensitive.  She was maintained on Zosyn while hospitalized, will be transition to Cipro upon discharge for 4 additional days to complete a weeks worth  Mouth sores -continue nystatin  Anasarca, bilateral lower extremity edema, pleural effusion -due to acute kidney injury, underwent an echo that showed preserved LVEF, grade 1 diastolic dysfunction.  Swelling resolved after Lasix Hypokalemia, hypomagnesemia-improved after repletion Mildly elevated LFT - imaging showing sub-centimeter hypodensities in the liver, statistically most likely cysts or hemangiomas. Stones are noted in the gallbladder. No biliary ductal dilatation.  LFTs have now normalized   Other incidental findings on CT scan Moderate-to-large hiatal hernia. Diverticulosis without diverticulitis. Aortic atherosclerosis  Sepsis ruled out   Discharge Instructions   Allergies as of 02/15/2023       Reactions   Duloxetine Other (See Comments)   Ether    Morphine Other (See Comments)        Medication List     TAKE these medications    ciprofloxacin 500 MG tablet Commonly known as: Cipro Take 1 tablet (500 mg total) by mouth daily with breakfast for 4 days.   ibuprofen 200 MG tablet Commonly known as: ADVIL Take 200 mg by mouth every 6 (six) hours as needed for mild pain.   magic mouthwash w/lidocaine Soln Take 5 mLs by mouth 4 (four) times daily for 10 days.   nystatin 100000 UNIT/ML suspension Commonly known as:  MYCOSTATIN Take 5 mLs (500,000 Units total) by mouth 4 (four) times daily for 10 days.   tamsulosin 0.4 MG Caps capsule Commonly known as: FLOMAX Take 1 capsule (0.4 mg total) by mouth daily after supper.         Consultations: Urology   Procedures/Studies:  DG C-Arm 1-60 Min-No Report  Result Date: 02/14/2023 Fluoroscopy was utilized by the requesting physician.  No radiographic interpretation.   ECHOCARDIOGRAM  COMPLETE  Result Date: 02/12/2023    ECHOCARDIOGRAM REPORT   Patient Name:   Angela Young Date of Exam: 02/12/2023 Medical Rec #:  [id number]      Height:       62.0 in Accession #:    [id number]     Weight:       120.0 lb Date of Birth:  [birth year]     BSA:          1.539 m Patient Age:    89 years       BP:           127/70 mmHg Patient Gender: F              HR:           99 bpm. Exam Location:  Inpatient Procedure: 2D Echo, Cardiac Doppler and Color Doppler Indications:    CHF- Acute Diastolic  History:        Patient has no prior history of Echocardiogram examinations.                 Signs/Symptoms:Edema and Shortness of Breath. Chronic pain                 syndrome.  Sonographer:    Milda Smart Referring Phys: [phone] JARED M GARDNER  Sonographer Comments: Suboptimal subcostal window. Image acquisition challenging due to patient body habitus and Image acquisition challenging due to respiratory motion. IMPRESSIONS  1. Left ventricular ejection fraction, by estimation, is 60 to 65%. The left ventricle has normal function. The left ventricle has no regional wall motion abnormalities. Left ventricular diastolic parameters are consistent with Grade I diastolic dysfunction (impaired relaxation).  2. Right ventricular systolic function is normal. The right ventricular size is normal. Tricuspid regurgitation signal is inadequate for assessing PA pressure.  3. No evidence of mitral valve regurgitation.  4. The aortic valve was not well visualized. Aortic valve regurgitation is not visualized.  5. The inferior vena cava is normal in size with greater than 50% respiratory variability, suggesting right atrial pressure of 3 mmHg. FINDINGS  Left Ventricle: Left ventricular ejection fraction, by estimation, is 60 to 65%. The left ventricle has normal function. The left ventricle has no regional wall motion abnormalities. The left ventricular internal cavity size was normal in size. There is  no left ventricular  hypertrophy. Left ventricular diastolic parameters are consistent with Grade I diastolic dysfunction (impaired relaxation). Right Ventricle: The right ventricular size is normal. Right ventricular systolic function is normal. Tricuspid regurgitation signal is inadequate for assessing PA pressure. Left Atrium: Left atrial size was normal in size. Right Atrium: Right atrial size was normal in size. Pericardium: There is no evidence of pericardial effusion. Mitral Valve: There is mild calcification of the mitral valve leaflet(s). No evidence of mitral valve regurgitation. MV peak gradient, 6.4 mmHg. The mean mitral valve gradient is 3.0 mmHg. Tricuspid Valve: Tricuspid valve regurgitation is not demonstrated. Aortic Valve: The aortic valve was not well visualized. Aortic valve regurgitation is not visualized. Pulmonic Valve: Pulmonic valve  regurgitation is not visualized. Aorta: The aortic root and ascending aorta are structurally normal, with no evidence of dilitation. Venous: The inferior vena cava is normal in size with greater than 50% respiratory variability, suggesting right atrial pressure of 3 mmHg. IAS/Shunts: No atrial level shunt detected by color flow Doppler.  LEFT VENTRICLE PLAX 2D LVIDd:         3.20 cm     Diastology LVIDs:         2.00 cm     LV e' medial:    5.44 cm/s LV PW:         1.00 cm     LV E/e' medial:  13.6 LV IVS:        0.90 cm     LV e' lateral:   6.06 cm/s LVOT diam:     2.00 cm     LV E/e' lateral: 12.2 LV SV:         65 LV SV Index:   42 LVOT Area:     3.14 cm  LV Volumes (MOD) LV vol d, MOD A2C: 39.5 ml LV vol d, MOD A4C: 40.6 ml LV vol s, MOD A2C: 15.2 ml LV vol s, MOD A4C: 12.8 ml LV SV MOD A2C:     24.3 ml LV SV MOD A4C:     40.6 ml LV SV MOD BP:      28.1 ml RIGHT VENTRICLE             IVC RV S prime:     13.80 cm/s  IVC diam: 1.00 cm LEFT ATRIUM             Index        RIGHT ATRIUM           Index LA diam:        2.40 cm 1.56 cm/m   RA Area:     10.40 cm LA Vol (A2C):   45.0  ml 29.25 ml/m  RA Volume:   18.00 ml  11.70 ml/m LA Vol (A4C):   42.0 ml 27.30 ml/m LA Biplane Vol: 44.9 ml 29.18 ml/m  AORTIC VALVE LVOT Vmax:   112.00 cm/s LVOT Vmean:  80.400 cm/s LVOT VTI:    0.207 m  AORTA Ao Root diam: 3.10 cm Ao Asc diam:  3.00 cm MITRAL VALVE                TRICUSPID VALVE MV Area (PHT): 3.43 cm     TR Mean grad:   12.0 mmHg MV Area VTI:   2.66 cm     TR Vmean:       170.0 cm/s MV Peak grad:  6.4 mmHg MV Mean grad:  3.0 mmHg     SHUNTS MV Vmax:       1.26 m/s     Systemic VTI:  0.21 m MV Vmean:      88.2 cm/s    Systemic Diam: 2.00 cm MV Decel Time: 221 msec MV E velocity: 74.10 cm/s MV A velocity: 104.00 cm/s MV E/A ratio:  0.71 [place] signed by Carolan Clines Signature Date/Time: 02/12/2023/1:42:25 PM    Final    CT RENAL STONE STUDY  Result Date: 02/12/2023 CLINICAL DATA:  AKI, rule out obstruction. Recent diarrheal illness. Shortness of breath and leg swelling. EXAM: CT ABDOMEN AND PELVIS WITHOUT CONTRAST TECHNIQUE: Multidetector CT imaging of the abdomen and pelvis was performed following the standard protocol without IV contrast. RADIATION DOSE REDUCTION: This exam was  performed according to the departmental dose-optimization program which includes automated exposure control, adjustment of the mA and/or kV according to patient size and/or use of iterative reconstruction technique. COMPARISON:  None Available. FINDINGS: Lower chest: Heart is mildly enlarged and coronary artery calcifications are noted. There is a moderate to large hiatal hernia. There is a trace right pleural effusion with consolidation in the right lower lobe. Atelectasis is present at the left lung base. Hepatobiliary: Subcentimeter hypodensities are present in the liver, statistically most likely cysts or hemangiomas. Stones are noted in the gallbladder. No biliary ductal dilatation. Pancreas: Pancreatic atrophy is noted. No pancreatic ductal dilatation or surrounding inflammatory changes.  Spleen: Normal in size without focal abnormality. Adrenals/Urinary Tract: The adrenal glands are within normal limits. Nonobstructive renal calculi are noted bilaterally. There is mild-to-moderate hydroureteronephrosis on the right with a 6 mm calculus in the distal right ureter at the ureterovesicular junction, best seen on coronal image 59. No obstructive uropathy on the left. The bladder is unremarkable. Stomach/Bowel: A moderate-to-large hiatal hernia is present. Appendix appears normal. No evidence of bowel wall thickening, distention, or inflammatory changes. No free air or pneumatosis. Scattered diverticula are present along the colon without evidence of diverticulitis. Vascular/Lymphatic: Aortic atherosclerosis. No enlarged abdominal or pelvic lymph nodes. Reproductive: The uterus is not seen.  No adnexal mass. Other: No abdominopelvic ascites. Musculoskeletal: Degenerative changes are present in the thoracolumbar spine. Bilateral hip arthroplasty changes are noted. No acute osseous abnormality. IMPRESSION: 1. Mild-to-moderate obstructive uropathy on the right with a 6 mm calculus in the distal right ureter at the UVJ. 2. Bilateral nephrolithiasis. 3. Small right pleural effusion with atelectasis or infiltrate at the right lung base. 4. Cholelithiasis. 5. Moderate-to-large hiatal hernia. 6. Diverticulosis without diverticulitis. 7. Aortic atherosclerosis. Electronically Signed   By: Thornell Sartorius M.D.   On: 02/12/2023 04:36   DG Chest 2 View  Result Date: 02/11/2023 CLINICAL DATA:  Shortness of breath EXAM: CHEST - 2 VIEW COMPARISON:  None Available. FINDINGS: Small bilateral pleural effusions. Patchy airspace disease at the right base. Borderline cardiomegaly with aortic atherosclerosis. No pneumothorax. IMPRESSION: Small bilateral pleural effusions with patchy airspace disease at the right base which may be due to atelectasis or pneumonia. Borderline cardiomegaly. Electronically Signed   By: Jasmine Pang M.D.   On: 02/11/2023 17:32     Subjective: - no chest pain, shortness of breath, no abdominal pain, nausea or vomiting.   Discharge Exam: BP (!) 95/59 (BP Location: Right Arm)   Pulse 91   Temp 98 F (36.7 C) (Oral)   Resp 16   Ht  (1.575 m)   Wt 54.4 kg   SpO2 90%   BMI 21.95 kg/m   General: Pt is alert, awake, not in acute distress Cardiovascular: RRR, S1/S2 +, no rubs, no gallops Respiratory: CTA bilaterally, no wheezing, no rhonchi Abdominal: Soft, NT, ND, bowel sounds + Extremities: no edema, no cyanosis    The results of significant diagnostics from this hospitalization (including imaging, microbiology, ancillary and laboratory) are listed below for reference.     Microbiology: Recent Results (from the past 240 hour(s))  Urine Culture     Status: Abnormal   Collection Time: 02/11/23  5:01 AM   Specimen: Urine, Clean Catch  Result Value Ref Range Status   Specimen Description URINE, CLEAN CATCH  Final   Special Requests   Final    NONE Performed at [facility], [address] [place], [place] [address]  Culture >=100,000 COLONIES/mL ENTEROBACTER AEROGENES (A)  Final   Report Status 02/14/2023 FINAL  Final   Organism ID, Bacteria ENTEROBACTER AEROGENES (A)  Final      Susceptibility   Enterobacter aerogenes - MIC*    CEFEPIME <=0.12 SENSITIVE Sensitive     CEFTRIAXONE 16 RESISTANT Resistant     CIPROFLOXACIN <=0.25 SENSITIVE Sensitive     GENTAMICIN <=1 SENSITIVE Sensitive     IMIPENEM 0.5 SENSITIVE Sensitive     NITROFURANTOIN 64 INTERMEDIATE Intermediate     TRIMETH/SULFA <=20 SENSITIVE Sensitive     PIP/TAZO 16 SENSITIVE Sensitive     * >=100,000 COLONIES/mL ENTEROBACTER AEROGENES  SARS Coronavirus 2 by RT PCR (hospital order, performed in [facility] hospital lab) *cepheid single result test* Anterior Nasal Swab     Status: None   Collection Time: 02/11/23 11:30 PM   Specimen: Anterior Nasal Swab  Result Value Ref Range Status    SARS Coronavirus 2 by RT PCR NEGATIVE NEGATIVE Final    Comment: Performed at [facility], [address], [place], [place] [address]     Labs: Basic Metabolic Panel: Recent Labs  Lab 02/11/23 1630 02/13/23 0232 02/14/23 0425 02/15/23 0418  NA 132* 135 132* 133*  K 3.8 3.3* 3.7 3.6  CL 96* 98 105 101  CO2 22 23 23 24   GLUCOSE 106* 96 101* 150*  BUN 33* 37* 27* 24*  CREATININE 1.62* 1.50* 1.13* 1.26*  CALCIUM 8.6* 7.9* 7.8* 8.0*  MG  --   --  1.5* 2.0  PHOS  --   --  2.9  --    Liver Function Tests: Recent Labs  Lab 02/11/23 1630 02/13/23 0232  AST 42* 18  ALT 38 24  ALKPHOS 96 72  BILITOT 0.8 0.6  PROT 6.1* 4.8*  ALBUMIN 2.2* 1.7*   CBC: Recent Labs  Lab 02/11/23 1630 02/13/23 0232 02/14/23 0425  WBC 7.2 5.1 4.7  NEUTROABS 5.4  --  2.9  HGB 13.7 11.1* 11.1*  HCT 40.6 33.8* 34.0*  MCV 99.0 98.8 99.7  PLT 306 297 285   CBG: No results for input(s): "GLUCAP" in the last 168 hours. Hgb A1c No results for input(s): "HGBA1C" in the last 72 hours. Lipid Profile No results for input(s): "CHOL", "HDL", "LDLCALC", "TRIG", "CHOLHDL", "LDLDIRECT" in the last 72 hours. Thyroid function studies No results for input(s): "TSH", "T4TOTAL", "T3FREE", "THYROIDAB" in the last 72 hours.  Invalid input(s): "FREET3" Urinalysis    Component Value Date/Time   COLORURINE AMBER (A) 02/11/2023 1938   APPEARANCEUR CLOUDY (A) 02/11/2023 1938   LABSPEC 1.013 02/11/2023 1938   PHURINE 5.0 02/11/2023 1938   GLUCOSEU NEGATIVE 02/11/2023 1938   HGBUR MODERATE (A) 02/11/2023 1938   BILIRUBINUR NEGATIVE 02/11/2023 1938   BILIRUBINUR neg 12/13/2015 1631   KETONESUR NEGATIVE 02/11/2023 1938   PROTEINUR 100 (A) 02/11/2023 1938   UROBILINOGEN 0.2 12/13/2015 1631   UROBILINOGEN 0.2 05/01/2011 1130   NITRITE NEGATIVE 02/11/2023 1938   LEUKOCYTESUR LARGE (A) 02/11/2023 1938    FURTHER DISCHARGE INSTRUCTIONS:   Get Medicines reviewed and adjusted: Please take all your  medications with you for your next visit with your Primary MD   Laboratory/radiological data: Please request your Primary MD to go over all hospital tests and procedure/radiological results at the follow up, please ask your Primary MD to get all Hospital records sent to his/her office.   In some cases, they will be blood work, cultures and biopsy results pending at the time of  your discharge. Please request that your primary care M.D. goes through all the records of your hospital data and follows up on these results.   Also Note the following: If you experience worsening of your admission symptoms, develop shortness of breath, life threatening emergency, suicidal or homicidal thoughts you must seek medical attention immediately by calling 911 or calling your MD immediately  if symptoms less severe.   You must read complete instructions/literature along with all the possible adverse reactions/side effects for all the Medicines you take and that have been prescribed to you. Take any new Medicines after you have completely understood and accpet all the possible adverse reactions/side effects.    Do not drive when taking Pain medications or sleeping medications (Benzodaizepines)   Do not take more than prescribed Pain, Sleep and Anxiety Medications. It is not advisable to combine anxiety,sleep and pain medications without talking with your primary care practitioner   Special Instructions: If you have smoked or chewed Tobacco  in the last 2 yrs please stop smoking, stop any regular Alcohol  and or any Recreational drug use.   Wear Seat belts while driving.   Please note: You were cared for by a hospitalist during your hospital stay. Once you are discharged, your primary care physician will handle any further medical issues. Please note that NO REFILLS for any discharge medications will be authorized once you are discharged, as it is imperative that you return to your primary care physician (or  establish a relationship with a primary care physician if you do not have one) for your post hospital discharge needs so that they can reassess your need for medications and monitor your lab values.  Time coordinating discharge: 35 minutes  SIGNED:  Pamella Pert, MD, PhD 02/15/2023, 1:25 PM

## 2023-02-15 NOTE — Plan of Care (Signed)

## 2023-02-17 ENCOUNTER — Encounter: Payer: Self-pay | Admitting: Emergency Medicine

## 2023-02-17 ENCOUNTER — Emergency Department: Payer: Medicare Other

## 2023-02-17 ENCOUNTER — Emergency Department
Admission: EM | Admit: 2023-02-17 | Discharge: 2023-02-17 | Disposition: A | Discharge status: Home / Self Care | Payer: Medicare Other | Attending: Emergency Medicine | Admitting: Emergency Medicine

## 2023-02-17 ENCOUNTER — Other Ambulatory Visit: Payer: Self-pay

## 2023-02-17 DIAGNOSIS — R600 Localized edema: Secondary | ICD-10-CM | POA: Diagnosis not present

## 2023-02-17 DIAGNOSIS — R2243 Localized swelling, mass and lump, lower limb, bilateral: Secondary | ICD-10-CM | POA: Diagnosis present

## 2023-02-17 DIAGNOSIS — R8290 Unspecified abnormal findings in urine: Secondary | ICD-10-CM | POA: Diagnosis not present

## 2023-02-17 DIAGNOSIS — J90 Pleural effusion, not elsewhere classified: Secondary | ICD-10-CM | POA: Diagnosis not present

## 2023-02-17 DIAGNOSIS — I491 Atrial premature depolarization: Secondary | ICD-10-CM | POA: Diagnosis not present

## 2023-02-17 DIAGNOSIS — R0602 Shortness of breath: Secondary | ICD-10-CM | POA: Insufficient documentation

## 2023-02-17 LAB — URINALYSIS, ROUTINE W REFLEX MICROSCOPIC
Bilirubin Urine: NEGATIVE
Glucose, UA: NEGATIVE mg/dL
Ketones, ur: NEGATIVE mg/dL
Nitrite: NEGATIVE
Protein, ur: 30 mg/dL — AB
Specific Gravity, Urine: 1.014 (ref 1.005–1.030)
WBC, UA: >50 WBC/hpf (ref 0–5)
pH: 6 (ref 5.0–8.0)

## 2023-02-17 LAB — CBC WITH DIFFERENTIAL/PLATELET
Abs Immature Granulocytes: 0.14 10*3/uL — ABNORMAL HIGH (ref 0.00–0.07)
Basophils Absolute: 0 10*3/uL (ref 0.0–0.1)
Basophils Relative: 1 %
Eosinophils Absolute: 0.1 10*3/uL (ref 0.0–0.5)
Eosinophils Relative: 2 %
HCT: 37.1 % (ref 36.0–46.0)
Hemoglobin: 11.8 g/dL — ABNORMAL LOW (ref 12.0–15.0)
Immature Granulocytes: 2 %
Lymphocytes Relative: 23 %
Lymphs Abs: 1.5 10*3/uL (ref 0.7–4.0)
MCH: 31.8 pg (ref 26.0–34.0)
MCHC: 31.8 g/dL (ref 30.0–36.0)
MCV: 100 fL (ref 80.0–100.0)
Monocytes Absolute: 0.4 10*3/uL (ref 0.1–1.0)
Monocytes Relative: 7 %
Neutro Abs: 4.2 10*3/uL (ref 1.7–7.7)
Neutrophils Relative %: 65 %
Platelets: 302 10*3/uL (ref 150–400)
RBC: 3.71 MIL/uL — ABNORMAL LOW (ref 3.87–5.11)
RDW: 12.8 % (ref 11.5–15.5)
WBC: 6.4 10*3/uL (ref 4.0–10.5)
nRBC: 0 % (ref 0.0–0.2)

## 2023-02-17 LAB — MULTIPLE MYELOMA PANEL, SERUM
Albumin SerPl Elph-Mcnc: 2.2 g/dL — ABNORMAL LOW (ref 2.9–4.4)
Albumin/Glob SerPl: 0.8 (ref 0.7–1.7)
Alpha 1: 0.5 g/dL — ABNORMAL HIGH (ref 0.0–0.4)
Alpha2 Glob SerPl Elph-Mcnc: 1 g/dL (ref 0.4–1.0)
B-Globulin SerPl Elph-Mcnc: 0.7 g/dL (ref 0.7–1.3)
Gamma Glob SerPl Elph-Mcnc: 0.9 g/dL (ref 0.4–1.8)
Globulin, Total: 3 g/dL (ref 2.2–3.9)
IgA: 226 mg/dL (ref 64–422)
IgG (Immunoglobin G), Serum: 639 mg/dL (ref 586–1602)
IgM (Immunoglobulin M), Srm: 506 mg/dL — ABNORMAL HIGH (ref 26–217)
Total Protein ELP: 5.2 g/dL — ABNORMAL LOW (ref 6.0–8.5)

## 2023-02-17 LAB — COMPREHENSIVE METABOLIC PANEL
ALT: 28 U/L (ref 0–44)
AST: 34 U/L (ref 15–41)
Albumin: 2.6 g/dL — ABNORMAL LOW (ref 3.5–5.0)
Alkaline Phosphatase: 82 U/L (ref 38–126)
Anion gap: 7 (ref 5–15)
BUN: 22 mg/dL (ref 8–23)
CO2: 25 mmol/L (ref 22–32)
Calcium: 8.4 mg/dL — ABNORMAL LOW (ref 8.9–10.3)
Chloride: 106 mmol/L (ref 98–111)
Creatinine, Ser: 1.04 mg/dL — ABNORMAL HIGH (ref 0.44–1.00)
GFR, Estimated: 51 mL/min — ABNORMAL LOW (ref >60)
Glucose, Bld: 122 mg/dL — ABNORMAL HIGH (ref 70–99)
Potassium: 3.6 mmol/L (ref 3.5–5.1)
Sodium: 138 mmol/L (ref 135–145)
Total Bilirubin: 0.5 mg/dL (ref 0.3–1.2)
Total Protein: 5.8 g/dL — ABNORMAL LOW (ref 6.5–8.1)

## 2023-02-17 LAB — PROCALCITONIN: Procalcitonin: <0.1 ng/mL

## 2023-02-17 LAB — BRAIN NATRIURETIC PEPTIDE: B Natriuretic Peptide: 95.2 pg/mL (ref 0.0–100.0)

## 2023-02-17 LAB — TROPONIN I (HIGH SENSITIVITY)
Troponin I (High Sensitivity): 7 ng/L (ref <18)
Troponin I (High Sensitivity): 9 ng/L (ref <18)

## 2023-02-17 MED ORDER — FUROSEMIDE 20 MG PO TABS: 20.0000 mg | ORAL_TABLET | Freq: Every day | ORAL | 0 refills | Status: AC

## 2023-02-17 MED ORDER — FUROSEMIDE 40 MG PO TABS
20.0000 mg | ORAL_TABLET | Freq: Once | ORAL | Status: AC
  Administered 2023-02-17: 20 mg via ORAL
  Filled 2023-02-17: qty 1

## 2023-02-17 MED ORDER — POTASSIUM CHLORIDE CRYS ER 20 MEQ PO TBCR
40.0000 meq | EXTENDED_RELEASE_TABLET | Freq: Once | ORAL | Status: AC
  Administered 2023-02-17: 40 meq via ORAL
  Filled 2023-02-17: qty 2

## 2023-02-17 NOTE — ED Triage Notes (Signed)
EMS brings pt in from home for c/o swelling to LE(s), SHOB and weakness; d/c from hosp yesterday for AKI

## 2023-02-17 NOTE — ED Provider Notes (Signed)
[facility] Provider Note    Event Date/Time   First MD Initiated Contact with Patient 02/17/23 [phone]     (approximate)   History   Leg Swelling   HPI  Angela Young is a 89 y.o. female recent discharge with acute kidney injury and obstructive right UVJ stone.  Patient received laser lithotripsy.  Stent placement.  Treated with Zosyn and discharged with Cipro.  Patient had bilateral lower extremity edema, pleural effusions felt due to acute kidney injury.  Echo showed preserved EF   Patient reports that she has been having swelling in both of her lower legs since leaving the hospital.  She is also felt slightly short of breath and a bit fatigued.  She was discharged from the hospital just a day or so ago  Patient reports that she has swelling both of her legs, she tells me she came here to get Lasix and to go home.  She is able to tell me about a kidney stone that she had to have removed the stent she has been placed to urinary tract infection that she is being treated for.  She is currently taking ciprofloxacin and also dukes Magic mouthwash for thrush which seems to be clearing up  No pain or burning with urination.  No flank pain no abdominal pain.  She reports that she has slight feeling of shortness of breath when she lays down, but the notes that she has quite a bit of swelling in both of her lower legs since the time she left the hospital.  Physical Exam   Triage Vital Signs: ED Triage Vitals  Enc Vitals Group     BP 02/17/23 0216 (!) 144/100     Pulse Rate 02/17/23 0216 (!) 114     Resp 02/17/23 0216 18     Temp 02/17/23 0216 98 F (36.7 C)     Temp Source 02/17/23 0216 Oral     SpO2 02/17/23 0216 97 %     Weight 02/17/23 0216 120 lb (54.4 kg)     Height 02/17/23 0216  (1.575 m)     Head Circumference --      Peak Flow --      Pain Score 02/17/23 0215 5     Pain Loc --      Pain Edu? --      Excl. in GC? --     Most recent vital  signs: Vitals:   02/17/23 0608 02/17/23 1018  BP: (!) 143/109 (!) 149/101  Pulse: (!) 117 (!) 109  Resp: 18 18  Temp: 97.9 F (36.6 C)   SpO2: 97% 94%     General: Awake, no distress.  She is pleasant and oriented CV:  Good peripheral perfusion.  Warm well-perfused in all extremities.  Slight tachycardia heart rate 105-115 Resp:  Normal effort.  Lung sounds are clear bilaterally.  No rales or crackles.  Her work of breathing appears normal. Abd:  No distention.  Soft nontender nondistended throughout Other:  No anasarca.  She has moderate bilateral equal lower extremity pitting edema from about the mid shin down to the foot.  No erythema or skin changes.  Warm well-perfused.  No calf tenderness  I ordered an ultrasound to evaluate and exclude "blood clots" but after ordering it, the patient declined to have this study performed.  She reports her goal of coming today is just to get a fluid pill and to go home.  She is otherwise been feeling  well except for slightly fatigued.  Patient declined to allow for further assessment and understands that I was evaluating and wish to exclude blood clots in her legs which can be life-threatening, but patient reports she is certain what she needs is just a dose of Lasix and declined to have this study done.  Shared medical decision making   ED Results / Procedures / Treatments   Labs (all labs ordered are listed, but only abnormal results are displayed) Labs Reviewed  CBC WITH DIFFERENTIAL/PLATELET - Abnormal; Notable for the following components:      Result Value   RBC 3.71 (*)    Hemoglobin 11.8 (*)    Abs Immature Granulocytes 0.14 (*)    All other components within normal limits  COMPREHENSIVE METABOLIC PANEL - Abnormal; Notable for the following components:   Glucose, Bld 122 (*)    Creatinine, Ser 1.04 (*)    Calcium 8.4 (*)    Total Protein 5.8 (*)    Albumin 2.6 (*)    GFR, Estimated 51 (*)    All other components within normal  limits  URINALYSIS, ROUTINE W REFLEX MICROSCOPIC - Abnormal; Notable for the following components:   Color, Urine YELLOW (*)    APPearance CLOUDY (*)    Hgb urine dipstick MODERATE (*)    Protein, ur 30 (*)    Leukocytes,Ua LARGE (*)    Bacteria, UA RARE (*)    All other components within normal limits  URINE CULTURE  BRAIN NATRIURETIC PEPTIDE  PROCALCITONIN  TROPONIN I (HIGH SENSITIVITY)  TROPONIN I (HIGH SENSITIVITY)     EKG  And interpreted by me, performed at 2:30 AM heart rate 100 QRS 70 QTc 470 Normal sinus rhythm, to borderline tachycardia.  Probable old septal infarct.  No evidence of acute ischemia.  Appears similar in morphology to previous EKG from April 12   RADIOLOGY  DG Chest 1 View  Result Date: 02/17/2023 CLINICAL DATA:  Shortness of breath and weakness EXAM: CHEST  1 VIEW COMPARISON:  Radiographs 02/11/2023 FINDINGS: Stable cardiomediastinal silhouette. Aortic atherosclerotic calcification. Hyperinflation increased coarsening of the interstitial markings bilaterally. Redemonstrated airspace opacity in the right lower lung. Trace bilateral pleural effusions or pleural thickening. No pneumothorax. No displaced rib fractures. Remote left rib fractures. Advanced arthritis both shoulders. IMPRESSION: 1. Increased coarsening of the interstitial markings bilaterally, which could reflect mild interstitial edema or atypical infection. 2. Redemonstrated airspace opacity in the right lower lung, suspicious for pneumonia. 3. Small bilateral pleural effusions or pleural thickening. Electronically Signed   By: Minerva Fester M.D.   On: 02/17/2023 03:04      PROCEDURES:  Critical Care performed: No  Procedures   MEDICATIONS ORDERED IN ED: Medications  furosemide (LASIX) tablet 20 mg (20 mg Oral Given 02/17/23 0956)  potassium chloride SA (KLOR-CON M) CR tablet 40 mEq (40 mEq Oral Given 02/17/23 0956)     IMPRESSION / MDM / ASSESSMENT AND PLAN / ED COURSE  I reviewed the  triage vital signs and the nursing notes.                              Differential diagnosis includes, but is not limited to, possible peripheral edema, volume overload, CHF, or to the [place] potential AKI, recurrent ureteral obstruction, etc.  Patient recently hospitalized with AKI and associated nephrolithiasis.  Today's workup though is fairly reassuring with improvement in her creatinine.  GFR has improved.  Her BNP and  troponin are normal.  Her procalcitonin is low arguing against acute pulmonary infection.  Hemoglobin stable normal white count afebrile.  She is being treated for urinary tract infection.  Current antibiotic guided by previous culture.  Today her urinalysis seems to show improvement with decreased number of bacteria and she does have a known ureteral and stent in place with urology follow-up.  Small bibasilar pleural effusions noted on chest x-ray and ongoing potential infiltrate, though given the chronicity of this finding afebrile status normal white count lack of pulmonary symptoms such as cough and her normal procalcitonin this seems highly suggestive against acute bacterial pneumonia.  Patient's presentation is most consistent with acute complicated illness / injury requiring diagnostic workup.  Patient's got herself dressed, advised that she does not wish to continue ER workup at this time.  Thus far not able to reveal any acute life-threatening process.  She is slightly tachycardic but alert oriented got herself dressed and is requesting discharge and to be discharged with Lasix for leg edema.  I do not think this is totally unreasonable, I did recommend to her further testing and evaluation but she declined this including ultrasound to exclude blood clots in the lungs.  Will trial her on a short course of Lasix and recommended she get close follow-up with her primary care physician.  Patient in agreement we discussed careful return precautions, patient advising she will  have family drive her home       FINAL CLINICAL IMPRESSION(S) / ED DIAGNOSES   Final diagnoses:  Peripheral edema     Rx / DC Orders   ED Discharge Orders          Ordered    furosemide (LASIX) 20 MG tablet  Daily        02/17/23 0954             Note:  This document was prepared using Dragon voice recognition software and may include unintentional dictation errors.   Sharyn Creamer, MD 02/17/23 [phone]

## 2023-02-17 NOTE — ED Notes (Signed)
Pt given phone to call her daughter. Daughter called this RN and updated. Pt wants to leave, took down on and put on pants. RN helped pt get back into bed. Pt refusing ultrasound of legs. Provider notified via secure chat

## 2023-02-17 NOTE — ED Notes (Signed)
Pt states coming in with bilateral leg pain and swelling. Pt states she was previously admitted for this and given lasix.  Pt noted to have edema to bilateral legs

## 2023-02-17 NOTE — ED Notes (Signed)
Pt unable to urinate at this time. Specimen container placed in the back of the wheelchair. Pt made aware to let staff know when she needs to urinate so that we can assist her and collect a urine sample.

## 2023-02-17 NOTE — ED Notes (Signed)
Pt is refusing blood work. Dr. Fanny Bien notified. RN unable to get blue top for blood

## 2023-02-18 LAB — URINE CULTURE: Culture: NO GROWTH

## 2023-03-31 ENCOUNTER — Emergency Department
Admission: EM | Admit: 2023-03-31 | Discharge: 2023-03-31 | Disposition: A | Discharge status: Home / Self Care | Payer: Medicare Other | Attending: Emergency Medicine | Admitting: Emergency Medicine

## 2023-03-31 ENCOUNTER — Emergency Department: Payer: Medicare Other

## 2023-03-31 DIAGNOSIS — R55 Syncope and collapse: Secondary | ICD-10-CM | POA: Diagnosis present

## 2023-03-31 DIAGNOSIS — R0602 Shortness of breath: Secondary | ICD-10-CM | POA: Insufficient documentation

## 2023-03-31 DIAGNOSIS — R42 Dizziness and giddiness: Secondary | ICD-10-CM | POA: Insufficient documentation

## 2023-03-31 DIAGNOSIS — I471 Supraventricular tachycardia, unspecified: Secondary | ICD-10-CM | POA: Insufficient documentation

## 2023-03-31 LAB — CBC
HCT: 41 % (ref 36.0–46.0)
Hemoglobin: 13.4 g/dL (ref 12.0–15.0)
MCH: 32.3 pg (ref 26.0–34.0)
MCHC: 32.7 g/dL (ref 30.0–36.0)
MCV: 98.8 fL (ref 80.0–100.0)
Platelets: 232 10*3/uL (ref 150–400)
RBC: 4.15 MIL/uL (ref 3.87–5.11)
RDW: 13.7 % (ref 11.5–15.5)
WBC: 6.1 10*3/uL (ref 4.0–10.5)
nRBC: 0 % (ref 0.0–0.2)

## 2023-03-31 LAB — BASIC METABOLIC PANEL
Anion gap: 9 (ref 5–15)
BUN: 18 mg/dL (ref 8–23)
CO2: 20 mmol/L — ABNORMAL LOW (ref 22–32)
Calcium: 8.7 mg/dL — ABNORMAL LOW (ref 8.9–10.3)
Chloride: 110 mmol/L (ref 98–111)
Creatinine, Ser: 0.87 mg/dL (ref 0.44–1.00)
GFR, Estimated: >60 mL/min (ref >60)
Glucose, Bld: 136 mg/dL — ABNORMAL HIGH (ref 70–99)
Potassium: 3.9 mmol/L (ref 3.5–5.1)
Sodium: 139 mmol/L (ref 135–145)

## 2023-03-31 LAB — T4, FREE: Free T4: 0.9 ng/dL (ref 0.61–1.12)

## 2023-03-31 LAB — TROPONIN I (HIGH SENSITIVITY): Troponin I (High Sensitivity): 10 ng/L (ref <18)

## 2023-03-31 LAB — TSH: TSH: 1.194 u[IU]/mL (ref 0.350–4.500)

## 2023-03-31 LAB — BRAIN NATRIURETIC PEPTIDE: B Natriuretic Peptide: 132.3 pg/mL — ABNORMAL HIGH (ref 0.0–100.0)

## 2023-03-31 LAB — MAGNESIUM: Magnesium: 1.6 mg/dL — ABNORMAL LOW (ref 1.7–2.4)

## 2023-03-31 MED ORDER — SODIUM CHLORIDE 0.9 % IV BOLUS
1000.0000 mL | Freq: Once | INTRAVENOUS | Status: AC
  Administered 2023-03-31: 1000 mL via INTRAVENOUS

## 2023-03-31 MED ORDER — DOXYCYCLINE HYCLATE 100 MG PO TABS
100.0000 mg | ORAL_TABLET | Freq: Once | ORAL | Status: AC
  Administered 2023-03-31: 100 mg via ORAL
  Filled 2023-03-31: qty 1

## 2023-03-31 MED ORDER — DOXYCYCLINE HYCLATE 50 MG PO CAPS: 100.0000 mg | ORAL_CAPSULE | Freq: Two times a day (BID) | ORAL | 0 refills | Status: AC

## 2023-03-31 MED ORDER — METOPROLOL TARTRATE 25 MG PO TABS: 25.0000 mg | ORAL_TABLET | Freq: Once | ORAL | 0 refills | Status: DC

## 2023-03-31 NOTE — ED Provider Notes (Signed)
[facility] Provider Note    Event Date/Time   First MD Initiated Contact with Patient 03/31/23 1332     (approximate)   History   Fall   HPI  Angela Young is a 89 y.o. female   Past medical history of arthritis and depression who presents to the emergency department with a syncopal episode at home felt lightheaded and passed out.  No significant trauma no chest pain palpitations shortness of breath preceding the syncopal episode.  Had a mild shortness of breath review of systems.  No changes in medications no drugs or alcohol use.    Found by EMS to be in SVT in the rate 200s which converted to normal sinus rhythm in the low 100s with 6 of adenosine. Feels well now no complaints.  External Medical Documents Reviewed: Emergency department in April 2024 note for bilateral lower extremity edema, fatigue, at that time noted recent hospitalization for acute kidney injury and obstructive UVJ stone status post laser lithotripsy and earlier April 2024      Physical Exam   Triage Vital Signs: ED Triage Vitals  Enc Vitals Group     BP 03/31/23 1324 (!) 130/91     Pulse Rate 03/31/23 1324 (!) 117     Resp 03/31/23 1324 (!) 21     Temp 03/31/23 1324 98.1 F (36.7 C)     Temp Source 03/31/23 1324 Oral     SpO2 03/31/23 1324 95 %     Weight 03/31/23 1326 134 lb 6.4 oz (61 kg)     Height --      Head Circumference --      Peak Flow --      Pain Score 03/31/23 1325 0     Pain Loc --      Pain Edu? --      Excl. in GC? --     Most recent vital signs: Vitals:   03/31/23 1324  BP: (!) 130/91  Pulse: (!) 117  Resp: (!) 21  Temp: 98.1 F (36.7 C)  SpO2: 95%    General: Awake, no distress.  CV:  Good peripheral perfusion.  Resp:  Normal effort.  Abd:  No distention.  Other:  Awake comfortable appearing mild tachycardia appears euvolemic comfortable clear lungs soft nontender abdomen   ED Results / Procedures / Treatments   Labs (all labs  ordered are listed, but only abnormal results are displayed) Labs Reviewed  BASIC METABOLIC PANEL - Abnormal; Notable for the following components:      Result Value   CO2 20 (*)    Glucose, Bld 136 (*)    Calcium 8.7 (*)    All other components within normal limits  MAGNESIUM - Abnormal; Notable for the following components:   Magnesium 1.6 (*)    All other components within normal limits  BRAIN NATRIURETIC PEPTIDE - Abnormal; Notable for the following components:   B Natriuretic Peptide 132.3 (*)    All other components within normal limits  CBC  TSH  T4, FREE  TROPONIN I (HIGH SENSITIVITY)  TROPONIN I (HIGH SENSITIVITY)     I ordered and reviewed the above labs they are notable for electrolytes unremarkable, troponin normal  EKG  ED ECG REPORT I, Pilar Jarvis, the attending physician, personally viewed and interpreted this ECG.   Date: 03/31/2023  EKG Time: 1323  Rate: 114  Rhythm: sinus tachycardia  Axis: nl  Intervals:none  ST&T Change: no stemi    RADIOLOGY I  independently reviewed and interpreted see some bilateral lower lung field opacities   PROCEDURES:  Critical Care performed: No  Procedures   MEDICATIONS ORDERED IN ED: Medications  sodium chloride 0.9 % bolus 1,000 mL (0 mLs Intravenous Stopped 03/31/23 1532)  doxycycline (VIBRA-TABS) tablet 100 mg (100 mg Oral Given 03/31/23 1450)     IMPRESSION / MDM / ASSESSMENT AND PLAN / ED COURSE  I reviewed the triage vital signs and the nursing notes.                                Patient's presentation is most consistent with acute presentation with potential threat to life or bodily function.  Differential diagnosis includes, but is not limited to, SVT, dysrhythmia electrolyte disturbances respiratory infection CHF exacerbation   The patient is on the cardiac monitor to evaluate for evidence of arrhythmia and/or significant heart rate changes.  MDM: This patient with SVT converted to normal  sinus rhythm sinus tachycardia no acute medical complaints now except for some mild shortness of breath in the days preceding but completely resolved now no complaints.  She wants to go home.  Her electrolytes look normal and troponin is normal and she remains hemodynamically stable in the emergency department and her mild tachycardia resolved with fluids.  When she was mildly tachycardic with some chest x-ray findings of atypical infection and gave her doxycycline and so will complete her course for atypical pneumonia coverage.  I spoke with Dr. And of cardiology and made a referral to cardiology for follow-up with new SVT.  Advised to use vagal maneuvers and if those do not work metoprolol pill in the pocket strategy for SVT in the future.  She understands to return with any new or worsening symptoms.  I considered hospitalization for admission or observation new SVT however the patient would like to go home now and understands to come back with any new or worsening symptoms, I have referred to cardiology for follow-up.        FINAL CLINICAL IMPRESSION(S) / ED DIAGNOSES   Final diagnoses:  SVT (supraventricular tachycardia)  Syncope and collapse     Rx / DC Orders   ED Discharge Orders          Ordered    Ambulatory referral to Cardiology        03/31/23 1552    metoprolol tartrate (LOPRESSOR) 25 MG tablet   Once        03/31/23 1554    doxycycline (VIBRAMYCIN) 50 MG capsule  2 times daily        03/31/23 1602             Note:  This document was prepared using Dragon voice recognition software and may include unintentional dictation errors.    Pilar Jarvis, MD 03/31/23 [phone]

## 2023-03-31 NOTE — ED Triage Notes (Signed)
Pt sts that she got dizzy today and than fell. Per EMS, pt was in SVT when they arrived on scene. Pt was given 6mg  of Adenosine and converted. Pt since than has been resting on EMS stretcher. VS WNL. Pt has had of NS and of LR while entroute to the ED. Pt has bilateral 18g IV in the [facility].

## 2023-03-31 NOTE — Discharge Instructions (Signed)
If you have the sensation of racing heart and lightheadedness that does not get better with the vagal maneuvers that we discussed, take 1 metoprolol pill as needed which will slow your heart rate down.  I have made a referral to a cardiologist for you to follow-up with.  If you have any new, worsening or unexpected symptoms, to the emergency department for recheck.

## 2023-04-07 DIAGNOSIS — I471 Supraventricular tachycardia, unspecified: Secondary | ICD-10-CM | POA: Insufficient documentation

## 2023-04-07 NOTE — Progress Notes (Unsigned)
Primary Care Provider: [facility] Cardiologist: None Electrophysiologist: None  Clinic Note: No chief complaint on file.   ===================================  ASSESSMENT/PLAN   Problem List Items Addressed This Visit   None  ===================================  HPI:    Angela Young is a 89 y.o. female with PMH notable for CHRONIC PAIN SYNDROME, CHRONIC BLE EDEMA who is being seen today ER follow-up evaluation of recent SVT at the request of [facility]  Angela Young was last seen on ***  Recent Hospitalizations:  02/11/2023: ADMISSION visit for AKI, associated with right pleural effusion, peripheral edema => noted 3 days of worsening bilateral edema.  Not doing well over 2 weeks of nausea and vomiting.  Some dyspnea.  No chest pain.  1 day of bladder pain for which she took but has pyridine and then did not urinate for 2 days.  Was taking ibuprofen for aching. Had obstructive 6 mm right UVJ stone (nephrolithiasis) 414: Laser lithotripsy and right JJ stent.  Urine culture ++ Enterobacter aerogenes (ceftriaxone resistant) +> Zosyn, Cipro. BLE edema/anasarca/pleural effusion. => Normal EF on Echo w/ Gr 1 DD (NORMAL for AGE). => Swelling resolved with Lasix. 02/17/2023 - [facility] ER with LLE edema associated with some dyspnea.  She came in for Lasix and go home.  She declined venous Dopplers.  Given oral Lasix.  Given short course of Lasix. 03/31/2023 -[facility] ER: Presented with syncope.  Associated lightheadedness passing out.  Started to be in SVT 200 bpm => broke when IV adenosine, given 1 dose of Lopressor..  Electrolytes and troponin normal.  Hemodynamically stable.  Discharged home. => Instructed about vagal maneuvers.  She declined admission.   Reviewed  CV studies:    The following studies were reviewed today: (if available, images/films reviewed: From Epic Chart or Care Everywhere) TTE-02/11/2013: EF 60 to 65%.  No RWMA.   G1 DD (normal for age).  Normal RV.  No MR.  Normal aortic valve.  Normal RAP.  Interval History:   Angela Young   CV Review of Symptoms (Summary):  {roscv:310661}  REVIEWED OF SYSTEMS   ROS  I have reviewed and (if needed) personally updated the patient's problem list, medications, allergies, past medical and surgical history, social and family history.   PAST MEDICAL HISTORY   Past Medical History:  Diagnosis Date   Arthritis    Depression     PAST SURGICAL HISTORY   Past Surgical History:  Procedure Laterality Date   ABDOMINAL HYSTERECTOMY     BREAST LUMPECTOMY     CYSTOSCOPY/URETEROSCOPY/HOLMIUM LASER/STENT PLACEMENT Right 02/14/2023   Procedure: CYSTOSCOPY; RIGHT URETEROSCOPY; HOLMIUM LASER; BASKET STONE EXTRACTION; RIGHT URETERAL STENT PLACEMENT;  Surgeon: Malen Gauze, MD;  Location: [facility];  Service: Urology;  Laterality: Right;   EYE SURGERY     due to trauma   HIP SURGERY Right 05/13/11    total hip replacement dr Despina Hick; left 07/11/08 Dr Ernest Pine   KNEE SURGERY Left 10/10   resection of right carotid tumor     benign per pt    Immunization History  Administered Date(s) Administered   Moderna Sars-Covid-2 Vaccination 07/02/2020, 02/11/2021   Pneumococcal Conjugate-13 04/08/2018   Pneumococcal Polysaccharide-23 04/13/2019    MEDICATIONS/ALLERGIES   No outpatient medications have been marked as taking for the 04/08/23 encounter (Appointment) with Marykay Lex, MD.    Allergies  Allergen Reactions   Duloxetine Other (See Comments)   Ether    Morphine Other (See Comments)  SOCIAL HISTORY/FAMILY HISTORY   Reviewed in Epic:   Social History   Tobacco Use   Smoking status: Former    Types: Cigarettes   Smokeless tobacco: Never   Tobacco comments:    >15 years ago  Vaping Use   Vaping Use: Never used  Substance Use Topics   Alcohol use: No   Drug use: No   Social History   Social History Narrative   Not on file   Family  History  Problem Relation Age of Onset   Arthritis Mother    Anemia Mother    Stroke Mother    Arthritis Sister    Heart disease Sister    Arthritis Brother     OBJCTIVE -PE, EKG, labs   Wt Readings from Last 3 Encounters:  03/31/23 134 lb 6.4 oz (61 kg)  02/17/23 120 lb (54.4 kg)  02/11/23 120 lb (54.4 kg)    Physical Exam: There were no vitals taken for this visit. Physical Exam   Adult ECG Report  Rate: *** ;  Rhythm: {rhythm:17366};   Narrative Interpretation: ***  Recent Labs:  ***  Lab Results  Component Value Date   CHOL 200 (H) 04/13/2019   HDL 70 04/13/2019   LDLCALC 115 (H) 04/13/2019   TRIG 74 04/13/2019   CHOLHDL 2.9 04/13/2019   Lab Results  Component Value Date   CREATININE 0.87 03/31/2023   BUN 18 03/31/2023   NA 139 03/31/2023   K 3.9 03/31/2023   CL 110 03/31/2023   CO2 20 (L) 03/31/2023      Latest Ref Rng & Units 03/31/2023    1:29 PM 02/17/2023    2:21 AM 02/14/2023    4:25 AM  CBC  WBC 4.0 - 10.5 K/uL 6.1  6.4  4.7   Hemoglobin 12.0 - 15.0 g/dL 40.9  81.1  91.4   Hematocrit 36.0 - 46.0 % 41.0  37.1  34.0   Platelets 150 - 400 K/uL 232  302  285     No results found for: "HGBA1C" Lab Results  Component Value Date   TSH 1.194 03/31/2023    ================================================== I spent a total of *** minutes with the patient spent in direct patient consultation.  Additional time spent with chart review  / charting (studies, outside notes, etc): *** min Total Time: *** min  Current medicines are reviewed at length with the patient today.  (+/- concerns) ***  Notice: This dictation was prepared with Dragon dictation along with smart phrase technology. Any transcriptional errors that result from this process are unintentional and may not be corrected upon review.   Studies Ordered:  No orders of the defined types were placed in this encounter.  No orders of the defined types were placed in this encounter.   Patient  Instructions / Medication Changes & Studies & Tests Ordered   There are no Patient Instructions on file for this visit.     Marykay Lex, MD, MS Bryan Lemma, M.D., M.S. Interventional Cardiologist  [facility]   [address]; [address] [place], [place]  [address] [phone]           Fax [phone]    Thank you for choosing [facility] in [place] [facility] [place]!!

## 2023-04-07 NOTE — Assessment & Plan Note (Addendum)
1 episode of documented SVT with syncope. Discussed with EP -> questions benefit of short-term Zio patch monitor to see if she has additional asymptomatic episodes..  At this point I think we can consider initiating low-dose beta-blocker, ensuring adequate p.o. intake.  Reviewed vagal maneuvers.  We had a long talk about management.  Most important thing is hydration and avoiding triggers.  She is felt better and not had any syncope symptoms.  No dizziness since starting her caffeine free plan.  I would like to start low-dose Toprol 12.5 mg nightly for baseline suppression of ectopy and AV nodal blockade.  I gave her brief explanation of SVT pathophysiology and coronary electrical conduction.  We discussed vagal maneuvers such as vigorous coughing and Valsalva as well as drinking cold water.  Most importantly she needs to lay down if possible on her recliner which she sleeps on.  If she is driving she is get out the road carefully.  Usually there is enough time that she can react.  We talked about cold water.  I do not know that we would have any benefit from a monitor at this point since she is the likely having recurrence, however if there is recurrence I think EP referral will be warranted for either ILR or SVT ablation.  She really does not want to take a medication, I think a low-dose beta-blocker should help.

## 2023-04-08 ENCOUNTER — Encounter: Payer: Self-pay | Admitting: Cardiology

## 2023-04-08 ENCOUNTER — Ambulatory Visit: Payer: Medicare Other | Attending: Cardiology | Admitting: Cardiology

## 2023-04-08 VITALS — BP 164/93 | HR 86 | Ht 62.5 in | Wt 122.2 lb

## 2023-04-08 DIAGNOSIS — R55 Syncope and collapse: Secondary | ICD-10-CM | POA: Diagnosis not present

## 2023-04-08 DIAGNOSIS — I471 Supraventricular tachycardia, unspecified: Secondary | ICD-10-CM

## 2023-04-08 DIAGNOSIS — R600 Localized edema: Secondary | ICD-10-CM

## 2023-04-08 MED ORDER — METOPROLOL SUCCINATE ER 25 MG PO TB24: 12.5000 mg | ORAL_TABLET | Freq: Every day | ORAL | 3 refills | Status: AC

## 2023-04-08 NOTE — Assessment & Plan Note (Signed)
This was her first time ever having a syncopal episode.  More than likely she syncopized because of SVT, but usually when that happens the patients tend to convert back to sinus rhythm.  Very unusual that when she was in SVT post syncope.  This raises a question that perhaps she had syncope for different reason.  Where she did have recurrence, I would probably consider EP referral for possible loop recorder versus SVT ablation.  For now my recommendation be to avoid caffeine and other triggers like chocolates and sweets.  Stay adequately hydrated drinking at least 60 to 70 ounces of water a day, and we will prescribe low-dose Toprol to hopefully prevent SVT.  We also talked of the importance of when the prodrome of dizziness and lightheadedness weakness comes on she needs to lay down, if she is out about doing things she needs to sit down and put her feet up.  If she is driving a car for is that she needs to pull off the side of the road in a safe manner and sit until she feels better.  She should probably call someone to come get her.  Based on the fact that we have not running diagnosis for her syncope, I do not know that she meets criteria for no driving for 6 months.

## 2023-04-08 NOTE — Patient Instructions (Signed)
Medication Instructions:  Your physician has recommended you make the following change in your medication:   START - metoprolol succinate (TOPROL XL) 25 MG 24 hr tablet - Take 0.5 tablets (12.5 mg total) by mouth daily. At bedtime   STOP - metoprolol tartrate (LOPRESSOR) 25 MG tablet  *If you need a refill on your cardiac medications before your next appointment, please call your pharmacy*  Lab Work: -None ordered If you have labs (blood work) drawn today and your tests are completely normal, you will receive your results only by: MyChart Message (if you have MyChart) OR A paper copy in the mail If you have any lab test that is abnormal or we need to change your treatment, we will call you to review the results.  Testing/Procedures: -None ordered  Follow-Up: At [facility], you and your health needs are our priority.  As part of our continuing mission to provide you with exceptional heart care, we have created designated Provider Care Teams.  These Care Teams include your primary Cardiologist (physician) and Advanced Practice Providers (APPs -  Physician Assistants and Nurse Practitioners) who all work together to provide you with the care you need, when you need it.  Your next appointment:   2 month(s)  Provider:   Bryan Lemma, MD    Other Instructions -Hydrate with 60 - 70 ounces of fluid daily

## 2023-04-12 NOTE — Addendum Note (Signed)
Addended by: Auburn Bilberry D on: 04/12/2023 10:00 AM   Modules accepted: Orders

## 2023-06-23 NOTE — Progress Notes (Deleted)
Cardiology Office Note:  .   Date:  06/23/2023  ID:  Angela Young, DOB August 13, 1931, MRN [id number] PCP: [facility] Providers Cardiologist:  None { Click to update primary MD,subspecialty MD or APP then REFRESH:1}   History of Present Illness: Marland Kitchen   Angela Young is a 89 y.o. female with past medical history of syncope associated with SVT, chronic pain syndrome, chronic BLE edema. She is followed by Dr. Herbie Baltimore, she presents today for follow up regarding SVT.   On 03/31/2023 she presented to [facility] emergency department following an episode of syncope.  She was found to be in SVT at 200 bpm, was broken with IV adenosine she was also given Lopressor.  Her electrolytes and troponin were normal as she was hemodynamically stable she was discharged home, she had declined admission.  She was evaluated by Dr. Herbie Baltimore on 04/08/2023.  She described episode leading to her recent ER visit, she stated that she started to feel lightheaded and dizzy and must have passed out.  Following EMS arrival she was found to be in VT broken with adenosine.  Denied feeling palpitations prior to or following the event.  Any further events, felt that it was likely related to drinking several cups of coffee earlier that day.  She was started on Topril 12.5mg  nightly and encouraged to avoid triggers such as increased caffeine.  She was also instructed to maintain good hydration. Dr. Herbie Baltimore reviewed with EP, did not feel that short term Zio patch would be beneficial. If she had recurrence of syncope he recommended referral to EP for ILR or SVT ablation.   Of note she had admission to [facility] in 02/2023 with AKI due to obstructive 6 mm right UVJ stone, she underwent lithotripsy as well as stent placement. She had bilateral lower extremity edema as well as pleural effusions due to acute kidney injury. She had an echo at that time that showed LVED of 60 to 65%, LV with normal function, no RWMA and  G1DD.  Today she presents for follow up. She reports she has been doing well   SVT: Following syncopal event was found to be in SVT to 200 bpm by EMS, was broken with IV adenosine. She was not cardiac aware, only felt a slight dizziness. Dr. Herbie Baltimore recommended referral to EP for ILR or SVT ablation if recurrence, did not feel short ZIO monitor would be beneficial, she had stopped intake of caffeine after event with no further episodes of dizziness.   Syncope: She had an episode on syncope in 03/2023, following was found to be in SVT. Dr. Herbie Baltimore questioned if etiology was SVT. For recurrence recommends referral to EP for ILR versus ablation as noted above.  Today denies any further syncopal episodes  Recommended to avoid caffeine, stay well hydrated with at least 60 to 70 ounces of water a day.   ROS: ****** Today she denies chest pain, shortness of breath, lower extremity edema, fatigue, palpitations, melena, hematuria, hemoptysis, diaphoresis, weakness, presyncope, syncope, orthopnea, and PND.   Studies Reviewed: .       Cardiac Studies & Procedures       ECHOCARDIOGRAM  ECHOCARDIOGRAM COMPLETE 02/12/2023  Narrative ECHOCARDIOGRAM REPORT    Patient Name:   Angela Young Date of Exam: 02/12/2023 Medical Rec #:  [id number]      Height:       62.0 in Accession #:    [id number]     Weight:  120.0 lb Date of Birth:  [birth year]     BSA:          1.539 m Patient Age:    89 years       BP:           127/70 mmHg Patient Gender: F              HR:           99 bpm. Exam Location:  Inpatient  Procedure: 2D Echo, Cardiac Doppler and Color Doppler  Indications:    CHF- Acute Diastolic  History:        Patient has no prior history of Echocardiogram examinations. Signs/Symptoms:Edema and Shortness of Breath. Chronic pain syndrome.  Sonographer:    Milda Smart Referring Phys: [id number] JARED M GARDNER   Sonographer Comments: Suboptimal subcostal window. Image acquisition  challenging due to patient body habitus and Image acquisition challenging due to respiratory motion. IMPRESSIONS   1. Left ventricular ejection fraction, by estimation, is 60 to 65%. The left ventricle has normal function. The left ventricle has no regional wall motion abnormalities. Left ventricular diastolic parameters are consistent with Grade I diastolic dysfunction (impaired relaxation). 2. Right ventricular systolic function is normal. The right ventricular size is normal. Tricuspid regurgitation signal is inadequate for assessing PA pressure. 3. No evidence of mitral valve regurgitation. 4. The aortic valve was not well visualized. Aortic valve regurgitation is not visualized. 5. The inferior vena cava is normal in size with greater than 50% respiratory variability, suggesting right atrial pressure of 3 mmHg.  FINDINGS Left Ventricle: Left ventricular ejection fraction, by estimation, is 60 to 65%. The left ventricle has normal function. The left ventricle has no regional wall motion abnormalities. The left ventricular internal cavity size was normal in size. There is no left ventricular hypertrophy. Left ventricular diastolic parameters are consistent with Grade I diastolic dysfunction (impaired relaxation).  Right Ventricle: The right ventricular size is normal. Right ventricular systolic function is normal. Tricuspid regurgitation signal is inadequate for assessing PA pressure.  Left Atrium: Left atrial size was normal in size.  Right Atrium: Right atrial size was normal in size.  Pericardium: There is no evidence of pericardial effusion.  Mitral Valve: There is mild calcification of the mitral valve leaflet(s). No evidence of mitral valve regurgitation. MV peak gradient, 6.4 mmHg. The mean mitral valve gradient is 3.0 mmHg.  Tricuspid Valve: Tricuspid valve regurgitation is not demonstrated.  Aortic Valve: The aortic valve was not well visualized. Aortic valve regurgitation is  not visualized.  Pulmonic Valve: Pulmonic valve regurgitation is not visualized.  Aorta: The aortic root and ascending aorta are structurally normal, with no evidence of dilitation.  Venous: The inferior vena cava is normal in size with greater than 50% respiratory variability, suggesting right atrial pressure of 3 mmHg.  IAS/Shunts: No atrial level shunt detected by color flow Doppler.   LEFT VENTRICLE PLAX 2D LVIDd:         3.20 cm     Diastology LVIDs:         2.00 cm     LV e' medial:    5.44 cm/s LV PW:         1.00 cm     LV E/e' medial:  13.6 LV IVS:        0.90 cm     LV e' lateral:   6.06 cm/s LVOT diam:     2.00 cm     LV E/e' lateral: 12.2 LV  SV:         65 LV SV Index:   42 LVOT Area:     3.14 cm  LV Volumes (MOD) LV vol d, MOD A2C: 39.5 ml LV vol d, MOD A4C: 40.6 ml LV vol s, MOD A2C: 15.2 ml LV vol s, MOD A4C: 12.8 ml LV SV MOD A2C:     24.3 ml LV SV MOD A4C:     40.6 ml LV SV MOD BP:      28.1 ml  RIGHT VENTRICLE             IVC RV S prime:     13.80 cm/s  IVC diam: 1.00 cm  LEFT ATRIUM             Index        RIGHT ATRIUM           Index LA diam:        2.40 cm 1.56 cm/m   RA Area:     10.40 cm LA Vol (A2C):   45.0 ml 29.25 ml/m  RA Volume:   18.00 ml  11.70 ml/m LA Vol (A4C):   42.0 ml 27.30 ml/m LA Biplane Vol: 44.9 ml 29.18 ml/m AORTIC VALVE LVOT Vmax:   112.00 cm/s LVOT Vmean:  80.400 cm/s LVOT VTI:    0.207 m  AORTA Ao Root diam: 3.10 cm Ao Asc diam:  3.00 cm  MITRAL VALVE                TRICUSPID VALVE MV Area (PHT): 3.43 cm     TR Mean grad:   12.0 mmHg MV Area VTI:   2.66 cm     TR Vmean:       170.0 cm/s MV Peak grad:  6.4 mmHg MV Mean grad:  3.0 mmHg     SHUNTS MV Vmax:       1.26 m/s     Systemic VTI:  0.21 m MV Vmean:      88.2 cm/s    Systemic Diam: 2.00 cm MV Decel Time: 221 msec MV E velocity: 74.10 cm/s MV A velocity: 104.00 cm/s MV E/A ratio:  0.71  Photographer signed by Carolan Clines Signature  Date/Time: 02/12/2023/1:42:25 PM    Final             *** Risk Assessment/Calculations:   {Does this patient have ATRIAL FIBRILLATION?:[phone]} No BP recorded.  {Refresh Note OR Click here to enter BP  :1}***       Physical Exam:   VS:  There were no vitals taken for this visit.   Wt Readings from Last 3 Encounters:  04/08/23 122 lb 3.2 oz (55.4 kg)  03/31/23 134 lb 6.4 oz (61 kg)  02/17/23 120 lb (54.4 kg)    GEN: Well nourished, well developed in no acute distress NECK: No JVD; No carotid bruits CARDIAC: ***RRR, no murmurs, rubs, gallops RESPIRATORY:  Clear to auscultation without rales, wheezing or rhonchi  ABDOMEN: Soft, non-tender, non-distended EXTREMITIES:  No edema; No deformity   ASSESSMENT AND PLAN: .   ***    {Are you ordering a CV Procedure (e.g. stress test, cath, DCCV, TEE, etc)?   Press F2        :[id number]}  Dispo: ***  Signed, Rip Harbour, NP

## 2023-06-24 ENCOUNTER — Ambulatory Visit: Payer: Medicare Other | Attending: Cardiology | Admitting: Cardiology

## 2023-06-24 DIAGNOSIS — I471 Supraventricular tachycardia, unspecified: Secondary | ICD-10-CM

## 2023-06-24 DIAGNOSIS — R55 Syncope and collapse: Secondary | ICD-10-CM

## 2023-06-25 ENCOUNTER — Encounter: Payer: Self-pay | Admitting: Cardiology

## 2023-06-29 ENCOUNTER — Telehealth: Payer: Self-pay | Admitting: *Deleted

## 2023-06-29 NOTE — Telephone Encounter (Signed)
   Pre-operative Risk Assessment    Patient Name: Angela Young  DOB: 23-Jul-1931 MRN: [id number]    DATE OF LAST VISIT: 04/08/23 DR. HARDING DATE OF NEXT VISIT: NONE  PER CLEARANCE FORM NOTES: PT NOTED RECENT WORK UP FOR UNKNOWN TACHYCARDIA. PT STATED LAST EPISODE LAST MONTH. PT IS UNAWARE OF MEDICATIONS AND IS A DIFFICULT HISTORIAN. IF YOU COULD PLEASE SEND LAST OV NOTE WITH CLEARANCE.   Request for Surgical Clearance    Procedure:   CATARACT EXTRACTION BY PE, IOL-LEFT EYE  Date of Surgery:  Clearance 07/08/23                                 Surgeon:  DR. MINCEY Surgeon's Group or Practice Name:  [facility] Phone number:  [phone] EXT [phone] Fax number:  [phone]   Type of Clearance Requested:   - Medical ; PER CLEARANCE FORM NO MEDICATIONS LISTED AS NEEDING TO BE HELD   Type of Anesthesia:   IV SEDATION   Additional requests/questions:    Angela Young   06/29/2023, 3:28 PM

## 2023-06-30 NOTE — Telephone Encounter (Signed)
   Patient Name: Angela Young  DOB: 1931-01-26 MRN: [id number]  Primary Cardiologist: None  Chart reviewed as part of pre-operative protocol coverage. Cataract extractions are recognized in guidelines as low risk surgeries that do not typically require specific preoperative testing or holding of blood thinner therapy. Therefore, given past medical history and time since last visit, based on ACC/AHA guidelines, Angela Young would be at acceptable risk for the planned procedure without further cardiovascular testing.   I will route this recommendation to the requesting party via Epic fax function and remove from pre-op pool.  Please call with questions.  Carlos Levering, NP 06/30/2023, 8:29 AM
